# Patient Record
Sex: Male | Born: 1950 | Race: White | Hispanic: No | Smoking: Never smoker
Health system: Southern US, Community
[De-identification: ages and names within clinical notes are randomized; demographics above are authoritative.]

## PROBLEM LIST (undated history)

## (undated) DIAGNOSIS — I509 Heart failure, unspecified: Secondary | ICD-10-CM

## (undated) DIAGNOSIS — N62 Hypertrophy of breast: Secondary | ICD-10-CM

## (undated) DIAGNOSIS — R945 Abnormal results of liver function studies: Secondary | ICD-10-CM

## (undated) DIAGNOSIS — M7580 Other shoulder lesions, unspecified shoulder: Secondary | ICD-10-CM

## (undated) DIAGNOSIS — H269 Unspecified cataract: Secondary | ICD-10-CM

## (undated) DIAGNOSIS — M1710 Unilateral primary osteoarthritis, unspecified knee: Secondary | ICD-10-CM

## (undated) DIAGNOSIS — T7840XA Allergy, unspecified, initial encounter: Secondary | ICD-10-CM

## (undated) DIAGNOSIS — K219 Gastro-esophageal reflux disease without esophagitis: Secondary | ICD-10-CM

## (undated) DIAGNOSIS — R7989 Other specified abnormal findings of blood chemistry: Secondary | ICD-10-CM

## (undated) DIAGNOSIS — E781 Pure hyperglyceridemia: Secondary | ICD-10-CM

## (undated) DIAGNOSIS — F419 Anxiety disorder, unspecified: Secondary | ICD-10-CM

## (undated) DIAGNOSIS — Z9889 Other specified postprocedural states: Secondary | ICD-10-CM

## (undated) DIAGNOSIS — J45909 Unspecified asthma, uncomplicated: Secondary | ICD-10-CM

## (undated) DIAGNOSIS — M179 Osteoarthritis of knee, unspecified: Secondary | ICD-10-CM

## (undated) DIAGNOSIS — G4730 Sleep apnea, unspecified: Secondary | ICD-10-CM

## (undated) DIAGNOSIS — I428 Other cardiomyopathies: Secondary | ICD-10-CM

## (undated) DIAGNOSIS — I10 Essential (primary) hypertension: Secondary | ICD-10-CM

## (undated) DIAGNOSIS — N189 Chronic kidney disease, unspecified: Secondary | ICD-10-CM

## (undated) DIAGNOSIS — K5790 Diverticulosis of intestine, part unspecified, without perforation or abscess without bleeding: Secondary | ICD-10-CM

## (undated) HISTORY — DX: Essential (primary) hypertension: I10

## (undated) HISTORY — DX: Unspecified cataract: H26.9

## (undated) HISTORY — DX: Unspecified asthma, uncomplicated: J45.909

## (undated) HISTORY — DX: Allergy, unspecified, initial encounter: T78.40XA

## (undated) HISTORY — DX: Pure hyperglyceridemia: E78.1

## (undated) HISTORY — DX: Abnormal results of liver function studies: R94.5

## (undated) HISTORY — PX: PTCA: SHX146

## (undated) HISTORY — DX: Other specified postprocedural states: Z98.890

## (undated) HISTORY — PX: KNEE ARTHROSCOPY: SUR90

## (undated) HISTORY — PX: MEDIAL PARTIAL KNEE REPLACEMENT: SHX5965

## (undated) HISTORY — DX: Other shoulder lesions, unspecified shoulder: M75.80

## (undated) HISTORY — DX: Gastro-esophageal reflux disease without esophagitis: K21.9

## (undated) HISTORY — DX: Unilateral primary osteoarthritis, unspecified knee: M17.10

## (undated) HISTORY — DX: Chronic kidney disease, unspecified: N18.9

## (undated) HISTORY — PX: ROTATOR CUFF REPAIR: SHX139

## (undated) HISTORY — DX: Hypertrophy of breast: N62

## (undated) HISTORY — DX: Other specified abnormal findings of blood chemistry: R79.89

## (undated) HISTORY — DX: Diverticulosis of intestine, part unspecified, without perforation or abscess without bleeding: K57.90

## (undated) HISTORY — DX: Other cardiomyopathies: I42.8

## (undated) HISTORY — DX: Osteoarthritis of knee, unspecified: M17.9

---

## 1999-03-19 ENCOUNTER — Encounter: Payer: Self-pay | Admitting: Family Medicine

## 1999-03-19 ENCOUNTER — Emergency Department (HOSPITAL_COMMUNITY): Admission: EM | Admit: 1999-03-19 | Discharge: 1999-03-19 | Payer: Self-pay | Admitting: Family Medicine

## 2002-02-13 ENCOUNTER — Emergency Department (HOSPITAL_COMMUNITY): Admission: EM | Admit: 2002-02-13 | Discharge: 2002-02-14 | Payer: Self-pay | Admitting: Emergency Medicine

## 2002-02-14 ENCOUNTER — Encounter: Payer: Self-pay | Admitting: Emergency Medicine

## 2002-06-21 ENCOUNTER — Inpatient Hospital Stay (HOSPITAL_COMMUNITY): Admission: EM | Admit: 2002-06-21 | Discharge: 2002-06-24 | Payer: Self-pay | Admitting: Emergency Medicine

## 2002-06-21 ENCOUNTER — Encounter: Payer: Self-pay | Admitting: Cardiology

## 2003-05-21 ENCOUNTER — Ambulatory Visit (HOSPITAL_BASED_OUTPATIENT_CLINIC_OR_DEPARTMENT_OTHER): Admission: RE | Admit: 2003-05-21 | Discharge: 2003-05-21 | Payer: Self-pay | Admitting: Orthopedic Surgery

## 2003-07-04 ENCOUNTER — Emergency Department (HOSPITAL_COMMUNITY): Admission: EM | Admit: 2003-07-04 | Discharge: 2003-07-04 | Payer: Self-pay | Admitting: Emergency Medicine

## 2004-02-10 ENCOUNTER — Ambulatory Visit: Payer: Self-pay | Admitting: Endocrinology

## 2004-06-23 ENCOUNTER — Ambulatory Visit: Payer: Self-pay | Admitting: Endocrinology

## 2004-08-18 ENCOUNTER — Ambulatory Visit: Payer: Self-pay | Admitting: Internal Medicine

## 2004-09-16 ENCOUNTER — Ambulatory Visit: Payer: Self-pay | Admitting: Cardiology

## 2004-09-21 ENCOUNTER — Ambulatory Visit: Payer: Self-pay | Admitting: Internal Medicine

## 2004-10-03 ENCOUNTER — Ambulatory Visit: Payer: Self-pay

## 2005-08-30 ENCOUNTER — Ambulatory Visit: Payer: Self-pay | Admitting: Cardiology

## 2005-09-13 ENCOUNTER — Ambulatory Visit (HOSPITAL_COMMUNITY): Admission: RE | Admit: 2005-09-13 | Discharge: 2005-09-14 | Payer: Self-pay | Admitting: Orthopedic Surgery

## 2005-12-05 ENCOUNTER — Ambulatory Visit: Payer: Self-pay | Admitting: Cardiology

## 2005-12-15 ENCOUNTER — Ambulatory Visit: Payer: Self-pay

## 2005-12-15 ENCOUNTER — Encounter: Payer: Self-pay | Admitting: Cardiology

## 2005-12-15 HISTORY — PX: OTHER SURGICAL HISTORY: SHX169

## 2006-01-02 ENCOUNTER — Ambulatory Visit: Payer: Self-pay | Admitting: Cardiology

## 2006-06-12 ENCOUNTER — Ambulatory Visit: Payer: Self-pay | Admitting: Endocrinology

## 2006-06-21 ENCOUNTER — Ambulatory Visit: Payer: Self-pay | Admitting: Cardiology

## 2006-07-13 ENCOUNTER — Ambulatory Visit: Payer: Self-pay

## 2006-07-13 ENCOUNTER — Encounter: Payer: Self-pay | Admitting: Cardiology

## 2006-07-13 HISTORY — PX: TRANSTHORACIC ECHOCARDIOGRAM: SHX275

## 2006-07-30 ENCOUNTER — Ambulatory Visit: Payer: Self-pay | Admitting: Cardiology

## 2006-08-08 ENCOUNTER — Ambulatory Visit: Payer: Self-pay | Admitting: Internal Medicine

## 2006-08-09 ENCOUNTER — Encounter: Payer: Self-pay | Admitting: Internal Medicine

## 2006-09-20 ENCOUNTER — Ambulatory Visit: Payer: Self-pay | Admitting: Cardiology

## 2006-09-20 HISTORY — PX: ELECTROCARDIOGRAM: SHX264

## 2006-10-17 ENCOUNTER — Ambulatory Visit: Payer: Self-pay | Admitting: Cardiology

## 2006-12-25 ENCOUNTER — Encounter: Payer: Self-pay | Admitting: Endocrinology

## 2006-12-25 DIAGNOSIS — R7309 Other abnormal glucose: Secondary | ICD-10-CM

## 2006-12-25 DIAGNOSIS — E785 Hyperlipidemia, unspecified: Secondary | ICD-10-CM

## 2006-12-25 DIAGNOSIS — Z87442 Personal history of urinary calculi: Secondary | ICD-10-CM

## 2006-12-25 DIAGNOSIS — J309 Allergic rhinitis, unspecified: Secondary | ICD-10-CM | POA: Insufficient documentation

## 2006-12-25 DIAGNOSIS — F1020 Alcohol dependence, uncomplicated: Secondary | ICD-10-CM

## 2007-02-20 ENCOUNTER — Ambulatory Visit: Payer: Self-pay | Admitting: Cardiology

## 2007-02-20 LAB — CONVERTED CEMR LAB
Chloride: 108 meq/L (ref 96–112)
Creatinine, Ser: 1.3 mg/dL (ref 0.4–1.5)
Glucose, Bld: 100 mg/dL — ABNORMAL HIGH (ref 70–99)
Sodium: 141 meq/L (ref 135–145)

## 2007-02-27 ENCOUNTER — Ambulatory Visit: Payer: Self-pay

## 2007-03-13 ENCOUNTER — Ambulatory Visit: Payer: Self-pay | Admitting: Cardiology

## 2007-09-01 ENCOUNTER — Emergency Department (HOSPITAL_COMMUNITY): Admission: EM | Admit: 2007-09-01 | Discharge: 2007-09-01 | Payer: Self-pay | Admitting: Emergency Medicine

## 2007-09-12 ENCOUNTER — Encounter: Admission: RE | Admit: 2007-09-12 | Discharge: 2007-09-12 | Payer: Self-pay | Admitting: Sports Medicine

## 2007-11-19 ENCOUNTER — Ambulatory Visit: Payer: Self-pay | Admitting: Cardiology

## 2008-03-03 ENCOUNTER — Ambulatory Visit: Payer: Self-pay | Admitting: Cardiology

## 2008-03-11 DIAGNOSIS — I428 Other cardiomyopathies: Secondary | ICD-10-CM

## 2008-03-11 DIAGNOSIS — I10 Essential (primary) hypertension: Secondary | ICD-10-CM | POA: Insufficient documentation

## 2008-03-17 ENCOUNTER — Ambulatory Visit: Payer: Self-pay | Admitting: Cardiology

## 2008-03-17 LAB — CONVERTED CEMR LAB
AST: 18 units/L (ref 0–37)
Alkaline Phosphatase: 53 units/L (ref 39–117)
Chloride: 106 meq/L (ref 96–112)
Direct LDL: 143.4 mg/dL
Ferritin: 137.5 ng/mL (ref 22.0–322.0)
GFR calc non Af Amer: 82 mL/min
Potassium: 4.2 meq/L (ref 3.5–5.1)
Total Bilirubin: 0.9 mg/dL (ref 0.3–1.2)
Total CHOL/HDL Ratio: 6.3
VLDL: 37 mg/dL (ref 0–40)

## 2008-12-15 ENCOUNTER — Encounter: Payer: Self-pay | Admitting: Cardiology

## 2008-12-29 ENCOUNTER — Ambulatory Visit: Payer: Self-pay | Admitting: Cardiology

## 2008-12-29 DIAGNOSIS — R079 Chest pain, unspecified: Secondary | ICD-10-CM

## 2008-12-29 DIAGNOSIS — E781 Pure hyperglyceridemia: Secondary | ICD-10-CM

## 2009-01-01 LAB — CONVERTED CEMR LAB
Albumin: 3.9 g/dL (ref 3.5–5.2)
Alkaline Phosphatase: 69 units/L (ref 39–117)
BUN: 13 mg/dL (ref 6–23)
Cholesterol: 228 mg/dL — ABNORMAL HIGH (ref 0–200)
GFR calc non Af Amer: 106.2 mL/min (ref >60)
Potassium: 4 meq/L (ref 3.5–5.1)
Sodium: 136 meq/L (ref 135–145)
Total Protein: 7.1 g/dL (ref 6.0–8.3)
VLDL: 294 mg/dL — ABNORMAL HIGH (ref 0.0–40.0)

## 2009-01-06 ENCOUNTER — Ambulatory Visit: Payer: Self-pay

## 2009-01-06 ENCOUNTER — Encounter: Payer: Self-pay | Admitting: Cardiology

## 2009-01-11 ENCOUNTER — Telehealth (INDEPENDENT_AMBULATORY_CARE_PROVIDER_SITE_OTHER): Payer: Self-pay | Admitting: *Deleted

## 2009-01-12 ENCOUNTER — Ambulatory Visit: Payer: Self-pay | Admitting: Cardiology

## 2009-01-12 LAB — CONVERTED CEMR LAB
BUN: 12 mg/dL (ref 6–23)
Calcium: 8.9 mg/dL (ref 8.4–10.5)
Creatinine, Ser: 1.3 mg/dL (ref 0.4–1.5)
GFR calc non Af Amer: 60.64 mL/min (ref >60)
Potassium: 4.3 meq/L (ref 3.5–5.1)

## 2009-01-14 ENCOUNTER — Encounter: Payer: Self-pay | Admitting: Cardiology

## 2009-02-10 ENCOUNTER — Telehealth: Payer: Self-pay | Admitting: Cardiology

## 2009-03-08 ENCOUNTER — Encounter: Payer: Self-pay | Admitting: Cardiology

## 2009-05-11 ENCOUNTER — Ambulatory Visit: Payer: Self-pay | Admitting: Cardiology

## 2009-05-11 DIAGNOSIS — R05 Cough: Secondary | ICD-10-CM

## 2009-05-11 DIAGNOSIS — R059 Cough, unspecified: Secondary | ICD-10-CM | POA: Insufficient documentation

## 2009-05-12 ENCOUNTER — Telehealth: Payer: Self-pay | Admitting: Cardiology

## 2009-05-12 LAB — CONVERTED CEMR LAB
Basophils Absolute: 0 10*3/uL (ref 0.0–0.1)
Hemoglobin: 14 g/dL (ref 13.0–17.0)
Lymphocytes Relative: 40.1 % (ref 12.0–46.0)
Monocytes Relative: 10.2 % (ref 3.0–12.0)
Neutrophils Relative %: 45.1 % (ref 43.0–77.0)
Platelets: 121 10*3/uL — ABNORMAL LOW (ref 150.0–400.0)
Pro B Natriuretic peptide (BNP): 27 pg/mL (ref 0.0–100.0)
RDW: 13.1 % (ref 11.5–14.6)

## 2009-08-09 ENCOUNTER — Ambulatory Visit: Payer: Self-pay | Admitting: Cardiology

## 2009-08-12 LAB — CONVERTED CEMR LAB
Albumin: 3.9 g/dL (ref 3.5–5.2)
CO2: 29 meq/L (ref 19–32)
Chloride: 106 meq/L (ref 96–112)
Cholesterol: 192 mg/dL (ref 0–200)
Direct LDL: 108.3 mg/dL
HDL: 34.1 mg/dL — ABNORMAL LOW (ref >39.00)
Potassium: 4.6 meq/L (ref 3.5–5.1)
Sodium: 139 meq/L (ref 135–145)
Total Bilirubin: 0.9 mg/dL (ref 0.3–1.2)
Total CHOL/HDL Ratio: 6
Triglycerides: 238 mg/dL — ABNORMAL HIGH (ref 0.0–149.0)

## 2009-08-17 ENCOUNTER — Ambulatory Visit: Payer: Self-pay | Admitting: Cardiology

## 2009-08-17 ENCOUNTER — Ambulatory Visit: Payer: Self-pay

## 2009-08-17 ENCOUNTER — Encounter (HOSPITAL_COMMUNITY): Admission: RE | Admit: 2009-08-17 | Discharge: 2009-10-08 | Payer: Self-pay | Admitting: Cardiology

## 2009-08-20 ENCOUNTER — Telehealth: Payer: Self-pay | Admitting: Cardiology

## 2009-09-17 ENCOUNTER — Ambulatory Visit: Payer: Self-pay | Admitting: Internal Medicine

## 2009-11-04 ENCOUNTER — Telehealth: Payer: Self-pay | Admitting: Cardiology

## 2009-11-24 ENCOUNTER — Encounter: Payer: Self-pay | Admitting: Gastroenterology

## 2009-11-24 ENCOUNTER — Ambulatory Visit: Payer: Self-pay | Admitting: Cardiology

## 2009-11-24 DIAGNOSIS — K625 Hemorrhage of anus and rectum: Secondary | ICD-10-CM

## 2009-11-24 DIAGNOSIS — N63 Unspecified lump in unspecified breast: Secondary | ICD-10-CM

## 2009-11-29 LAB — CONVERTED CEMR LAB
CO2: 27 meq/L (ref 19–32)
Calcium: 9.5 mg/dL (ref 8.4–10.5)
Chloride: 104 meq/L (ref 96–112)
Pro B Natriuretic peptide (BNP): 12.6 pg/mL (ref 0.0–100.0)
Sodium: 139 meq/L (ref 135–145)

## 2009-12-02 ENCOUNTER — Encounter: Payer: Self-pay | Admitting: Cardiology

## 2009-12-07 ENCOUNTER — Ambulatory Visit: Payer: Self-pay | Admitting: Cardiology

## 2009-12-09 ENCOUNTER — Telehealth: Payer: Self-pay | Admitting: Cardiology

## 2009-12-13 LAB — CONVERTED CEMR LAB
CO2: 25 meq/L (ref 19–32)
Calcium: 9.1 mg/dL (ref 8.4–10.5)
GFR calc non Af Amer: 62.09 mL/min (ref >60)
Glucose, Bld: 86 mg/dL (ref 70–99)
Potassium: 4.7 meq/L (ref 3.5–5.1)
Sodium: 137 meq/L (ref 135–145)

## 2010-01-10 ENCOUNTER — Ambulatory Visit: Payer: Self-pay | Admitting: Cardiology

## 2010-01-11 ENCOUNTER — Encounter: Payer: Self-pay | Admitting: Cardiology

## 2010-01-11 ENCOUNTER — Telehealth: Payer: Self-pay | Admitting: Cardiology

## 2010-01-14 ENCOUNTER — Ambulatory Visit: Payer: Self-pay | Admitting: Gastroenterology

## 2010-01-14 ENCOUNTER — Encounter (INDEPENDENT_AMBULATORY_CARE_PROVIDER_SITE_OTHER): Payer: Self-pay | Admitting: *Deleted

## 2010-01-14 DIAGNOSIS — R12 Heartburn: Secondary | ICD-10-CM

## 2010-01-14 DIAGNOSIS — K620 Anal polyp: Secondary | ICD-10-CM | POA: Insufficient documentation

## 2010-01-14 DIAGNOSIS — K621 Rectal polyp: Secondary | ICD-10-CM

## 2010-01-17 ENCOUNTER — Telehealth: Payer: Self-pay | Admitting: Gastroenterology

## 2010-01-17 ENCOUNTER — Encounter (INDEPENDENT_AMBULATORY_CARE_PROVIDER_SITE_OTHER): Payer: Self-pay | Admitting: *Deleted

## 2010-01-17 LAB — CONVERTED CEMR LAB
Basophils Absolute: 0 10*3/uL (ref 0.0–0.1)
Basophils Relative: 0.7 % (ref 0.0–3.0)
Eosinophils Relative: 2.7 % (ref 0.0–5.0)
HCT: 38 % — ABNORMAL LOW (ref 39.0–52.0)
Hemoglobin: 13.3 g/dL (ref 13.0–17.0)
Lymphocytes Relative: 35.4 % (ref 12.0–46.0)
Lymphs Abs: 2.1 10*3/uL (ref 0.7–4.0)
Monocytes Relative: 10.9 % (ref 3.0–12.0)
Neutro Abs: 2.9 10*3/uL (ref 1.4–7.7)
RBC: 3.89 M/uL — ABNORMAL LOW (ref 4.22–5.81)
RDW: 13.3 % (ref 11.5–14.6)

## 2010-01-24 ENCOUNTER — Ambulatory Visit: Payer: Self-pay | Admitting: Cardiology

## 2010-02-02 ENCOUNTER — Telehealth: Payer: Self-pay | Admitting: Cardiology

## 2010-02-09 ENCOUNTER — Encounter: Payer: Self-pay | Admitting: Cardiology

## 2010-02-22 ENCOUNTER — Telehealth: Payer: Self-pay | Admitting: Cardiology

## 2010-02-28 ENCOUNTER — Telehealth: Payer: Self-pay | Admitting: Cardiology

## 2010-03-25 ENCOUNTER — Ambulatory Visit: Payer: Self-pay | Admitting: Cardiology

## 2010-04-22 ENCOUNTER — Telehealth: Payer: Self-pay | Admitting: Gastroenterology

## 2010-04-25 ENCOUNTER — Encounter: Payer: Self-pay | Admitting: Gastroenterology

## 2010-04-25 ENCOUNTER — Telehealth: Payer: Self-pay | Admitting: Cardiology

## 2010-04-25 ENCOUNTER — Other Ambulatory Visit: Payer: Self-pay | Admitting: Gastroenterology

## 2010-04-25 ENCOUNTER — Ambulatory Visit
Admission: RE | Admit: 2010-04-25 | Discharge: 2010-04-25 | Payer: Self-pay | Source: Home / Self Care | Attending: Gastroenterology | Admitting: Gastroenterology

## 2010-04-25 DIAGNOSIS — K2970 Gastritis, unspecified, without bleeding: Secondary | ICD-10-CM

## 2010-04-25 DIAGNOSIS — K2990 Gastroduodenitis, unspecified, without bleeding: Secondary | ICD-10-CM

## 2010-04-26 ENCOUNTER — Telehealth: Payer: Self-pay | Admitting: Gastroenterology

## 2010-05-01 ENCOUNTER — Encounter: Payer: Self-pay | Admitting: Internal Medicine

## 2010-05-03 ENCOUNTER — Telehealth: Payer: Self-pay | Admitting: Gastroenterology

## 2010-05-04 ENCOUNTER — Telehealth: Payer: Self-pay | Admitting: Gastroenterology

## 2010-05-05 ENCOUNTER — Telehealth: Payer: Self-pay | Admitting: Gastroenterology

## 2010-05-05 ENCOUNTER — Telehealth: Payer: Self-pay | Admitting: Cardiology

## 2010-05-10 NOTE — Assessment & Plan Note (Signed)
Summary: rov  Medications Added LOVAZA 1 GM CAPS (OMEGA-3-ACID ETHYL ESTERS) two capsules  twice daily AMBIEN 5 MG TABS (ZOLPIDEM TARTRATE) one hs as needed for sleep EPLERENONE 25 MG TABS (EPLERENONE) one daily      Allergies Added: NKDA  Primary Provider:  None  CC:  Patient still having pain around left nipple once in awhile.  History of Present Illness: 60 yo with history of nonischemic cardiomyopathy presents for followup.  His left breast pain resolved off spironolactone for about a month (gynecomastia).  He continues to have occasional atypical chest pain (like "pin prick", only lasts a few seconds). Most days he is doing well with no shortness of breath.  Some days he will get short of breath walking up a flight of steps but usually has no problem.  He is avoiding sodium.   Labs (12/09): SPEP negative, HIV negative, transferrin saturation 29%, BNP 24 Labs (9/10): direct LDL 54, HDL 13.8, triglycerides 1470, creatinine 0.8 Labs (05/11/09): BNP 27, WBCs 5.6 Labs (5/11): K 4.6, creatinine 1.2, TGs 238, LDL 108, HDL 34 Labs (8/11): K 4.3, creatinine 1.4, BNP 12.6  Current Medications (verified): 1)  Aspirin Ec 325 Mg Tbec (Aspirin) .... Take One Tablet By Mouth Daily 2)  Lisinopril 20 Mg Tabs (Lisinopril) .... One Twice A Day 3)  Carvedilol 12.5 Mg Tabs (Carvedilol) .... Take One Tablet By Mouth Twice A Day 4)  Lovaza 1 Gm Caps (Omega-3-Acid Ethyl Esters) .... Two Capsules  Twice Daily 5)  Multivitamins   Tabs (Multiple Vitamin) .Marland Kitchen.. 1 Tab Once Daily  Allergies (verified): No Known Drug Allergies  Past History:  Past Medical History: 1. Nonischemic cardiomyopathy.  The patient had a left heart catheterization done in March 2004 with normal coronaries and then in November 2008, he had a Myoview done that showed an EF of 39% with no ischemia.  Echo in April 2008 showed an EF of 35-40%, moderate diffuse hypokinesis, mild left atrial enlargement, normal RV size and function and  essentially normal valves.  The cause of his cardiomyopathy has not been discovered. He has never been a heavy drinker.  He has never used cocaine and amphetamines.  HIV, SPEP, and ferritin/Fe studies were all negative in 12/09.  Echo (9/10): EF 40%, mild to moderate global HK, mild LAE.  Myoview (5/11): EF 42% with apical thinning but no evidence for ischemia or infarction.  2. Hypertriglyceridemia 3. Rotator cuff tendonitis with a history of bilateral shoulder surgery. 4. Hypertension. 5. History of a back surgery. 6. Knee osteoarthritis, s/p arthroscopy 2010 7. Gastroesophageal reflux disease.  8. Allergic rhinitis 9. Gynecomastia with spironolactone  Family History: Reviewed history from 11/24/2009 and no changes required. No premature CAD.  Father with prostate cancer Mother with CVA Brother with nonischemic cardiomyopathy, has ICD Grandmother with cancer (unsure what)  Social History: Reviewed history from 05/11/2009 and no changes required. The patient denies smoking.  He does not use any drugs. He has never used any illicit drugs other than marijuana, which he smoked occasionally in the distant past.  He rarely drinks alcohol and was never heavy drinker. He is on disability. He is divorced with one son.   Review of Systems       All systems reviewed and negative except as per HPI.   Vital Signs:  Patient profile:   60 year old male Height:      67.5 inches Weight:      189.75 pounds BMI:     29.39 Pulse rate:  55 / minute Pulse rhythm:   regular Resp:     18 per minute BP sitting:   110 / 78  (left arm) Cuff size:   large  Vitals Entered By: Vikki Ports (January 10, 2010 2:50 PM)  Physical Exam  General:  Well developed, well nourished, in no acute distress. Neck:  Neck supple, no JVD. No masses, thyromegaly or abnormal cervical nodes. Lungs:  Clear bilaterally to auscultation and percussion. Heart:  Non-displaced PMI, chest non-tender; regular rate and rhythm,  S1, S2 without murmurs, rubs or gallops. Carotid upstroke normal, no bruit.  Pedals normal pulses. 1+ ankle edema.  Abdomen:  Bowel sounds positive; abdomen soft and non-tender without masses, organomegaly, or hernias noted. No hepatosplenomegaly. Extremities:  No clubbing or cyanosis. Neurologic:  Alert and oriented x 3. Psych:  Normal affect.   Impression & Recommendations:  Problem # 1:  CARDIOMYOPATHY (ICD-425.4) Nonischemic cardiomyopathy, EF 40%.  Cause is not certain.  No heavy substance abuse, HIV negative, SPEP negative, no evidence for hemochromatosis.  This may be familial as his brother apparently also has a nonischemic cardiomyopathy and recently got an ICD.  He appears euvolemic on exam.  He seems to be NYHA class II symptomatically for the most part, though he occasionally gets short of breath with climbing steps.  He will continue Coreg and lisinopril at current doses.  He had gynecomastia with spironolactone that resolved after stopping the medication.  I will try him on eplerenone 25 mg daily (less risk of gynecomastia than spironolactone) with BMET in 2 wks.  I will repeat echo in 3/12.   I did give the patient a prescription for Ambien 5 mg daily for aid with insomnia.   Other Orders: Echocardiogram (Echo)  Patient Instructions: 1)  Your physician has recommended you make the following change in your medication:  2)  Start Eplerenone 25mg  daily. 3)  Use Ambien 5mg   as needed for sleep--you should not use more than 14 per month. 4)  Lab 2 weeks after starting Eplerenone--BMP 428.22 5)  Your physician wants you to follow-up in: 4 months with Dr Shirlee Latch.   You will receive a reminder letter in the mail two months in advance. If you don't receive a letter, please call our office to schedule the follow-up appointment. 6)  Your physician has requested that you have an echocardiogram.  Echocardiography is a painless test that uses sound waves to create images of your heart. It  provides your doctor with information about the size and shape of your heart and how well your heart's chambers and valves are working.  This procedure takes approximately one hour. There are no restrictions for this procedure. MAY 2012 Prescriptions: EPLERENONE 25 MG TABS (EPLERENONE) one daily  #90 x 3   Entered by:   Katina Dung, RN, BSN   Authorized by:   Marca Ancona, MD   Signed by:   Katina Dung, RN, BSN on 01/10/2010   Method used:   Print then Give to Patient   RxID:   [id number] CARVEDILOL 12.5 MG TABS (CARVEDILOL) Take one tablet by mouth twice a day  #180 x 3   Entered by:   Katina Dung, RN, BSN   Authorized by:   Marca Ancona, MD   Signed by:   Katina Dung, RN, BSN on 01/10/2010   Method used:   Print then Give to Patient   RxID:   [id number] LOVAZA 1 GM CAPS (OMEGA-3-ACID ETHYL ESTERS) two capsules  twice daily  #  360 x 3   Entered by:   Katina Dung, RN, BSN   Authorized by:   Marca Ancona, MD   Signed by:   Katina Dung, RN, BSN on 01/10/2010   Method used:   Print then Give to Patient   RxID:   [id number] EPLERENONE 25 MG TABS (EPLERENONE) one daily  #90 x 3   Entered by:   Katina Dung, RN, BSN   Authorized by:   Marca Ancona, MD   Signed by:   Katina Dung, RN, BSN on 01/10/2010   Method used:   Electronically to        [facility] (retail)       [address]       [place], [place]  [address]       Ph: [phone]       Fax: [phone]   RxID:   [id number] EPLERENONE 25 MG TABS (EPLERENONE) one daily  #30 x 11   Entered by:   Katina Dung, RN, BSN   Authorized by:   Marca Ancona, MD   Signed by:   Katina Dung, RN, BSN on 01/10/2010   Method used:   Electronically to        [facility] (retail)       [address]       [place], [place]  [address]       Ph: [phone]       Fax: [phone]   RxID:   [id number] EPLERENONE 25 MG  TABS (EPLERENONE) one daily  #30 x 11   Entered by:   Katina Dung, RN, BSN   Authorized by:   Marca Ancona, MD   Signed by:   Katina Dung, RN, BSN on 01/10/2010   Method used:   Print then Give to Patient   RxID:   [id number] AMBIEN 5 MG TABS (ZOLPIDEM TARTRATE) one hs as needed for sleep  #15 x 3   Entered by:   Katina Dung, RN, BSN   Authorized by:   Marca Ancona, MD   Signed by:   Katina Dung, RN, BSN on 01/10/2010   Method used:   Print then Give to Patient   RxID:   [id number]

## 2010-05-10 NOTE — Letter (Signed)
Summary: [facility] ([place]) Instructions  [facility]  [address] [place], [place] [address]   Phone: [phone]  Fax: [phone]       [name] [name]    20-Jul-1950    MRN: [id number]       Procedure Day /Date:02/07/10 MON     Arrival Time:2 pm     Procedure Time:3 pm    Location of Procedure:                    X  [facility] (4th Floor)   PREPARATION FOR COLONOSCOPY WITH MIRALAX  Starting 5 days prior to your procedure 02/02/10 do not eat nuts, seeds, popcorn, corn, beans, peas,  salads, or any raw vegetables.  Do not take any fiber supplements (e.g. Metamucil, Citrucel, and Benefiber). ____________________________________________________________________________________________________   THE DAY BEFORE YOUR PROCEDURE         DATE: 02/06/10  DAY: SUN  1   Drink clear liquids the entire day-NO SOLID FOOD  2   Do not drink anything colored red or purple.  Avoid juices with pulp.  No orange juice.  3   Drink at least 64 oz. (8 glasses) of fluid/clear liquids during the day to prevent dehydration and help the prep work efficiently.  CLEAR LIQUIDS INCLUDE: Water Jello Ice Popsicles Tea (sugar ok, no milk/cream) Powdered fruit flavored drinks Coffee (sugar ok, no milk/cream) Gatorade Juice: apple, white grape, white cranberry  Lemonade Clear bullion, consomm, broth Carbonated beverages (any kind) Strained chicken noodle soup Hard Candy  4   Mix the entire bottle of Miralax with 64 oz. of Gatorade/Powerade in the morning and put in the refrigerator to chill.  5   At 3:00 pm take 2 Dulcolax/Bisacodyl tablets.  6   At 4:30 pm take one Reglan/Metoclopramide tablet.  7  Starting at 5:00 pm drink one 8 oz glass of the Miralax mixture every 15-20 minutes until you have finished drinking the entire 64 oz.  You should finish drinking prep around 7:30 or 8:00 pm.  8   If you are nauseated, you may take the 2nd Reglan/Metoclopramide tablet at 6:30 pm.       9    At 8:00 pm take 2 more DULCOLAX/Bisacodyl tablets.     THE DAY OF YOUR PROCEDURE      DATE:  02/07/10 DAY: MON  You may drink clear liquids until 1 pm  (2 HOURS BEFORE PROCEDURE).   MEDICATION INSTRUCTIONS  Unless otherwise instructed, you should take regular prescription medications with a small sip of water as early as possible the morning of your procedure.           OTHER INSTRUCTIONS  You will need a responsible adult at least 58 years of age to accompany you and drive you home.   This person must remain in the waiting room during your procedure.  Wear loose fitting clothing that is easily removed.  Leave jewelry and other valuables at home.  However, you may wish to bring a book to read or an iPod/MP3 player to listen to music as you wait for your procedure to start.  Remove all body piercing jewelry and leave at home.  Total time from sign-in until discharge is approximately 2-3 hours.  You should go home directly after your procedure and rest.  You can resume normal activities the day after your procedure.  The day of your procedure you should not:   Drive   Make legal decisions   Operate  machinery   Drink alcohol   Return to work  You will receive specific instructions about eating, activities and medications before you leave.   The above instructions have been reviewed and explained to me by   _______________________    I fully understand and can verbalize these instructions _____________________________ Date _______

## 2010-05-10 NOTE — Progress Notes (Signed)
Summary: Medication  Phone Note Call from Patient Call back at Home Phone [phone]   Caller: Patient Call For: Dr. Christella Hartigan Reason for Call: Talk to Nurse Summary of Call: pt. cannot afford the Moviprep...needs an alternative or a sample Initial call taken by: Karna Christmas,  January 17, 2010 11:19 AM  Follow-up for Phone Call        left message on machine to call back Chales Abrahams CMA Duncan Dull)  January 17, 2010 11:29 AM   pt returned call he will be given Miralax prep, he will come in on Wed for new instructions  Follow-up by: Chales Abrahams CMA Duncan Dull),  January 17, 2010 4:39 PM     Appended Document: Medication prep changed to miralax due to cost   Clinical Lists Changes  Medications: Removed medication of MOVIPREP 100 GM  SOLR (PEG-KCL-NACL-NASULF-NA ASC-C) As per prep instructions. Added new medication of MIRALAX   POWD (POLYETHYLENE GLYCOL 3350) As per prep  instructions. - Signed Added new medication of REGLAN 10 MG  TABS (METOCLOPRAMIDE HCL) As per prep instructions. - Signed Added new medication of DULCOLAX 5 MG  TBEC (BISACODYL) Day before procedure take 2 at 3pm and 2 at 8pm. - Signed Rx of MIRALAX   POWD (POLYETHYLENE GLYCOL 3350) As per prep  instructions.;  #255gm x 0;  Signed;  Entered by: Chales Abrahams CMA (AAMA);  Authorized by: Rachael Fee MD;  Method used: Electronically to [facility] [address], [place], [place], [place]  [address], Ph: [phone], Fax: [phone] Rx of REGLAN 10 MG  TABS (METOCLOPRAMIDE HCL) As per prep instructions.;  #2 x 0;  Signed;  Entered by: Chales Abrahams CMA (AAMA);  Authorized by: Rachael Fee MD;  Method used: Electronically to [facility] [address] [place], [place], [place]  [address], Ph: [phone], Fax: [phone] Rx of DULCOLAX 5 MG  TBEC (BISACODYL) Day before procedure take 2 at 3pm and 2 at 8pm.;  #4 x 0;  Signed;  Entered by: Chales Abrahams CMA (AAMA);  Authorized  by: Rachael Fee MD;  Method used: Electronically to [facility] [address], [place], [place], [place]  [address], Ph: [phone], Fax: [phone]    Prescriptions: DULCOLAX 5 MG  TBEC (BISACODYL) Day before procedure take 2 at 3pm and 2 at 8pm.  #4 x 0   Entered by:   Chales Abrahams CMA (AAMA)   Authorized by:   Rachael Fee MD   Signed by:   Chales Abrahams CMA (AAMA) on 01/17/2010   Method used:   Electronically to        [facility] (retail)       [address]       [place], [place]  [address]       Ph: [phone]       Fax: [phone]   RxID:   [id number] REGLAN 10 MG  TABS (METOCLOPRAMIDE HCL) As per prep instructions.  #2 x 0   Entered by:   Chales Abrahams CMA (AAMA)   Authorized by:   Rachael Fee MD   Signed by:   Chales Abrahams CMA (AAMA) on 01/17/2010   Method used:   Electronically to        [facility] (retail)       [address]       [place]  [place], [place]  [address]       Ph: [phone]       Fax: [phone]   RxID:   [id number] MIRALAX   POWD (POLYETHYLENE GLYCOL 3350) As per prep  instructions.  #255gm x 0   Entered by:   Chales Abrahams CMA (AAMA)   Authorized by:   Rachael Fee MD   Signed by:   Chales Abrahams CMA (AAMA) on 01/17/2010   Method used:   Electronically to        [facility] (retail)       [address]       [place], [place]  [address]       Ph: [phone]       Fax: [phone]   RxID:   [id number]    Appended Document: Medication pt is having car trouble and is in [place] and request the new instructions be mailed to him, they will be mailed and were instructed over the phone he will call with any questions after recieving

## 2010-05-10 NOTE — Progress Notes (Signed)
        Additional Follow-up for Phone Call Additional follow up Details #2::    Gynecomastia on mammogram, probably caused by spironolactone.  Stop spironolactone.  Followup in 10/11.  If resolved, will try him on eplerenone.    Appended Document: mammogram results discussed with pt by telephone --appt made with Dr Shirlee Latch for 01/10/10   Clinical Lists Changes  Medications: Removed medication of SPIRONOLACTONE 25 MG TABS (SPIRONOLACTONE) one  daily

## 2010-05-10 NOTE — Assessment & Plan Note (Signed)
Summary: pt still having [pains around his nipple area/hands falling a...  Medications Added LISINOPRIL 20 MG TABS (LISINOPRIL) one twice a day MULTIVITAMINS   TABS (MULTIPLE VITAMIN) 1 tab once daily      Allergies Added: NKDA  Visit Type:  rov Primary Provider:  None  CC:  pt states he has had pain around his left nipple x 8 wks...also states he has had bilateral arm numbness when he sleeps also states he had a pain in his left forearm from his posterior side of wrist all the way up to his brachial area this happened this past Sunday while he was @ church...sob....denies any edema.  History of Present Illness: 60 yo with history of nonischemic cardiomyopathy presents for followup.  At last visit, patient was having atypical chest pain.  Myoview showed no evidence for ischemia and stable EF 42%.  Main complaint today is 6-8 weeks of pain in his left breast.  It is tender around the nipple and he thinks he can feel a lump.  There is no drainage from the nipple and no skin problems around the nipple.  He continues to get occasional fleeting chest pain (sharp, lasts for 1-2 seconds).  Sometimes he will wake up in the morning with tingling in his hands.  He has occasional dypsnea when walking fast up a flight of steps.  However, he walks 3-4 times a week for about 2 miles without problems, and he sometimes goes dancing on the weekends with no problems.  He also has noted some bright red blood per rectum.  He often notes this around his bowel movement.  He had his girlfriend check and no external hemorrhoids were seen.  He has never had colon cancer screening.    ECG: NSR, normal  Labs (12/09): SPEP negative, HIV negative, transferrin saturation 29%, BNP 24 Labs (9/10): direct LDL 54, HDL 13.8, triglycerides 1470, creatinine 0.8 Labs (05/11/09): BNP 27, WBCs 5.6 Labs (5/11): K 4.6, creatinine 1.2, TGs 238, LDL 108, HDL 34  Current Medications (verified): 1)  Aspirin Ec 325 Mg Tbec (Aspirin) ....  Take One Tablet By Mouth Daily 2)  Lisinopril 10 Mg Tabs (Lisinopril) .Marland Kitchen.. 1 1/2  Twice A Day 3)  Carvedilol 12.5 Mg Tabs (Carvedilol) .... Take One Tablet By Mouth Twice A Day 4)  Lovaza 1 Gm Caps (Omega-3-Acid Ethyl Esters) .... Two Capsules  Twice Daily 5)  Spironolactone 25 Mg Tabs (Spironolactone) .... One  Daily 6)  Multivitamins   Tabs (Multiple Vitamin) .Marland Kitchen.. 1 Tab Once Daily  Allergies (verified): No Known Drug Allergies  Past History:  Past Medical History: 1. Nonischemic cardiomyopathy.  The patient had a left heart catheterization done in March 2004 with normal coronaries and then in November 2008, he had a Myoview done that showed an EF of 39% with no ischemia.  Echo in April 2008 showed an EF of 35-40%, moderate diffuse hypokinesis, mild left atrial enlargement, normal RV size and function and essentially normal valves.  The cause of his cardiomyopathy has not been discovered. He has never been a heavy drinker.  He has never used cocaine and amphetamines.  HIV, SPEP, and ferritin/Fe studies were all negative in 12/09.  Echo (9/10): EF 40%, mild to moderate global HK, mild LAE.  Myoview (5/11): EF 42% with apical thinning but no evidence for ischemia or infarction.  2. Hypertriglyceridemia 3. Rotator cuff tendonitis with a history of bilateral shoulder surgery. 4. Hypertension. 5. History of a back surgery. 6. Knee osteoarthritis, s/p arthroscopy  2010 7. Gastroesophageal reflux disease.  8. Allergic rhinitis  Family History: No premature CAD.  Father with prostate cancer Mother with CVA Brother with nonischemic cardiomyopathy, has ICD Grandmother with cancer (unsure what)  Social History: Reviewed history from 05/11/2009 and no changes required. The patient denies smoking.  He does not use any drugs. He has never used any illicit drugs other than marijuana, which he smoked occasionally in the distant past.  He rarely drinks alcohol and was never heavy drinker. He is on  disability. He is divorced with one son.   Review of Systems       All systems reviewed and negative except as per HPI.   Vital Signs:  Patient profile:   60 year old male Height:      67.5 inches Weight:      185 pounds BMI:     28.65 Pulse rate:   66 / minute Pulse rhythm:   regular BP sitting:   108 / 72  (left arm) Cuff size:   regular  Vitals Entered By: Danielle Rankin, CMA (November 24, 2009 9:34 AM)  Physical Exam  General:  Well developed, well nourished, in no acute distress. Neck:  Neck supple, no JVD. No masses, thyromegaly or abnormal cervical nodes. Breasts:  Left nipple with no drainage, no skin lesions over breast, tenderness to palpation of tissue around nipple, I am unable to feel a mass.  Lungs:  Clear bilaterally to auscultation and percussion. Heart:  Non-displaced PMI, chest non-tender; regular rate and rhythm, S1, S2 without murmurs, rubs or gallops. Carotid upstroke normal, no bruit. Pedals normal pulses. No edema.  Abdomen:  Bowel sounds positive; abdomen soft and non-tender without masses, organomegaly, or hernias noted. No hepatosplenomegaly. Extremities:  No clubbing or cyanosis. Neurologic:  Alert and oriented x 3. Psych:  Normal affect.   Impression & Recommendations:  Problem # 1:  CARDIOMYOPATHY (ICD-425.4) Nonischemic cardiomyopathy, EF 40%.  Cause is not certain.  No heavy substance abuse, HIV negative, SPEP negative, no evidence for hemochromatosis.  This may be familial as his brother apparently also has a nonischemic cardiomyopathy and just got an ICD.  He appears euvolemic on exam.  He seems to be NYHA class II symptomatically for the most part, though he occasionally gets short of breath with climbing steps.  He will continue Coreg and spironolactone.  I will increase his lisinopril to 20 mg two times a day today.  I will get BMET and BNP today and repeat BMET in 2 wks with increase in lisinopril.   Problem # 2:  CHEST PAIN-UNSPECIFIED  (ICD-786.50) Patient has occasional very atypical chest pain.  Recent myoview showed no evidence for ischemia.  OK to take ASA 81 mg daily rather than 325 mg daily.   Problem # 3:  BREAST MASS, LEFT (ICD-611.72) Patient is tender around the left nipple.  He can feel a mass but I cannot.  No known history of breast cancer in his family.  He had a negative CXR today.  I will have him get a mammogram to make sure there is no left breast pathology.   Problem # 4:  RECTAL BLEEDING (ICD-569.3) Patient describes bright red blood per rectum that sounds most consistent with hemorrhoids.  His girlfriend did not see any external hemorrhoids, but could be internal.  He has not had colon cancer screening.  I will refer to GI for colonoscopy.   Other Orders: TLB-BMP (Basic Metabolic Panel-BMET) (80048-METABOL) TLB-BNP (B-Natriuretic Peptide) (83880-BNPR) Gastroenterology Referral (GI) Misc.  Referral (Misc. Ref) T-2 View CXR (71020TC)  Patient Instructions: 1)  Your physician has recommended you make the following change in your medication:  2)  Increase Lisinopril to 20mg  two times a day--you can take two 10mg  Lisinopril twice a day. 3)  Your physician recommends that you have lab today--BMP/BNP 428.22 786.50 4)  Your physician recommends that you return for lab work in: 2 weeks --BMP 428.22 786.50 5)  A chest x-ray takes a picture of the organs and structures inside the chest, including the heart, lungs, and blood vessels. This test can show several things, including, whether the heart is enlarged; whether fluid is building up in the lungs; and whether pacemaker / defibrillator leads are still in place. TODAY 6)  Your physician recommends that you schedule a follow-up appointment in: 4 months with Dr Shirlee Latch. 7)  You have been referred to Dr. Christella Hartigan in GI 8)  We will schedule a mammogram. Prescriptions: LISINOPRIL 20 MG TABS (LISINOPRIL) one twice a day  #60 x 11   Entered by:   Katina Dung, RN, BSN    Authorized by:   Marca Ancona, MD   Signed by:   Katina Dung, RN, BSN on 11/24/2009   Method used:   Electronically to        [facility] (retail)       [address]       [place], [place]  [address]       Ph: [phone]       Fax: [phone]   RxID:   [id number]

## 2010-05-10 NOTE — Assessment & Plan Note (Signed)
Summary: Cardiology Nuclear Study  Nuclear Med Background Indications for Stress Test: Evaluation for Ischemia   History: Asthma, COPD, Echo, Heart Catheterization, Myocardial Perfusion Study  History Comments: '04 Cath:normal coronaries, EF=35-40% (NICM); '08 VPX:TGGYIRS fixed apical defect, ? NICM, no ischemia, EF=39%; 9/10 Echo:EF=40%  Symptoms: Chest Pressure, Dizziness, DOE, Light-Headedness  Symptoms Comments: CP>back. Last episode of WN:IOEVOJJKK   Nuclear Pre-Procedure Cardiac Risk Factors: Hypertension, Lipids Caffeine/Decaff Intake: none NPO After: 10:30 PM Lungs: Clear IV 0.9% NS with Angio Cath: 18g     IV Site: (R) AC IV Started by: Stanton Kidney EMT-P Chest Size (in) 42     Height (in): 67.5 Weight (lb): 177 BMI: 27.41 Tech Comments: Coreg held x 12 hours.  Nuclear Med Study 1 or 2 day study:  1 day     Stress Test Type:  Stress Reading MD:  Olga Millers, MD     Referring MD:  Marca Ancona, MD Resting Radionuclide:  Technetium 43m Tetrofosmin     Resting Radionuclide Dose:  11 mCi  Stress Radionuclide:  Technetium 75m Tetrofosmin     Stress Radionuclide Dose:  33 mCi   Stress Protocol Exercise Time (min):  10:30 min     Max HR:  136 bpm     Predicted Max HR:  164 bpm  Max Systolic BP: 145 mm Hg     Percent Max HR:  82.93 %     METS: 12.5 Rate Pressure Product:  93818    Stress Test Technologist:  Rea College CMA-N     Nuclear Technologist:  Domenic Polite CNMT  Rest Procedure  Myocardial perfusion imaging was performed at rest 45 minutes following the intravenous administration of Myoview Technetium 23m Tetrofosmin.  Stress Procedure  The patient exercised for 10:30.  The patient stopped due to fatigue and denied any chest pain.  There were no significant ST-T wave changes, only occasional PVC's.  Myoview was injected at peak exercise and myocardial perfusion imaging was performed after a brief delay.  QPS Raw Data Images:  There is no interference  from other nuclear activity. Stress Images:  There is decreased uptake in the apex. Rest Images:  There is decreased uptake in the apex. Subtraction (SDS):  No evidence of ischemia. Transient Ischemic Dilatation:  1.08  (Normal <1.22)  Lung/Heart Ratio:  .39  (Normal <0.45)  Quantitative Gated Spect Images QGS EDV:  128 ml QGS ESV:  74 ml QGS EF:  42 % QGS cine images:  Global hypokinesis; evidence of LVE.   Overall Impression  Exercise Capacity: Good exercise capacity. BP Response: Normal blood pressure response. Clinical Symptoms: No chest pain ECG Impression: No significant ST segment change suggestive of ischemia. Overall Impression: Abnormal stress nuclear study with apical thinning but no ischemia; LV function moderately reduced.  Appended Document: Cardiology Nuclear Study no ischemia  Appended Document: Cardiology Nuclear Study LMTCB   Appended Document: Cardiology Nuclear Study LMTCB   Appended Document: Cardiology Nuclear Study discussed results with pt by telephone

## 2010-05-10 NOTE — Progress Notes (Signed)
Summary: speak to nurse   Phone Note Call from Patient Call back at Home Phone [phone]   Reason for Call: Talk to Nurse Summary of Call: request to speak to nurse Initial call taken by: Migdalia Dk,  Aug 20, 2009 4:16 PM  Follow-up for Phone Call        [facility] Katina Dung, RN, BSN  Aug 20, 2009 5:57 PM  [facility] Katina Dung, RN, BSN  Aug 20, 2009 7:26 PM  [facility] Katina Dung, RN, BSN  Aug 24, 2009 12:09 PM  discussed recent Celine Ahr results with pt by telephone

## 2010-05-10 NOTE — Assessment & Plan Note (Signed)
Summary: 4 MONTH/DMP  Medications Added ASPIRIN EC 325 MG TBEC (ASPIRIN) Take one tablet by mouth daily LOVAZA 1 GM CAPS (OMEGA-3-ACID ETHYL ESTERS) two capsules  twice daily SPIRONOLACTONE 25 MG TABS (SPIRONOLACTONE) one-half tablet daily ZITHROMAX 250 MG TABS (AZITHROMYCIN) 500mg  the first day then 250mg  daily for the next four days        Primary Provider:  None  CC:  dry cough for 2 weeks.  History of Present Illness: 60 yo with history of nonischemic cardiomyopathy presents for followup.  Since last appointment, he had a left knee arthroscopy complicated by hematoma in the lower leg.  His echo done on 9/10 showed EF 40% with mild global hypokinesis.  He had been doing well with minimal symptoms until about a week ago when he developed a severe cough that has been present since that time.  He coughs up some yellowish sputum.  No fever.  He has been short of breath walking up a flight of steps for the last week (new).  Usually he has no dyspnea with exertion.    ECG: NSR, normal  CXR: Some peribronhiolar thickening, no edema or PNA.   Labs (12/09): SPEP negative, HIV negative, transferrin saturation 29%, BNP 24 Labs (9/10): direct LDL 54, HDL 13.8, triglycerides 1470, creatinine 0.8 Labs (05/11/09): BNP 27, WBCs 5.6  Current Medications (verified): 1)  Aspirin Ec 325 Mg Tbec (Aspirin) .... Take One Tablet By Mouth Daily 2)  Lisinopril 10 Mg Tabs (Lisinopril) .Marland Kitchen.. 1 1/2  Twice A Day 3)  Carvedilol 12.5 Mg Tabs (Carvedilol) .... Take One Tablet By Mouth Twice A Day 4)  Vicodin 5-500 Mg Tabs (Hydrocodone-Acetaminophen) .... Up To 4 Tabs A Day As Needed For Pain 5)  Lovaza 1 Gm Caps (Omega-3-Acid Ethyl Esters) .... Two Capsules  Twice Daily 6)  Spironolactone 25 Mg Tabs (Spironolactone) .... One-Half Tablet Daily  Allergies: No Known Drug Allergies  Past History:  Past Medical History: 1. Nonischemic cardiomyopathy.  The patient had a left heart catheterization done in March 2004  with normal coronaries and then in November 2008, he had a Myoview done that showed an EF of 39% with no ischemia.  Echo in April 2008 showed an EF of 35-40%, moderate diffuse hypokinesis, mild left atrial enlargement, normal RV size and function and essentially normal valves.  The cause of his cardiomyopathy has not been discovered. He has never been a heavy drinker.  He has never used cocaine and amphetamines.  HIV, SPEP, and ferritin/Fe studies were all negative in 12/09.  Echo (9/10): EF 40%, mild to moderate global HK, mild LAE.  2. Hypertriglyceridemia 3. Rotator cuff tendonitis with a history of bilateral shoulder surgery. 4. Hypertension. 5. History of a back surgery. 6. Knee osteoarthritis, s/p arthroscopy 2010 7. Gastroesophageal reflux disease.  8. Allergic rhinitis  Family History: Reviewed history from 04/23/2009 and no changes required. No premature CAD.   Social History: The patient denies smoking.  He does not use any drugs. He has never used any illicit drugs other than marijuana, which he smoked occasionally in the distant past.  He rarely drinks alcohol and was never heavy drinker. He is on disability. He is divorced with one son.   Review of Systems       All systems reviewed and negative except as per HPI.   Vital Signs:  Patient profile:   60 year old male Height:      67 inches Weight:      174.25 pounds Pulse rate:  58 / minute BP sitting:   108 / 78  Vitals Entered By: Katina Dung, RN, BSN (May 11, 2009 2:42 PM)  Physical Exam  General:  Well developed, well nourished, in no acute distress. Coughing.  Neck:  Neck supple, no JVD. No masses, thyromegaly or abnormal cervical nodes. Lungs:  Rhonchi and coarse breath sounds bilaterally.  Heart:  Non-displaced PMI, chest non-tender; regular rate and rhythm, S1, S2 without murmurs, rubs or gallops. Carotid upstroke normal, no bruit. Pedals normal pulses. Trace ankle edema. Abdomen:  Bowel sounds positive;  abdomen soft and non-tender without masses, organomegaly, or hernias noted. No hepatosplenomegaly. Extremities:  No clubbing or cyanosis. Neurologic:  Alert and oriented x 3. Psych:  Normal affect.   Impression & Recommendations:  Problem # 1:  COUGH (ICD-786.2) I think that the patient has acute bronchitis.  He is not volume overloaded on exam and JVP is not elevated.  He is coughing a lot with coarse breath sounds and rhonchi.  He has no PNA on chest X-ray.  I will have him take Robitussin-DM and also a course of azithromycin as the symptoms have been present for at least a week.   Problem # 2:  HYPERTRIGLYCERIDEMIA (ICD-272.1) Patient needs to return for lipids/LFTs (fasting).  I will increase Lovaza to 4 grams daily today.   Problem # 3:  CARDIOMYOPATHY (ICD-425.4) Nonischemic cardiomyopathy, EF 40%.  Cause is not certain.  No heavy substance abuse, HIV negative, SPEP negative, no evidence for hemochromatosis.  He appears euvolemic on exam with normal BNP.  I will have him continue Coreg 12.5 two times a day and lisinopril 15 mg two times a day.  He will start spironolactone 12.5 mg daily today.  BMET in 2 wks.  He needs to cut back on sodium intake.   Other Orders: EKG w/ Interpretation (93000) TLB-BNP (B-Natriuretic Peptide) (83880-BNPR) TLB-CBC Platelet - w/Differential (85025-CBCD) T-2 View CXR (71020TC)  Patient Instructions: 1)  A chest x-ray takes a picture of the organs and structures inside the chest, including the heart, lungs, and blood vessels. This test can show several things, including, whether the heart is enlarged; whether fluid is building up in the lungs; and whether pacemaker / defibrillator leads are still in place. 2)  Your physician recommends that you have lab today---BNP/CBC 786.2 425.4 3)  Your physician has recommended you make the following change in your medication:  4)  Increase Lovaza to 2 Grams twice a day 5)  Start Spironolactone 12.5mg  daily--this  will be one-half of a 25 mg tablet 6)  Your physician recommends that you return for a FASTING lipid profile/liver profile/BMP in 10 days  786.2 425.4 7)  Use Robitussin DM as directed for your cough  8)  Your physician recommends that you schedule a follow-up appointment in: 3 months with Dr. Marca Ancona  Prescriptions: ZITHROMAX 250 MG TABS (AZITHROMYCIN) 500mg  the first day then 250mg  daily for the next four days  #6 x 0   Entered by:   Katina Dung, RN, BSN   Authorized by:   Marca Ancona, MD   Signed by:   Katina Dung, RN, BSN on 05/11/2009   Method used:   Electronically to        [facility] (retail)       [address]       [place], [place]  [address]       Ph: [phone]  Fax: [phone]   RxID:   [id number] SPIRONOLACTONE 25 MG TABS (SPIRONOLACTONE) one-half tablet daily  #30 x 3   Entered by:   Katina Dung, RN, BSN   Authorized by:   Marca Ancona, MD   Signed by:   Katina Dung, RN, BSN on 05/11/2009   Method used:   Electronically to        [facility] (retail)       [address]       [place], [place]  [address]       Ph: [phone]       Fax: [phone]   RxID:   [id number] LOVAZA 1 GM CAPS (OMEGA-3-ACID ETHYL ESTERS) two capsules  twice daily  #120 x 6   Entered by:   Katina Dung, RN, BSN   Authorized by:   Marca Ancona, MD   Signed by:   Katina Dung, RN, BSN on 05/11/2009   Method used:   Electronically to        [facility] (retail)       [address]       [place], [place]  [address]       Ph: [phone]       Fax: [phone]   RxID:   [id number]    Vital Signs:  Patient profile:   60 year old male Height:      67 inches Weight:      174.25 pounds Pulse rate:   58 / minute BP sitting:   108 / 78  Vitals Entered By: Katina Dung, RN, BSN (May 11, 2009 2:42 PM)

## 2010-05-10 NOTE — Cardiovascular Report (Signed)
Summary: Outpatient Coinsurance Notice   Outpatient Coinsurance Notice   Imported By: Roderic Ovens 08/23/2009 12:27:48  _____________________________________________________________________  External Attachment:    Type:   Image     Comment:   External Document

## 2010-05-10 NOTE — Progress Notes (Signed)
Summary: pt has questions re med  Phone Note Call from Patient   Caller: Patient [phone] Reason for Call: Talk to Nurse Summary of Call: pt calling re med he is taking Initial call taken by: Glynda Jaeger,  February 22, 2010 2:10 PM  Follow-up for Phone Call        [facility] Katina Dung, RN, BSN  February 22, 2010 2:26 PM --pt requesting a prescription for Cialis or Viagra--I will review with Dr Shirlee Latch     Appended Document: pt has questions re med He does not take NTG.  Should be ok for Viagra 25-50 mg prn  Appended Document: pt has questions re med LMTCB  Appended Document: pt has questions re med [facility]

## 2010-05-10 NOTE — Assessment & Plan Note (Signed)
Summary: 3 [email or web address]  Medications Added CARVEDILOL 12.5 MG TABS (CARVEDILOL) Take one tablet by mouth twice a day SPIRONOLACTONE 25 MG TABS (SPIRONOLACTONE) one  daily      Allergies Added: NKDA  Primary Provider:  None  CC:  3 month follow up.  pt states he has had chest pains in his chest on the left side radiating into his back..  History of Present Illness: 60 yo with history of nonischemic cardiomyopathy presents for followup.  His main complaint this visit is atypical chest pain.  He had a severe episode last week that spread across his chest and radiated to the back.  This lasted for 10 minutes.  He also has been having severe, sharp pains that seem momentary and occur 2-3 times a week.  All of these pains are nonexertional.  He is very worried that he could have a heart attack.  Patient also occasionally has lightheadedness with standing but this is rare.  Sometimes he is short of breath with steps but usually not.    ECG: NSR, normal  Labs (12/09): SPEP negative, HIV negative, transferrin saturation 29%, BNP 24 Labs (9/10): direct LDL 54, HDL 13.8, triglycerides 1470, creatinine 0.8 Labs (05/11/09): BNP 27, WBCs 5.6  Current Medications (verified): 1)  Aspirin Ec 325 Mg Tbec (Aspirin) .... Take One Tablet By Mouth Daily 2)  Lisinopril 10 Mg Tabs (Lisinopril) .Marland Kitchen.. 1 1/2  Twice A Day 3)  Carvedilol 12.5 Mg Tabs (Carvedilol) .... Take One Tablet By Mouth Twice A Day 4)  Lovaza 1 Gm Caps (Omega-3-Acid Ethyl Esters) .... Two Capsules  Twice Daily 5)  Spironolactone 25 Mg Tabs (Spironolactone) .... One-Half Tablet Daily  Allergies (verified): No Known Drug Allergies  Past History:  Past Medical History: 1. Nonischemic cardiomyopathy.  The patient had a left heart catheterization done in March 2004 with normal coronaries and then in November 2008, he had a Myoview done that showed an EF of 39% with no ischemia.  Echo in April 2008 showed an EF of 35-40%, moderate diffuse  hypokinesis, mild left atrial enlargement, normal RV size and function and essentially normal valves.  The cause of his cardiomyopathy has not been discovered. He has never been a heavy drinker.  He has never used cocaine and amphetamines.  HIV, SPEP, and ferritin/Fe studies were all negative in 12/09.  Echo (9/10): EF 40%, mild to moderate global HK, mild LAE.  2. Hypertriglyceridemia 3. Rotator cuff tendonitis with a history of bilateral shoulder surgery. 4. Hypertension. 5. History of a back surgery. 6. Knee osteoarthritis, s/p arthroscopy 2010 7. Gastroesophageal reflux disease.  8. Allergic rhinitis  Family History: Reviewed history from 04/23/2009 and no changes required. No premature CAD.   Social History: Reviewed history from 05/11/2009 and no changes required. The patient denies smoking.  He does not use any drugs. He has never used any illicit drugs other than marijuana, which he smoked occasionally in the distant past.  He rarely drinks alcohol and was never heavy drinker. He is on disability. He is divorced with one son.   Review of Systems       All systems reviewed and negative except as per HPI.   Vital Signs:  Patient profile:   60 year old male Height:      67 inches Weight:      180 pounds BMI:     28.29 Pulse rate:   58 / minute Pulse rhythm:   regular BP sitting:   108 / 72  (left arm)  Cuff size:   regular  Vitals Entered By: Judithe Modest CMA (Aug 09, 2009 11:17 AM)  Physical Exam  General:  Well developed, well nourished, in no acute distress. Neck:  Neck supple, no JVD. No masses, thyromegaly or abnormal cervical nodes. Lungs:  Clear bilaterally to auscultation and percussion. Heart:  Non-displaced PMI, chest non-tender; regular rate and rhythm, S1, S2 without murmurs, rubs or gallops. Carotid upstroke normal, no bruit. Pedals normal pulses. No edema.  Abdomen:  Bowel sounds positive; abdomen soft and non-tender without masses, organomegaly, or hernias  noted. No hepatosplenomegaly. Extremities:  No clubbing or cyanosis. Neurologic:  Alert and oriented x 3. Psych:  Normal affect.   Impression & Recommendations:  Problem # 1:  CHEST PAIN-UNSPECIFIED (ICD-786.50) Atypical chest pain.  This pain is severe and substernal but not exertional.  He had a cath in 2004 with no significant disease.  Given his severe symptoms, will have him do an ETT-myoview for risk stratification.   Problem # 2:  CARDIOMYOPATHY (ICD-425.4) Nonischemic cardiomyopathy, EF 40%.  Cause is not certain.  No heavy substance abuse, HIV negative, SPEP negative, no evidence for hemochromatosis.  He appears euvolemic on exam.  I will have him continue Coreg 12.5 two times a day and lisinopril 15 mg two times a day, especially given occasional orthostatic symptoms.  He will increase spironolactone to 25 mg daily.  BMET in 2 wks.   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    I will set the patient up with a PCP.   Other Orders: EKG w/ Interpretation (93000) Primary Care Referral (Primary) TLB-Hepatic/Liver Function Pnl (80076-HEPATIC) Nuclear Stress Test (Nuc Stress Test) TLB-BMP (Basic Metabolic Panel-BMET) (80048-METABOL) TLB-Lipid Panel (80061-LIPID)  Patient Instructions: 1)  Your physician has recommended you make the following change in your medication:  2)  Increase Spironolactone to 25mg  daily 3)  Your physician recommends that you have  lab work today---L/L/BMP 786.50 428.22 4)  You have been referred to a primary care doctor at [facility] 5)  Your physician has requested that you have an exercise stress myoview.  For further information please visit [email or web address]  Please follow instruction sheet, as given. 6)  Your physician recommends that you schedule a follow-up appointment in: 4 months with Dr Shirlee Latch. Prescriptions: SPIRONOLACTONE 25 MG TABS (SPIRONOLACTONE) one  daily  #30 x 6    Entered by:   Katina Dung, RN, BSN   Authorized by:   Marca Ancona, MD   Signed by:   Katina Dung, RN, BSN on 08/09/2009   Method used:   Electronically to        [facility] (retail)       [address]       [place], [place]  [address]       Ph: [phone]       Fax: [phone]   RxID:   [id number]

## 2010-05-10 NOTE — Assessment & Plan Note (Signed)
History of Present Illness Visit Type: consult  Primary GI MD: Rob Bunting MD Primary Provider: na Requesting Provider: Laurey Morale, MD Chief Complaint: rectal bleeding  History of Present Illness:     very pleasant 60 year old man who has had indigestion, worst at night. Burning discomfort.  Also has intermttent minor rectal bleeding, can last for a couple days. No anal dicomfort.  No diarrhea or constipation.    The GERD like symptoms have been going on for years.  He took what sounds like nexium, this helped. Zantac OTC also helps.  NO dysphagia.  He has gained weight since off work.  has non-ischemic CM (most recent EF about 40%.           Current Medications (verified): 1)  Aspirin Ec 325 Mg Tbec (Aspirin) .... Take One Tablet By Mouth Daily 2)  Lisinopril 20 Mg Tabs (Lisinopril) .... One Twice A Day 3)  Carvedilol 12.5 Mg Tabs (Carvedilol) .... Take One Tablet By Mouth Twice A Day 4)  Lovaza 1 Gm Caps (Omega-3-Acid Ethyl Esters) .... Two Capsules  Twice Daily 5)  Multivitamins   Tabs (Multiple Vitamin) .Marland Kitchen.. 1 Tab Once Daily 6)  Ambien 5 Mg Tabs (Zolpidem Tartrate) .... One Hs As Needed For Sleep 7)  Eplerenone 25 Mg Tabs (Eplerenone) .... One Daily 8)  Acid Reducer 75 Mg Tabs (Ranitidine Hcl) .... One Tablet By Mouth Once Daily Will Take Up To Two Daily As Needed 9)  Multivitamins   Tabs (Multiple Vitamin) .... One Tablet By Mouth Once Daily  Allergies (verified): No Known Drug Allergies  Past History:  Past Medical History: 1. Nonischemic cardiomyopathy.  The patient had a left heart catheterization done in March 2004 with normal coronaries and then in November 2008, he had a Myoview done that showed an EF of 39% with no ischemia.  Echo in April 2008 showed an EF of 35-40%, moderate diffuse hypokinesis, mild left atrial enlargement, normal RV size and function and essentially normal valves.  The cause of his cardiomyopathy has not been discovered. He has never  been a heavy drinker.  He has never used cocaine and amphetamines.  HIV, SPEP, and ferritin/Fe studies were all negative in 12/09.  Echo (9/10): EF 40%, mild to moderate global HK, mild LAE.  Myoview (5/11): EF 42% with apical thinning but no evidence for ischemia or infarction.  2. Hypertriglyceridemia 3. Rotator cuff tendonitis with a history of bilateral shoulder surgery. 4. Hypertension. 5. History of a back surgery. 6. Knee osteoarthritis, s/p arthroscopy 2010 7. Gastroesophageal reflux disease.   8. Allergic rhinitis 9. Gynecomastia with spironolactone  Past Surgical History: Percutaneous transluminal coronary angioplasty Stress Cardiolite (12/15/2005) Transthoracic Echocardiogram ((07/13/2006) EKG (09/20/2006)     Family History: No premature CAD.  Father with prostate cancer Mother with CVA Brother with nonischemic cardiomyopathy, has ICD Grandmother with cancer (unsure what)    Social History: The patient denies smoking.  He does not use any drugs. He has never used any illicit drugs other than marijuana, which he smoked occasionally in the distant past.  He rarely drinks alcohol and was never heavy drinker. He is on disability. He is divorced with one son.     Review of Systems       Pertinent positive and negative review of systems were noted in the above HPI and GI specific review of systems.  All other review of systems was otherwise negative.   Vital Signs:  Patient profile:   60 year old male Height:  67.5 inches Weight:      186 pounds BMI:     28.81 BSA:     1.97 Pulse rate:   58 / minute Pulse rhythm:   regular BP sitting:   120 / 80  (left arm) Cuff size:   regular  Vitals Entered By: Ok Anis CMA (January 14, 2010 3:30 PM)  Physical Exam  Additional Exam:  Constitutional: generally well appearing Psychiatric: alert and oriented times 3 Eyes: extraocular movements intact Mouth: oropharynx moist, no lesions Neck: supple, no  lymphadenopathy Cardiovascular: heart regular rate and rythm Lungs: CTA bilaterally Abdomen: soft, non-tender, non-distended, no obvious ascites, no peritoneal signs, normal bowel sounds Extremities: no lower extremity edema bilaterally Skin: no lesions on visible extremities    Impression & Recommendations:  Problem # 1:  chronic GERD H2 blockers significantly helped with symptoms. He has had GERD-like symptoms for many years and we should proceed with EGD to screen for Barrett's, GERD complications.    Problem # 2:  intermittent rectal bleeding rectal exam was deferred for upcoming colonoscopy. I suspect he has some minor anal rectal disease such as internal hemorrhoids. He has never had a colonoscopy and so he needs colonoscopy for colon cancer screening as well as to evaluate his recent intermittent minor rectal bleeding. He will get a CBC to check to see if he is anemic however he does not appear to be so clinically.  Patient Instructions: 1)  You will be scheduled to have a colonoscopy. 2)  You will be scheduled to have an upper endoscopy. 3)  You will get lab test(s) done today (cbc). 4)  The medication list was reviewed and reconciled.  All changed / newly prescribed medications were explained.  A complete medication list was provided to the patient / caregiver.  Appended Document: Orders Update/Movi    Clinical Lists Changes  Problems: Added new problem of ANAL AND RECTAL POLYP (ICD-569.0) Added new problem of HEARTBURN (ICD-787.1) Medications: Added new medication of MOVIPREP 100 GM  SOLR (PEG-KCL-NACL-NASULF-NA ASC-C) As per prep instructions. - Signed Rx of MOVIPREP 100 GM  SOLR (PEG-KCL-NACL-NASULF-NA ASC-C) As per prep instructions.;  #1 x 0;  Signed;  Entered by: Chales Abrahams CMA (AAMA);  Authorized by: Rachael Fee MD;  Method used: Electronically to [facility] [address] [place], [place], [place]  [address], Ph: [phone], Fax:  [phone] Orders: Added new Test order of Colon/Endo (Colon/Endo) - Signed    Prescriptions: MOVIPREP 100 GM  SOLR (PEG-KCL-NACL-NASULF-NA ASC-C) As per prep instructions.  #1 x 0   Entered by:   Chales Abrahams CMA (AAMA)   Authorized by:   Rachael Fee MD   Signed by:   Chales Abrahams CMA (AAMA) on 01/14/2010   Method used:   Electronically to        [facility] (retail)       [address]       [place], [place]  [address]       Ph: [phone]       Fax: [phone]   RxID:   [id number]    Appended Document: Orders Update/Labs    Clinical Lists Changes  Orders: Added new Test order of TLB-CBC Platelet - w/Differential (85025-[id number]) - Signed

## 2010-05-10 NOTE — Progress Notes (Signed)
Summary: pt assistance form for Inspra   Phone Note Call from Patient   Caller: Patient Call For: nurse Summary of Call: unable to get eplerenone  Follow-up for Phone Call        pt states he is unable to get eplerenone(Inspra) from pharmacy--there is no generic and it will cost $90--I have completed physician section of  pt assistance form for the medication Inspra 25mg  daily -I have mailed the completed physician  section of the form to the patient--he will complete the patient section then mail the entire completed form  into [employer] [facility] [phone]

## 2010-05-10 NOTE — Letter (Signed)
Summary: New Patient letter  [facility]  [address] [place], [place] [address]   Phone: [phone]  Fax: [phone]       11/24/2009 MRN: [id number]  [name] [name] [address] [place], [place]  [address]  Dear Mr. Mcgaughy,  Welcome to the Gastroenterology Division at [facility].    You are scheduled to see Dr.  Christella Hartigan on 01-14-10 at 3pm on the 3rd floor at [facility], [address]  We ask that you try to arrive at our office 15 minutes prior to your appointment time to allow for check-in.  We would like you to complete the enclosed self-administered evaluation form prior to your visit and bring it with you on the day of your appointment.  We will review it with you.  Also, please bring a complete list of all your medications or, if you prefer, bring the medication bottles and we will list them.  Please bring your insurance card so that we may make a copy of it.  If your insurance requires a referral to see a specialist, please bring your referral form from your primary care physician.  Co-payments are due at the time of your visit and may be paid by cash, check or credit card.     Your office visit will consist of a consult with your physician (includes a physical exam), any laboratory testing he/she may order, scheduling of any necessary diagnostic testing (e.g. x-ray, ultrasound, CT-scan), and scheduling of a procedure (e.g. Endoscopy, Colonoscopy) if required.  Please allow enough time on your schedule to allow for any/all of these possibilities.    If you cannot keep your appointment, please call [phone] to cancel or reschedule prior to your appointment date.  This allows [place] the opportunity to schedule an appointment for another patient in need of care.  If you do not cancel or reschedule by 5 p.m. the business day prior to your appointment date, you will be charged a $50.00 late cancellation/no-show fee.    Thank you for  choosing [facility] for your medical needs.  We appreciate the opportunity to care for you.  Please visit [place] at our website  to learn more about our practice.                     Sincerely,                                                             The Gastroenterology Division

## 2010-05-10 NOTE — Letter (Signed)
Summary: [facility] - Mammography  [facility] - Mammography   Imported By: Marylou Mccoy 12/22/2009 16:15:36  _____________________________________________________________________  External Attachment:    Type:   Image     Comment:   External Document

## 2010-05-10 NOTE — Progress Notes (Signed)
Summary: [name] [name]   Phone Note Call from Patient Call back at Home Phone [phone]   Caller: Patient Reason for Call: Talk to Nurse Summary of Call: REQ CALL BACK, NEEDS NEW PRESCRIPTION Initial call taken by: Migdalia Dk,  May 12, 2009 1:49 PM  Follow-up for Phone Call        pt requesting prescription for Lovaza for 90 days-- I will mail RX to [place]  to [employer] for patient assistance program at pt's request

## 2010-05-10 NOTE — Letter (Signed)
Summary: [facility] Instructions  [facility]  [address] [place], [place] [address]   Phone: [phone]  Fax: [phone]       [name] [name]    [birth year]    MRN: [id number]        Procedure Day /Date:02/07/10 MON     Arrival Time:2 pm     Procedure Time:3 pm     Location of Procedure:                    X  [facility] (4th Floor)   PREPARATION FOR COLONOSCOPY WITH MOVIPREP   Starting 5 days prior to your procedure 02/02/10 do not eat nuts, seeds, popcorn, corn, beans, peas,  salads, or any raw vegetables.  Do not take any fiber supplements (e.g. Metamucil, Citrucel, and Benefiber).  THE DAY BEFORE YOUR PROCEDURE         DATE: 02/06/10  DAY: SUN  1.  Drink clear liquids the entire day-NO SOLID FOOD  2.  Do not drink anything colored red or purple.  Avoid juices with pulp.  No orange juice.  3.  Drink at least 64 oz. (8 glasses) of fluid/clear liquids during the day to prevent dehydration and help the prep work efficiently.  CLEAR LIQUIDS INCLUDE: Water Jello Ice Popsicles Tea (sugar ok, no milk/cream) Powdered fruit flavored drinks Coffee (sugar ok, no milk/cream) Gatorade Juice: apple, white grape, white cranberry  Lemonade Clear bullion, consomm, broth Carbonated beverages (any kind) Strained chicken noodle soup Hard Candy                             4.  In the morning, mix first dose of MoviPrep solution:    Empty 1 Pouch A and 1 Pouch B into the disposable container    Add lukewarm drinking water to the top line of the container. Mix to dissolve    Refrigerate (mixed solution should be used within 24 hrs)  5.  Begin drinking the prep at 5:00 p.m. The MoviPrep container is divided by 4 marks.   Every 15 minutes drink the solution down to the next mark (approximately 8 oz) until the full liter is complete.   6.  Follow completed prep with 16 oz of clear liquid of your choice (Nothing red or purple).  Continue to drink  clear liquids until bedtime.  7.  Before going to bed, mix second dose of MoviPrep solution:    Empty 1 Pouch A and 1 Pouch B into the disposable container    Add lukewarm drinking water to the top line of the container. Mix to dissolve    Refrigerate  THE DAY OF YOUR PROCEDURE      DATE: 02/07/10  DAY: MON  Beginning at 10 a.m. (5 hours before procedure):         1. Every 15 minutes, drink the solution down to the next mark (approx 8 oz) until the full liter is complete.  2. Follow completed prep with 16 oz. of clear liquid of your choice.    3. You may drink clear liquids until 1 pm (2 HOURS BEFORE PROCEDURE).   MEDICATION INSTRUCTIONS  Unless otherwise instructed, you should take regular prescription medications with a small sip of water   as early as possible the morning of your procedure.           OTHER INSTRUCTIONS  You will need a responsible adult at least 58  years of age to accompany you and drive you home.   This person must remain in the waiting room during your procedure.  Wear loose fitting clothing that is easily removed.  Leave jewelry and other valuables at home.  However, you may wish to bring a book to read or  an iPod/MP3 player to listen to music as you wait for your procedure to start.  Remove all body piercing jewelry and leave at home.  Total time from sign-in until discharge is approximately 2-3 hours.  You should go home directly after your procedure and rest.  You can resume normal activities the  day after your procedure.  The day of your procedure you should not:   Drive   Make legal decisions   Operate machinery   Drink alcohol   Return to work  You will receive specific instructions about eating, activities and medications before you leave.    The above instructions have been reviewed and explained to me by   _______________________    I fully understand and can verbalize these instructions  _____________________________ Date _________

## 2010-05-10 NOTE — Progress Notes (Signed)
Summary: returned call**rtn your call 2nd time**  Phone Note Call from Patient   Caller: Patient [phone] Reason for Call: Talk to Nurse Summary of Call: returned anne's call Initial call taken by: Glynda Jaeger,  February 28, 2010 12:10 PM  Follow-up for Phone Call        [facility] Katina Dung, RN, BSN  February 28, 2010 3:09 PM   Additional Follow-up for Phone Call Additional follow up Details #1::        PT RTN YOUR CALL Omer Jack  February 28, 2010 4:54 PM  LMTCB--nt 03/04/10--1500p--spoke to pzier connection to care which fills this pt's meds for free--pzier stated that the RX for viagra has beern ordered by dr Everardo All and refilled recently--he will not receive anymore untill january "12, and that will need to be called in by dr Gareth Eagle on identified voice mail as no answer at home number Additional Follow-up by: Ledon Snare, RN,  March 04, 2010 2:57 PM

## 2010-05-10 NOTE — Progress Notes (Signed)
Summary: Inspra samples  Phone Note Outgoing Call   Call placed by: Katina Dung, RN, BSN,  February 02, 2010 5:11 PM Call placed to: Patient Summary of Call: samples of Inspra  Follow-up for Phone Call        I left pt a message I have received Inspra 25mg  #30 x3 from [facility] left a desk for pt to pickup--pt needs a BMP 2 weeks after starting Inspra 25mg  daily--I left pt a message to call me so I can schedule BMP   Katina Dung, RN, BSN  February 02, 2010 5:16 PM

## 2010-05-10 NOTE — Progress Notes (Signed)
Summary: 11/05/09--left second message to call back--nt   Phone Note Call from Patient Call back at Home Phone [phone]   Caller: Patient Summary of Call: Chest pains Initial call taken by: Judie Grieve,  November 04, 2009 11:28 AM  Follow-up for Phone Call        Attempted to call patient on cell phone with no answer and no mailbox set up. Tried work phone with no success ( I was informed that he does not work there.)  Follow-up by: J REISS RN     Appended Document: chest pains/ NA can't leave a message/JR He had a recent negative myoview.  If he is continuing to have chest pain, needs to come to office and get CXR and evaluation, cath may be next step.  If pain is severe, should go to ER.   Appended Document: chest pains/ NA can't leave a message/JR LMOM with above information.

## 2010-05-10 NOTE — Letter (Signed)
Summary: [facility]   Imported By: Marylou Mccoy 02/01/2010 14:58:36  _____________________________________________________________________  External Attachment:    Type:   Image     Comment:   External Document

## 2010-05-12 ENCOUNTER — Ambulatory Visit (INDEPENDENT_AMBULATORY_CARE_PROVIDER_SITE_OTHER): Payer: Medicare Other | Admitting: Cardiology

## 2010-05-12 ENCOUNTER — Other Ambulatory Visit: Payer: Self-pay | Admitting: Cardiology

## 2010-05-12 ENCOUNTER — Encounter: Payer: Self-pay | Admitting: Cardiology

## 2010-05-12 DIAGNOSIS — E785 Hyperlipidemia, unspecified: Secondary | ICD-10-CM

## 2010-05-12 DIAGNOSIS — I5023 Acute on chronic systolic (congestive) heart failure: Secondary | ICD-10-CM

## 2010-05-12 DIAGNOSIS — I428 Other cardiomyopathies: Secondary | ICD-10-CM

## 2010-05-12 DIAGNOSIS — Z125 Encounter for screening for malignant neoplasm of prostate: Secondary | ICD-10-CM

## 2010-05-12 LAB — LIPID PANEL
Total CHOL/HDL Ratio: 6
Triglycerides: 206 mg/dL — ABNORMAL HIGH (ref 0.0–149.0)

## 2010-05-12 LAB — BASIC METABOLIC PANEL
BUN: 24 mg/dL — ABNORMAL HIGH (ref 6–23)
CO2: 25 mEq/L (ref 19–32)
Calcium: 8.9 mg/dL (ref 8.4–10.5)
Chloride: 101 mEq/L (ref 96–112)
Creatinine, Ser: 1.7 mg/dL — ABNORMAL HIGH (ref 0.4–1.5)
Glucose, Bld: 84 mg/dL (ref 70–99)

## 2010-05-12 LAB — PSA: PSA: 0.45 ng/mL (ref 0.10–4.00)

## 2010-05-12 LAB — LDL CHOLESTEROL, DIRECT: Direct LDL: 105.2 mg/dL

## 2010-05-12 NOTE — Progress Notes (Signed)
Summary: More samples of Nexium  Phone Note Call from Patient Call back at Home Phone [phone]   Call For: Dr Christella Hartigan Summary of Call: Wonders if he can have more samples of Nexium Initial call taken by: Leanor Kail [facility],  May 05, 2010 1:32 PM  Follow-up for Phone Call        samples of nexium left at front desk. pt is aware Follow-up by: Chales Abrahams CMA Duncan Dull),  May 05, 2010 1:55 PM

## 2010-05-12 NOTE — Progress Notes (Signed)
Summary: Inspra 25mg    Phone Note Outgoing Call   Call placed by: Katina Dung, RN, BSN,  May 05, 2010 11:17 AM Call placed to: Patient Summary of Call: Inspra  Follow-up for Phone Call        notified pt that Inspra 25mg  #90 was a front desk for him to pickup

## 2010-05-12 NOTE — Progress Notes (Signed)
Summary: Prep ?S  Phone Note Call from Patient Call back at [facility] Phone [phone]   Caller: Patient Call For: Dr. Christella Hartigan Reason for Call: Talk to Nurse Summary of Call: Now patient needs to know the directions for his prep... he just picked it up at pharmacy Initial call taken by: [place] Johnson,  April 22, 2010 11:25 AM  Follow-up for Phone Call        step by step instructions given for Miralax prep.  Pt unable to find Dulcolax or generic for/ tabs in prep.  plans to call pharmacy. Follow-up by: Oda Cogan RN,  April 22, 2010 11:38 AM

## 2010-05-12 NOTE — Progress Notes (Signed)
Summary: Procedure ?S  Phone Note Call from Patient Call back at Home Phone [phone]   Caller: Patient Call For: Dr. Christella Hartigan Reason for Call: Talk to Nurse Summary of Call: Pt is calling because he has an appt on monday and he says "his stomach has been tore up for the past day" and last night he was running a fever wants to know if he should keep his procedure for monday or reschedule his appt Initial call taken by: [place] Johnson,  April 22, 2010 9:11 AM  Follow-up for Phone Call        no more fever, stated, "that's stopped".  Rn encouraged pt to report any new or worsening symptoms but at this pt plan to be here Monday for procedure.  If primary MD consult is needed for meds or antibiotic related to diarrhea please seek appropriate counsel today or this weekend. Follow-up by: Doristine Church RN II,  April 22, 2010 9:18 AM

## 2010-05-12 NOTE — Progress Notes (Signed)
Summary: Questions about meds  Phone Note Call from Patient Call back at Home Phone [phone]   Caller: Patient Call For: Dr. Christella Hartigan Reason for Call: Talk to Nurse Summary of Call: Has some questions about his medication Initial call taken by: Karna Christmas,  May 04, 2010 3:46 PM  Follow-up for Phone Call        pt wanted to be sure he had all the rx he was sent in, we went over the meds he needs to be taking and that he should avoid ALL alcohol while taking the metronidazole.  He verbalized understanding and will call with any future problems Follow-up by: Chales Abrahams CMA Duncan Dull),  May 04, 2010 3:57 PM

## 2010-05-12 NOTE — Procedures (Addendum)
Summary: Upper Endoscopy  Patient: [name] Note: All result statuses are Final unless otherwise noted.  Tests: (1) Upper Endoscopy (EGD)   EGD Upper Endoscopy       DONE     [facility]     [address]     [place], [place]  [address]           ENDOSCOPY PROCEDURE REPORT           PATIENT:  [name] [name], [name] [name]  MR#:  [id number]     BIRTHDATE:  [birth year], 59 yrs. old  GENDER:  male     ENDOSCOPIST:  Rachael Fee, MD     PROCEDURE DATE:  04/25/2010     PROCEDURE:  EGD with biopsy, [address]     ASA CLASS:  Class II     INDICATIONS:  dyspepsia, GERD-like     MEDICATIONS:  There was residual sedation effect present from     prior procedure., Versed 6 mg IV     TOPICAL ANESTHETIC:  Exactacain Spray           DESCRIPTION OF PROCEDURE:   After the risks benefits and     alternatives of the procedure were thoroughly explained, informed     consent was obtained.  The LB [id number]-[id number] endoscope was     introduced through the mouth and advanced to the second portion of     the duodenum, without limitations.  The instrument was slowly     withdrawn as the mucosa was fully examined.     <<PROCEDUREIMAGES>>     There were four "spokes" of gastric erosions in the antrum.     Biopsies taken and sent to pathology (see image3).  Esophagitis     was found. There were three 1cm long, linear erosions at GE     junction consistent with erosive esophagitis (see image2 and     image9).  A hiatal hernia was found. This was 2cm (see image7).     Otherwise the examination was normal (see image4 and image5).     Retroflexed views revealed no abnormalities.    The scope was then     withdrawn from the patient and the procedure completed.     COMPLICATIONS:  None           ENDOSCOPIC IMPRESSION:     1) Mild gastritis, biopsied to check for H. pylori     2) Erosive reflux esophagitis     3) Hiatal hernia, small to medium sized     4) Otherwise normal examination        RECOMMENDATIONS:     H2 blocker (zantac, pepcid) is not effective enough.  Will call     in prescription of PPI (nexium) that he will start to take daily,     indefinitely.  If biopsies show H. pylori, he will be put on     appropriate antibiotics.           ______________________________     Rachael Fee, MD           cc: Marca Ancona, MD           n.     eSIGNED:   Rachael Fee at 04/25/2010 03:20 PM           [place] Dense, [id number]  Note: An exclamation mark (!) indicates a result that was not dispersed into the flowsheet. Document Creation Date: 04/25/2010 3:21 PM _______________________________________________________________________  (1) Order result status:  Final Collection or observation date-time: 04/25/2010 15:13 Requested date-time:  Receipt date-time:  Reported date-time:  Referring Physician:   Ordering Physician: Rob Bunting [phone]) Specimen Source:  Source: Launa Grill Order Number: [id number] Lab site:

## 2010-05-12 NOTE — Procedures (Signed)
Summary: Colonoscopy  Patient: [name] [name] Note: All result statuses are Final unless otherwise noted.  Tests: (1) Colonoscopy (COL)   COL Colonoscopy           DONE     [facility]     [address]     [place], [place]  [address]           COLONOSCOPY PROCEDURE REPORT           PATIENT:  [name] [name], [name] [name]  MR#:  [id number]     BIRTHDATE:  [birth year], 59 yrs. old  GENDER:  male     ENDOSCOPIST:  Rachael Fee, MD     REF. BY:  Laurey Morale, M.D.     PROCEDURE DATE:  04/25/2010     PROCEDURE:  Diagnostic Colonoscopy     ASA CLASS:  Class II     INDICATIONS:  mild intermittent rectal bleeding     MEDICATIONS:   Fentanyl 100 mcg IV, Versed 10 mg IV           DESCRIPTION OF PROCEDURE:   After the risks benefits and     alternatives of the procedure were thoroughly explained, informed     consent was obtained.  Digital rectal exam was performed and     revealed no abnormalities.   The LB [id number] endoscope     was introduced through the anus and advanced to the cecum, which     was identified by both the appendix and ileocecal valve, without     limitations.  The quality of the prep was adequate, using MiraLax.     The instrument was then slowly withdrawn as the colon was fully     examined.     <<PROCEDUREIMAGES>>     FINDINGS:  Mild diverticulosis was found in the sigmoid to     descending colon segments (see image1).  External hemorrhoids were     found. These were small and not thrombosed.  This was otherwise a     normal examination of the colon (see image2, image3, and image4).     Retroflexed views in the rectum revealed no abnormalities.    The     scope was then withdrawn from the patient and the procedure     completed.     COMPLICATIONS:  None           ENDOSCOPIC IMPRESSION:     1) Mild diverticulosis in the sigmoid to descending colon     segments     2) External hemorrhoids     3) Otherwise normal examination        RECOMMENDATIONS:     1) You should continue to follow colorectal cancer screening     guidelines for "routine risk" patients with a repeat colonoscopy     in 10 years. There is no need for FOBT (stool) testing for at     least 5 years.           REPEAT EXAM:  10 years           _____________________________     Rachael Fee, MD           n.     eSIGNED:   Rachael Fee at 04/25/2010 03:09 PM           [place]wood Dense, [id number]  Note: An exclamation mark (!) indicates a result that was not dispersed into the flowsheet. Document Creation Date: 04/25/2010 3:10 PM _______________________________________________________________________  (  1) Order result status: Final Collection or observation date-time: 04/25/2010 15:05 Requested date-time:  Receipt date-time:  Reported date-time:  Referring Physician:   Ordering Physician: Rob Bunting [phone]) Specimen Source:  Source: Launa Grill Order Number: [id number] Lab site:   Appended Document: Colonoscopy    Clinical Lists Changes  Observations: Added new observation of COLONNXTDUE: 04/2020 (04/25/2010 15:28)

## 2010-05-12 NOTE — Progress Notes (Signed)
Summary: Questions  Phone Note Call from Patient Call back at Home Phone [phone]   Caller: Patient Call For: Dr. Christella Hartigan Reason for Call: Talk to Nurse Summary of Call: Has questions about the procedures that he had yesterday Initial call taken by: [place] Johnson,  April 26, 2010 12:55 PM  Follow-up for Phone Call        attempted to contact pt. went straight to voicemail message left. Follow-up by: Greer Ee RN,  April 26, 2010 1:26 PM    Additional Follow-up for Phone Call Additional follow up Details #2::    recieved call from pt. pt. stated that he does not have insurance and is unable to afford the nexium rx. called down stairs and spoke with pam and asked if pt. can pick up samples. samples will be provided for pt. and pt. instructed to go to third floor to pick them up, he stated o.k.  DR. Christella Hartigan will be made aware of pt. problem so we can be advised. Follow-up by: Greer Ee RN,  April 26, 2010 2:10 PM   Appended Document: Questions please call him.  OTC prilosec ought to work fine.  he should start one pill twice daily for 2 months, then back down to one pill once daily  Appended Document: Questions called pt. and informed pt. that DR. Christella Hartigan instructed him to take otc prilosec two times a day fo 2months then once daily and that should be fine. Instructed the pt. to still come and get nexium from 3 rd floor stated that he was on his way to get it. pt. stated he wanted to see if he could get medication free because of limited income informed pt. that did inform DR.of lack of insurance.

## 2010-05-12 NOTE — Miscellaneous (Signed)
Summary: rx  Clinical Lists Changes  Medications: Removed medication of ACID REDUCER 75 MG TABS (RANITIDINE HCL) one tablet by mouth once daily will take up to two daily as needed Added new medication of NEXIUM 40 MG  CPDR (ESOMEPRAZOLE MAGNESIUM) 1 capsule each day 20- 30 minutes before breakfast meal - Signed Rx of NEXIUM 40 MG  CPDR (ESOMEPRAZOLE MAGNESIUM) 1 capsule each day 20- 30 minutes before breakfast meal;  #30 x 11;  Signed;  Entered by: Rachael Fee MD;  Authorized by: Rachael Fee MD;  Method used: Print then Give to Patient    Prescriptions: NEXIUM 40 MG  CPDR (ESOMEPRAZOLE MAGNESIUM) 1 capsule each day 20- 30 minutes before breakfast meal  #30 x 11   Entered and Authorized by:   Rachael Fee MD   Signed by:   Rachael Fee MD on 04/25/2010   Method used:   Print then Give to Patient   RxID:   [id number]

## 2010-05-12 NOTE — Miscellaneous (Signed)
Summary: [facility]   Imported By: Roderic Ovens 03/21/2010 15:12:48  _____________________________________________________________________  External Attachment:    Type:   Image     Comment:   External Document

## 2010-05-12 NOTE — Progress Notes (Signed)
Summary: reorder Inspra  Phone Note Outgoing Call   Call placed by: Katina Dung, RN, BSN,  April 25, 2010 4:56 PM Call placed to: Specialist Summary of Call: Inspra  Follow-up for Phone Call        I reordered Inspra 25mg  daily from [facility] [phone] --I talked with Claris Gower at [facility] should be here in 7-10 business days-I left message for pt on answering maching that I had reordered medication

## 2010-05-12 NOTE — Progress Notes (Signed)
Summary: medication  Phone Note Call from [name] [name] Call back at Home Phone [phone]   Caller: [name] [name] Call For: Dr. Christella Hartigan Reason for Call: Talk to Nurse Summary of Call: [name] [name] still can not afford medication... Initial call taken by: [place] Johnson,  May 03, 2010 3:54 PM  Follow-up for Phone Call        Clarithromycin was $160 and cant afford.  The Amox is $8 dollars.  What else can he replace the Clarithromycin with? Chales Abrahams CMA Duncan Dull)  May 03, 2010 4:02 PM   Additional Follow-up for Phone Call Additional follow up Details #1::        scrap the amox, clarithroymicin and instead:  Bismuth subsalicylate 525 mg four times daily, metronidazole 250 mg four times daily, tetracycline 500 mg four times daily, PPI twice daily for 14 days.     Additional Follow-up by: Rachael Fee MD,  May 04, 2010 9:55 AM    Additional Follow-up for Phone Call Additional follow up Details #2::    pt aware of meds sent and to take his nexium two times a day x 14 days then once daily Follow-up by: Chales Abrahams CMA (AAMA),  May 04, 2010 10:08 AM  New/Updated Medications: STOMACH RELIEF PLUS 525 MG/15ML SUSP (BISMUTH SUBSALICYLATE) 525 mg four times a day x 14 days METRONIDAZOLE 250 MG TABS (METRONIDAZOLE) 1 by mouth four times a day  x 14 days TETRACYCLINE HCL 500 MG CAPS (TETRACYCLINE HCL) 1 by mouth four times a day Prescriptions: TETRACYCLINE HCL 500 MG CAPS (TETRACYCLINE HCL) 1 by mouth four times a day  #56 x 0   Entered by:   Chales Abrahams CMA (AAMA)   Authorized by:   Rachael Fee MD   Signed by:   Chales Abrahams CMA (AAMA) on 05/04/2010   Method used:   Electronically to        [facility] (retail)       [address]       [place], [place]  [address]       Ph: [phone]       Fax: [phone]   RxID:   [id number] METRONIDAZOLE 250 MG TABS (METRONIDAZOLE) 1 by mouth four times a day  x 14 days  #56 x 0   Entered  by:   Chales Abrahams CMA (AAMA)   Authorized by:   Rachael Fee MD   Signed by:   Chales Abrahams CMA (AAMA) on 05/04/2010   Method used:   Electronically to        [facility].* (retail)       [address]       [place], [place]  [address]       Ph: [phone]       Fax: [phone]   RxID:   [id number] STOMACH RELIEF PLUS 525 MG/15ML SUSP (BISMUTH SUBSALICYLATE) 525 mg four times a day x 14 days  #14 days x 0   Entered by:   Chales Abrahams CMA (AAMA)   Authorized by:   Rachael Fee MD   Signed by:   Chales Abrahams CMA (AAMA) on 05/04/2010   Method used:   Electronically to        [facility] (retail)       [address]       [place]  [place], [place]  [address]       Ph: [phone]       Fax: [phone]   RxID:   [id number]

## 2010-05-12 NOTE — Progress Notes (Signed)
Summary: Medication  Phone Note Call from Patient Call back at Home Phone [phone]   Caller: Patient Call For: Dr. Christella Hartigan Reason for Call: Talk to Nurse Summary of Call: Pt went to get medication and it 490 dollars and he can not afford it , wants to know if there is another option Initial call taken by: [place] Johnson,  May 03, 2010 2:34 PM  Follow-up for Phone Call        Dr Christella Hartigan can we break the prev pac down and send it? Follow-up by: Chales Abrahams CMA Duncan Dull),  May 03, 2010 2:38 PM  Additional Follow-up for Phone Call Additional follow up Details #1::        yes,  1gram of amox, twice daily 500mg  clarithromycin, twice daily PPI once daily  all for 2 weeks, no refills Additional Follow-up by: Rachael Fee MD,  May 03, 2010 2:54 PM    Additional Follow-up for Phone Call Additional follow up Details #2::    pt notified of the change in rx and advised to take his nexium everyday along with the new meds. Follow-up by: Chales Abrahams CMA (AAMA),  May 03, 2010 3:00 PM  New/Updated Medications: AMOXICILLIN 500 MG CAPS (AMOXICILLIN) 2 by mouth two times a day CLARITHROMYCIN 500 MG TABS (CLARITHROMYCIN) 1 by mouth two times a day Prescriptions: CLARITHROMYCIN 500 MG TABS (CLARITHROMYCIN) 1 by mouth two times a day  #28 x 0   Entered by:   Chales Abrahams CMA (AAMA)   Authorized by:   Rachael Fee MD   Signed by:   Chales Abrahams CMA (AAMA) on 05/03/2010   Method used:   Electronically to        [facility] (retail)       [address]       [place], [place]  [address]       Ph: [phone]       Fax: [phone]   RxID:   [id number] AMOXICILLIN 500 MG CAPS (AMOXICILLIN) 2 by mouth two times a day  #56 x 0   Entered by:   Chales Abrahams CMA (AAMA)   Authorized by:   Rachael Fee MD   Signed by:   Chales Abrahams CMA (AAMA) on 05/03/2010   Method used:   Electronically to        [facility].* (retail)      [address]       [place], [place]  [address]       Ph: [phone]       Fax: [phone]   RxID:   [id number]

## 2010-05-18 NOTE — Assessment & Plan Note (Signed)
Summary: 4 MONTH/D.MILLER/RS PER PT-MJ UNABLE TO CONFIRM APPT LMOM-MJ/...   Vital Signs:  Patient profile:   60 year old male Height:      67.5 inches Weight:      185 pounds BMI:     28.65 Pulse rate:   68 / minute Pulse rhythm:   regular BP sitting:   102 / 68  (left arm) Cuff size:   regular  Vitals Entered By: Judithe Modest CMA (May 12, 2010 9:00 AM)  Referring Provider:  Laurey Morale, MD Primary Provider:  na  CC:   .  History of Present Illness: 60 yo with history of nonischemic cardiomyopathy presents for followup.  He is doing well.  Gynecomastia resolved after stopping spironolactone.  He has been going to the gym and walks for 1-2 miles most days on the treadmill and does free weights with no exertional dyspnea.  No orthopnea.  He had an EGD recently showing erosive esophagitis and H. pylori-related gastritis.  He started Nexium as well as antibiotics and has had no further chest pain.  His weight has been going down.   Labs (12/09): SPEP negative, HIV negative, transferrin saturation 29%, BNP 24 Labs (9/10): direct LDL 54, HDL 13.8, triglycerides 1470, creatinine 0.8 Labs (05/11/09): BNP 27, WBCs 5.6 Labs (5/11): K 4.6, creatinine 1.2, TGs 238, LDL 108, HDL 34 Labs (8/11): K 4.3, creatinine 1.4, BNP 12.6  Current Medications (verified): 1)  Aspirin Ec 325 Mg Tbec (Aspirin) .... Take One Tablet By Mouth Daily 2)  Lisinopril 20 Mg Tabs (Lisinopril) .... One Twice A Day 3)  Carvedilol 12.5 Mg Tabs (Carvedilol) .... Take One Tablet By Mouth Twice A Day 4)  Lovaza 1 Gm Caps (Omega-3-Acid Ethyl Esters) .... Two Capsules  Twice Daily 5)  Multivitamins   Tabs (Multiple Vitamin) .Marland Kitchen.. 1 Tab Once Daily 6)  Eplerenone 25 Mg Tabs (Eplerenone) .... One Daily 7)  Nexium 40 Mg  Cpdr (Esomeprazole Magnesium) .Marland Kitchen.. 1 Capsule Each Day 20- 30 Minutes Before Breakfast Meal 8)  Stomach Relief Plus 525 Mg/43ml Susp (Bismuth Subsalicylate) .... 525 Mg Four Times A Day X 14 Days 9)   Metronidazole 250 Mg Tabs (Metronidazole) .Marland Kitchen.. 1 By Mouth Four Times A Day  X 14 Days 10)  Tetracycline Hcl 500 Mg Caps (Tetracycline Hcl) .Marland Kitchen.. 1 By Mouth Four Times A Day  Allergies (verified): No Known Drug Allergies  Past History:  Past Medical History: 1. Nonischemic cardiomyopathy.  The patient had a left heart catheterization done in March 2004 with normal coronaries and then in November 2008, he had a Myoview done that showed an EF of 39% with no ischemia.  Echo in April 2008 showed an EF of 35-40%, moderate diffuse hypokinesis, mild left atrial enlargement, normal RV size and function and essentially normal valves.  The cause of his cardiomyopathy has not been discovered. He has never been a heavy drinker.  He has never used cocaine and amphetamines.  HIV, SPEP, and ferritin/Fe studies were all negative in 12/09.  Echo (9/10): EF 40%, mild to moderate global HK, mild LAE.  Myoview (5/11): EF 42% with apical thinning but no evidence for ischemia or infarction.  2. Hypertriglyceridemia 3. Rotator cuff tendonitis with a history of bilateral shoulder surgery. 4. Hypertension. 5. History of a back surgery. 6. Knee osteoarthritis, s/p arthroscopy 2010 7. Gastroesophageal reflux disease.  EGD (1/12) with erosive esophagitis and gastritis, biopsy positive for H pylori (being treated). 8. Colonoscopy (1/12) with mild diverticulosis and hemorrhoids.  9. Allergic rhinitis 10. Gynecomastia with spironolactone (ok on eplerenone).   Family History: Reviewed history from 01/14/2010 and no changes required. No premature CAD.  Father with prostate cancer Mother with CVA Brother with nonischemic cardiomyopathy, has ICD Grandmother with cancer (unsure what)    Social History: Reviewed history from 03/24/2010 and no changes required. The patient denies smoking.  He does not use any drugs. He has never used any illicit drugs other than marijuana, which he smoked occasionally in the distant past.  He  rarely drinks alcohol and was never heavy drinker. He is on disability. He is divorced with one son.     Review of Systems       All systems reviewed and negative except as per HPI.   Physical Exam  General:  Well developed, well nourished, in no acute distress. Neck:  Neck supple, no JVD. No masses, thyromegaly or abnormal cervical nodes. Lungs:  Clear bilaterally to auscultation and percussion. Heart:  Non-displaced PMI, chest non-tender; regular rate and rhythm, S1, S2 without murmurs, rubs or gallops. Carotid upstroke normal, no bruit.  Pedals normal pulses. No edema.  Abdomen:  Bowel sounds positive; abdomen soft and non-tender without masses, organomegaly, or hernias noted. No hepatosplenomegaly. Extremities:  No clubbing or cyanosis. Neurologic:  Alert and oriented x 3. Psych:  Normal affect.   Impression & Recommendations:  Problem # 1:  CHEST PAIN-UNSPECIFIED (ICD-786.50) I suspect that this was related to severe GERD.  He is pain-free now on Nexium.  Recent myoview with no evidence for ischemia.  He can decrease his aspirin dose to 81 mg daily.   Problem # 2:  CARDIOMYOPATHY (ICD-425.4) Nonischemic cardiomyopathy, EF 40%.  Cause is not certain.  No heavy substance abuse, HIV negative, SPEP negative, no evidence for hemochromatosis.  This may be familial as his brother apparently also has a nonischemic cardiomyopathy and recently got an ICD.  He appears euvolemic on exam.  He seems to be NYHA class II symptomatically for the most part, though he occasionally gets short of breath with climbing steps.  He will continue Coreg, eplerenone, and lisinopril at current doses.  He will get a repeat echo in 3/12 (If EF < 35%, will need ICD).  BMET today as well to follow K and creatinine.   Problem # 3:  HYPERTRIGLYCERIDEMIA (ICD-272.1) Will check lipids today.   Other Orders: Echocardiogram (Echo) TLB-BMP (Basic Metabolic Panel-BMET) (80048-METABOL) TLB-Lipid Panel  (80061-LIPID)  Patient Instructions: 1)  Your physician has recommended you make the following change in your medication:  2)  Decrease Aspirin to 81mg  daily--this should be coated. 3)  Your physician recommends that you return for a FASTING lipid profile/BMP ZOXWR604.54 4)    5)  Your physician has requested that you have an echocardiogram.  Echocardiography is a painless test that uses sound waves to create images of your heart. It provides your doctor with information about the size and shape of your heart and how well your heart's chambers and valves are working.  This procedure takes approximately one hour. There are no restrictions for this procedure. MARCH 2012 6)  Your physician wants you to follow-up in: 6 months with Dr Shirlee Latch.((AUGUST 2012)  You will receive a reminder letter in the mail two months in advance. If you don't receive a letter, please call our office to schedule the follow-up appointment.    Orders Added: 1)  Echocardiogram [Echo] 2)  TLB-BMP (Basic Metabolic Panel-BMET) [80048-METABOL] 3)  TLB-Lipid Panel [80061-LIPID]  Appended Document: 4 MONTH/D.MILLER/RS PER PT-MJ UNABLE TO CONFIRM APPT LMOM-MJ/...    Clinical Lists Changes  Orders: Added new Test order of TLB-PSA (Prostate Specific Antigen) (84153-PSA) - Signed

## 2010-06-09 ENCOUNTER — Telehealth: Payer: Self-pay | Admitting: Gastroenterology

## 2010-06-16 NOTE — Progress Notes (Signed)
Summary: Samples  Phone Note Call from Patient Call back at Home Phone [phone]   Caller: Patient Call For: Dr. Christella Hartigan Reason for Call: Talk to Nurse Summary of Call: Asking for samples of Nexium  Initial call taken by: Karna Christmas,  June 09, 2010 3:14 PM  Follow-up for Phone Call        samples left at the front desk , pt did inform me that he had an application sent to a med assist program to get his nexium he is just waiting on approval Follow-up by: Chales Abrahams CMA Duncan Dull),  June 09, 2010 4:02 PM

## 2010-06-22 ENCOUNTER — Ambulatory Visit (HOSPITAL_COMMUNITY): Payer: Medicare Other | Attending: Cardiology

## 2010-06-22 DIAGNOSIS — R0609 Other forms of dyspnea: Secondary | ICD-10-CM | POA: Insufficient documentation

## 2010-06-22 DIAGNOSIS — I519 Heart disease, unspecified: Secondary | ICD-10-CM | POA: Insufficient documentation

## 2010-06-22 DIAGNOSIS — I428 Other cardiomyopathies: Secondary | ICD-10-CM | POA: Insufficient documentation

## 2010-06-22 DIAGNOSIS — I10 Essential (primary) hypertension: Secondary | ICD-10-CM | POA: Insufficient documentation

## 2010-06-22 DIAGNOSIS — R0989 Other specified symptoms and signs involving the circulatory and respiratory systems: Secondary | ICD-10-CM | POA: Insufficient documentation

## 2010-06-22 DIAGNOSIS — R079 Chest pain, unspecified: Secondary | ICD-10-CM | POA: Insufficient documentation

## 2010-07-11 ENCOUNTER — Telehealth: Payer: Self-pay | Admitting: Cardiology

## 2010-07-11 NOTE — Telephone Encounter (Signed)
Pt needs inspera 5mg . Pt states you call it in to a program for him and he picks them up in the office.

## 2010-07-18 ENCOUNTER — Telehealth: Payer: Self-pay | Admitting: Cardiology

## 2010-07-18 NOTE — Telephone Encounter (Signed)
I talked with pt--pt requesting refill of Inspra 25mg  daily. I reordered through [employer] [phone] [address]. Order # [id number]. Per [employer] it should be here in 7-10 business days. Pt is aware.

## 2010-07-27 ENCOUNTER — Telehealth: Payer: Self-pay | Admitting: *Deleted

## 2010-07-27 NOTE — Telephone Encounter (Signed)
Pt notified Inspra 25mg  #90 received from [employer] pt assistance program. Medication left a front desk for pt to pick-up.

## 2010-08-03 ENCOUNTER — Telehealth: Payer: Self-pay | Admitting: Gastroenterology

## 2010-08-04 NOTE — Telephone Encounter (Signed)
nexium samples given, pt to pick up samples at front desk

## 2010-08-23 NOTE — Assessment & Plan Note (Signed)
[facility]                            CARDIOLOGY OFFICE NOTE   NAME:[name], [name] CORDOBA                     MRN:          [id number]  DATE:11/19/2007                            DOB:          [birth year]    The patient receives primary care at the [facility] in [place].   HISTORY OF PRESENT ILLNESS:  This is a 60 year old with a history of  nonischemic cardiomyopathy who was previously followed by Dr. Nona Dell here at the [facility]  His care has been taken over by  myself.  He returns for a regular scheduled clinic visit.  The patient  states that over the last 6 months or so he feels that he has been  feeling more tired.  He says that if he walks up a flight of steps too  fast he get short of breath and also if he walks up the hill.  He does  not get short of breath walking on flat ground or walking around in his  house.  He continues to go to the gym to use free weights.  He does not  do aerobic activities in the gym because of his osteoarthritis in his  knee.  The patient continues to get sharp pains in his left chest, they  come and go, they are dagger like and last for seconds, quite atypical,  not related to exertion.  Of note, he did have a Myoview done in  November 2008 that showed no ischemia.  The patient denies orthopnea or  PND.   PAST MEDICAL HISTORY:  1. Nonischemic cardiomyopathy.  The patient had a left heart      catheterization done in March 2004 with normal coronaries and then      in November 2008 he had a Myoview done that showed an EF of 39%      with no ischemia.  Echo in April 2008 showed EF of 35-40%, moderate      diffuse hypokinesis, mild left atrial enlargement, normal RV size      and function, and essentially normal valves.  2. Hypercholesterolemia.  The patient has been on Lipitor in the past      but could not tolerate and has stopped, did not have recent lipids      available for him.  3. Rotator cuff  tendinitis.  4. Hypertension.  5. History of back surgery.  6. Knee osteoarthritis.  7. Gastroesophageal reflux disease.   MEDICATIONS:  1. Bisoprolol and hydrochlorothiazide 2.5/6.25 daily.  2. Aspirin 325 mg daily.  3. Lisinopril 5 mg daily.  4. Omeprazole 20 mg b.i.d.   SOCIAL HISTORY:  The patient denies smoking.  He does not use any drugs  and has never really used any illicit drugs.  He drinks alcohol rarely  and was never a heavy drinker.   EKG:  EKG today is normal sinus rhythm with a borderline first-degree AV  block.  His PR is about 190.   PHYSICAL EXAMINATION:  VITAL SIGNS:  His weight is 169, heart rate 62  and regular.  Blood pressure 132/86.  GENERAL:  No apparent distress.  He is a well-developed male.  NECK:  No JVD.  There is no carotid bruit.  LUNGS:  Clear to auscultation bilaterally.  HEART:  Regular S1 and S2.  No S3.  No S4.  No murmur.  ABDOMEN:  Soft and nontender.  No hepatosplenomegaly.  EXTREMITIES:  There is no pedal edema.  There is no clubbing or  cyanosis.  NEURO:  Alert and oriented.  Normal affect.  SKIN:  There are multiple tattoos all across his body.  MUSCULOSKELETAL:  There is mild chest wall tenderness to palpation on  the right.   ASSESSMENT AND PLAN:  This is a 60 year old with a history of  nonischemic cardiomyopathy and atypical chest pain who presents to  Cardiology Clinic for re-evaluation.  1. Congestive heart failure.  The patient's most recent ejection      fraction was 39% on the Myoview from November 2008.  There has been      no evidence of coronary artery disease on catheterization or      Myoview.  Etiology of his cardiomyopathy is unknown.  There is no      history of significant alcohol abuse or drug use.  Blood pressure      appears to be under control.  We will check an SPEP and a UPEP to      rule out any evidence of amyloid.  The patient is euvolemic on exam      today.  We will plan on increasing his lisinopril to  10 mg daily      and changing him from bisoprolol and hydrochlorothiazide over to      carvedilol 6.25 mg b.i.d.  He will follow up in 2 weeks for a BNP,      a complete metabolic panel, CBC, TSH, and blood pressure check.  2. Hypertension.  The patient's blood pressure is under reasonable      control today at 132/86.  We are increasing his lisinopril and      changing him from bisoprolol over to carvedilol.  3. The patient was told he could decrease his aspirin from 325 mg      daily to 81 mg daily.  4. Hyperlipidemia.  The patient has a history of hyperlipidemia, is on      no statin right now.  We will go ahead and check his lipids when he      returns in 2 weeks.  5. We will see the patient back in clinic in 3 months' time.     Marca Ancona, MD  Electronically Signed    DM/MedQ  DD: 11/19/2007  DT: 11/20/2007  Job #: [id number]   cc:   Noralyn Pick. Eden Emms, MD, [facility]

## 2010-08-23 NOTE — Assessment & Plan Note (Signed)
[facility]                            CARDIOLOGY OFFICE NOTE   NAME:Rubano, PAULA ZIETZ                     MRN:          [id number]  DATE:10/17/2006                            DOB:          [birth year]    REFERRING PHYSICIAN:  Sean A. Everardo All, MD   REASON FOR VISIT:  Cardiac followup.   HISTORY OF PRESENT ILLNESS:  I saw Mr. Kidd recently in June.  His  history is detailed in my previous note.  I referred him at that time  for a CT scan of the chest, which showed no evidence of pulmonary  embolus, aortic aneurysm, or aortic dissection.  No pericardial effusion  was noted either.  No obvious masses.  Today, I reassured him about his  followup cardiac and pulmonary testing, which he has had over the last  several months.  He tells me that he saw his orthopedic physician, and  was given treatment with steroids and antiinflammatory medications with  some improvement in his chest/thoracic discomfort.  I suspect this is  musculoskeletal in etiology based on description.  He reports,  otherwise, tolerating his cardiac medications.  He does have a mild  cough, which may due to the lisinopril, but does not want to try an  angiotensin neuroreceptor blocker (not available in generic).  He is  otherwise comfortable with his progress.   ALLERGIES:  NO KNOWN DRUG ALLERGIES.   PRESENT MEDICATIONS:  1. Bisoprolol/hydrochlorothiazide 2.5/6.25 mg p.o. daily.  2. Aspirin 325 mg daily.  3. Lisinopril 5 mg p.o. daily.  4. He uses Robaxin, tramadol, and Restoril p.r.n.   REVIEW OF SYSTEMS:  As described in history of present illness.   EXAMINATION:  Blood pressure is 124/78.  Heart rate is 70.  Weight is  164 pounds.  Patient is comfortable, and in no acute distress.  Examination of the neck reveals no elevated jugular venous pressure  without bruits.  No thyromegaly is noted.  LUNGS:  Clear without labored breathing at rest.  CARDIAC:  Exam reveals a regular  rate and rhythm without murmur, rub, or  gallop.  EXTREMITIES:  Exhibit no significant pitting edema.   IMPRESSION AND RECOMMENDATIONS:  1. History of nonischemic cardiomyopathy as detailed previously with      an ejection fraction of approximately 40%.  My plan is to continue      medical therapy and observation at this point.  I      will plan to see him back over the next 6 months.  2. Further plans to follow.     Jonelle Sidle, MD  Electronically Signed    SGM/MedQ  DD: 10/17/2006  DT: 10/18/2006  Job #: [id number]   cc:   Gregary Signs A. Everardo All, MD

## 2010-08-23 NOTE — Assessment & Plan Note (Signed)
[facility]                            CARDIOLOGY OFFICE NOTE   NAME:[name] [name], [name] [name]                     MRN:          [id number]  DATE:09/20/2006                            DOB:          1950/06/25    PRIMARY CARE PHYSICIAN:  Dr. Gregary Signs A. Everardo All, MD   REASON FOR VISIT:  Chest pain.   HISTORY OF PRESENT ILLNESS:  [name] [name] was seen in the office back in  April.  He has a nonischemic cardiomyopathy with an ejection fraction in  the 35-40% range and has been managed medically.  He called the office  this past Friday and was referred to the hospital, given episodes of  left-sided chest/breast pain. He decided not to go to the hospital and  rather, scheduled a visit to see me today in the office.  He states that  he began experiencing a left-sided chest pain, pressure like, and also  like a dull discomfort that began suddenly on Wednesday when he was  standing still, speaking with someone.  He has had this intermittently  and almost constantly at times although it does not seem to be worsened  by exertion.  He had some Oxycodone at home and took this with some  relief.  He has otherwise been compliant with his cardiac medications.  His electrocardiogram today is normal, showing sinus rhythm at 61 beats  per minute and shows no marked changes compared to his previous tracing.  He had an adenosine Myoview in September of 2007 which showed an  ejection fraction of 40% with abnormal changes at the apex inferior wall  that were similar to prior studies, in fact improved in the inferior  distribution.  His cardiac catheterization in 2004 demonstrated no  significant coronary artery disease.   I discussed the situation with him today.  He has had a mild cough and  also describes a worsening in some of his chest pain symptoms when he  twists in certain positions.  It sounds musculoskeletal and not frankly  cardiac in nature.  I did not think that a  followup stress-test was  clearly necessary at this point and we discussed doing a CT scan of his  chest to make sure there was no other underlying pulmonary etiology or  obvious problem within the chest wall soft tissues.  He had no focal  tenderness to palpation.  Of note, he did have left shoulder surgery and  some of this could be neuropathic pain as well.   ALLERGIES:  No known drug allergies.   PRESENT MEDICATIONS:  Bisoprolol & hydrochlorothiazide 25/6.25 mg p.o.  daily, aspirin 325 mg p.o. daily, lisinopril 5 mg p.o. daily.   REVIEW OF SYSTEMS:  As described in the history of present illness.   EXAMINATION:  VITAL SIGNS:  Blood pressure is 122/88, heart rate is 66,  weight is 163 pounds.  GENERAL:  The patient is comfortable, well nourished and in no acute  distress without active chest pain.  NECK:  Examination reveals no elevated jugular venous pressure, no  bruits, no thyromegaly was noted.  LUNGS:  Clear at  rest.  CARDIAC EXAM:  Reveals a regular rate and rhythm without a murmur, S3  gallop, there is no pericardial rub.  ABDOMEN:  Soft, nontender, normal active bowel sounds.  EXTREMITIES:  Show no significant pitting edema. Distal pulses are 2+.  SKIN:  Tan, warm and dry.  MUSCULOSKELETAL:  No kyphosis noted.  NEURO/PSYCHIATRIC:  Patient is alert and oriented x3.   IMPRESSION/RECOMMENDATIONS:  1. Chest pain, atypical in description. His electrocardiogram is      normal.  He has a history of a nonischemic cardiomyopathy as      outlined above. I plan a CT scan of the chest to exclude other      potential pulmonary or intrathoracic etiologies of his chest pain.      I will have him follow-up with me to discuss these results and we      can determine if any additional studies are needed.  2. Further plans to follow.     Jonelle Sidle, MD  Electronically Signed    SGM/MedQ  DD: 09/20/2006  DT: 09/20/2006  Job #: [id number]   cc:   Gregary Signs A. Everardo All, MD

## 2010-08-23 NOTE — Assessment & Plan Note (Signed)
[facility]                            CARDIOLOGY OFFICE NOTE   NAME:[name] [name], [name] [name]                     MRN:          [id number]  DATE:03/13/2007                            DOB:          [birth year]    REASON FOR VISIT:  Followup cardiac testing.   HISTORY OF PRESENT ILLNESS:  I saw Mr. Milstein back in November.  His  history is detailed in my previous note.  I referred him for followup  ischemic testing, and this demonstrated a stable ejection fraction  graded at 39% with moderate global hypokinesis, no diagnostic ST segment  changes to suggest ischemia, and no frank perfusion evidence of  ischemia.  This is consistent with his previously documented nonischemic  cardiomyopathy and argues against any major progression in  atherosclerosis.  Some of his chest symptoms may well be related to  reflux, and I see that he is now on a proton pump inhibitor.  He has not  been using this regularly.  I reviewed his cardiac medications, which  are outlined below.  He states that his previous leg cramps have  improved.  We did check a BMET after his last visit, showing a normal  potassium of 4.5 and normal BUN and creatinine of 9 and 1.3  respectively.  I did not place him on any potassium supplements at that  time.  He states that he has had some trouble with left knee pain and  also voices limitations due to his left shoulder.  He states he has been  out of work since last April.  He is apparently undergoing assessment  for disability.   ALLERGIES:  No known drug allergies.   PRESENT MEDICATIONS:  1. Bisoprolol/hydrochlorothiazide 2.5/6.25 mg p.o. daily.  2. Aspirin 325 mg p.o. daily.  3. Lisinopril 5 mg p.o. daily.  4. Trazodone 50 mg p.o. nightly.  5. Omeprazole 20 mg p.o. daily to b.i.d. (Has been using p.r.n.)  6. Temazepam 30 mg p.o. nightly.   REVIEW OF SYSTEMS:  As described in the history of present illness.   EXAMINATION:  Blood pressure  141/83, heart rate is 67, weight 166  pounds, which is down 2 pounds from his last visit.  The patient is in no acute distress without active chest pain.  Examination of the neck reveals no elevated jugular venous pressure.  LUNGS:  Clear and without labored breathing.  CARDIAC:  Reveals a regular rate and rhythm.  No S3 gallop.  No loud  murmur.  EXTREMITIES:  Exhibit no pitting edema today.   IMPRESSION/RECOMMENDATIONS:  1. Nonischemic cardiomyopathy with stable ejection fraction of 39% and      no active evidence of ischemia by repeat Myoview recently.  I      suggest continued medical therapy, which now includes aspirin,      bisoprolol hydrochlorothiazide, and lisinopril.  Mr. Leeman is now      following up in the [facility] in [place].  I have asked him to      continue to have his blood pressure assessed there and, if he      remains  hypertensive, he may need further adjustments in his      medical regimen.  Otherwise, I do not anticipate any additional      ischemic testing at this point, and will plan to see him back in      the next 6 months.  2. Further plan is to follow.     Jonelle Sidle, MD  Electronically Signed    SGM/MedQ  DD: 03/13/2007  DT: 03/13/2007  Job #: [id number]   cc:   [facility] - [address]

## 2010-08-23 NOTE — Assessment & Plan Note (Signed)
[facility]                            CARDIOLOGY OFFICE NOTE   NAME:Mccay, WILMER SANTILLO                     MRN:          [id number]  DATE:02/20/2007                            DOB:          07/06/1950    PRIMARY CARE PHYSICIAN:  Sean A. Everardo All, M.D.   REASON FOR VISIT:  Patient phone call with multiple complaints.   HISTORY OF PRESENT ILLNESS:  Mr. Corsino was last seen in the office  back in July.  He has a history of a nonischemic cardiomyopathy with an  ejection fraction of approximately 40%.  We have been managing him  medically.  He also has intermittent musculoskeletal/neuropathic  discomfort effecting his arms and chest at times.  I have referred him  for previous assessments, including a CT scan of his chest which showed  no evidence of pulmonary embolus, aortic aneurysm, or aortic dissection,  and he had an ischemic evaluation a little over one year ago in  September, 2007 that was abnormal, although not particularly changed  from previous studies.  He called to the office reporting a number of  issues, including leg cramps which have been particularly bothersome at  nighttime.  Otherwise, he is not having any claudication or pain when he  exercises at the gym with his legs.  He has also noted some episodes of  chest pressure, one of which occurred in the morning when he woke up and  lasted over half a day and another which occurred a few hours after  eating breakfast.  He states that he has been somewhat more short of  breath.  He reports compliance with his medications.  His weight has  steadily been increasing over the last few months.  He is not having any  orthopnea or PND.  He has had no palpitations or syncope.   ALLERGIES:  No known drug allergies.   PRESENT MEDICATIONS:  1. Bisoprolol/hydrochlorothiazide 26/6.25 mg p.o. daily.  2. Aspirin 325 mg p.o. daily.  3. Lisinopril 5 mg p.o. daily.  4. Septra 800 mg p.o. b.i.d.  5.  Doxycycline 150 mg p.o. daily.  6. Trazodone 50 mg p.o. nightly.  7. Creatine drink supplement.   REVIEW OF SYSTEMS:  As described in the history of present illness.  He  had a skin cancer taken off of his back and is on antibiotics recently.   PHYSICAL EXAMINATION:  Blood pressure is 136/82, heart rate 65, weight  168 pounds.  Patient is comfortable in no acute distress.  Without active chest pain.  HEENT:  Conjunctivae is normal.  Oropharynx is clear.  NECK:  Supple without elevated jugular venous distention or loud bruits.  No thyromegaly.  LUNGS:  Clear without labored breathing.  CARDIAC:  Regular rate and rhythm.  No S3 gallop, no pericardial rub.  ABDOMEN:  Soft, no bruits.  Bowel sounds present.  EXTREMITIES:  No significant pitting edema.  Skin is warm and dry.  MUSCULOSKELETAL:  No kyphosis is noted.  NEUROPSYCHIATRIC:  Patient is alert and oriented x3.  Affect is normal.   Electrocardiogram shows sinus rhythm, decreased R wave progression,  although no frank Q waves anteriorly, otherwise no significant changes.   IMPRESSION/RECOMMENDATIONS:  1. Recent episodes of chest discomfort, possibly gastrointestinal,      based on description, although he has not had any follow-up      ischemic testing over the last year.  He has decreased R wave      progression anteriorly, although without frank Q waves.  I had      recommended an Adenosine Myoview for followup to exclude any      significant evidence of ischemia.  In the past, he had no      obstructive coronary artery disease noted at catheterization,      although it has been at least four years ago.  Some of his symptoms      of chest pain may be related to reflux.  I have therefore      recommended that he take a proton pump inhibitor to see if this      makes any improvement.  He will obtain one over the counter to      start, and if this is effective, he may need a prescription.  2. Regarding the leg cramping, he is  only      bisoprolol/hydrochlorothiazide without potassium supplement.  I      would like to make sure that he is not hypokalemic.  If so, he will      clearly need a potassium supplement, and this may actually help      with some of his symptoms.  3. I would like to have him come back to see me over the next few      weeks, and we can piece all of this together.     Jonelle Sidle, MD  Electronically Signed    SGM/MedQ  DD: 02/20/2007  DT: 02/21/2007  Job #: [id number]   cc:   Gregary Signs A. Everardo All, MD

## 2010-08-23 NOTE — Assessment & Plan Note (Signed)
[name]                            CARDIOLOGY OFFICE NOTE   NAME:[name] [name], [name] [name]                     MRN:          [id number]  DATE:03/03/2008                            DOB:          01/04/1951    PRIMARY CARE PHYSICIAN:  None.   HISTORY OF PRESENT ILLNESS:  This is a 60 year old with a history of  nonischemic cardiomyopathy who returns to our Cardiology Clinic for  evaluation.  The patient, in general, has been doing well.  However, he  says he sometimes has episodes of shortness of breath that tend to come  on with exertion.  He states that sometimes when he carries groceries up  the steps to his apartment, he will become short of breath, sometimes  just walking 30 feet, he can become short of breath.  However, most of  the time, he is able to walk on flat ground without any trouble  whatsoever.  He is able to climb a flight of steps to his apartment  without difficulty and he occasionally goes to dancing and is able to  dance without any significant shortness of breath.  He does not have  orthopnea.  He does not have PND.  Aerobic activity is limited because  of the osteoarthritis of his knee.  He does continue to get sharp pains  in his left chest that last only seconds at a time.  They are daggerlike  and quite atypical.  They are not related to exertion.  Of note, he did  have a Myoview done in November 2008, that showed no ischemia.   PAST MEDICAL HISTORY:  1. Nonischemic cardiomyopathy.  The patient had a left heart      catheterization done in March 2004 with normal coronaries and then      in November 2008, he had a Myoview done that showed an EF of 39%      with no ischemia.  Echo in April 2008 showed an EF of 35-40%,      moderate diffuse hypokinesis, mild left atrial enlargement, normal      RV size and function and essentially normal valves.  The cause of      his cardiomyopathy has not been discovered.  He has never been a  heavy drinker.  He has never used cocaine and amphetamines.  He has      never had an HIV test done.  2. Hypercholesterolemia.  The patient has been on Lipitor in the past,      but could not tolerate and had to stop it.  He has not had recent      lipids done.  I tried to get him to come into the office few months      ago.  However, he missed his appointment for blood work.  3. Rotator cuff tendonitis with a history of bilateral shoulder      surgery.  4. Hypertension.  5. History of a back surgery.  6. Knee osteoarthritis.  7. Gastroesophageal reflux disease.   MEDICATIONS:  1. Lisinopril 10 mg daily.  2. Aspirin 81 mg daily.  3.  Coreg 6.25 mg b.i.d.   SOCIAL HISTORY:  The patient denies smoking.  He does not use any drugs.  He has never used any illicit drugs other than marijuana, where he  smoked occasionally in the distant past.  He rarely drinks alcohol and  was never heavy drinker.   EKG today normal sinus rhythm.  It is a normal EKG.   PHYSICAL EXAMINATION:  VITAL SIGNS:  Blood pressure 141/83, heart rate  63 and regular, weight is 178 pounds.  GENERAL:  This is a well-developed male in no apparent distress.  NECK:  No JVD.  There is no carotid bruit.  LUNGS:  Clear to auscultation bilaterally.  CARDIOVASCULAR:  Heart regular.  S1 and S2.  No S3.  No S4.  No murmur.  There is no carotid bruits.  There is no peripheral edema.  There are 2+  posterior tibial pulses bilaterally.  ABDOMEN:  Soft and tender.  No hepatosplenomegaly.  EXTREMITIES:  No clubbing or cyanosis.   ASSESSMENT AND PLAN:  This is a 60 year old with a history of  nonischemic cardiomyopathy and atypical chest pain, visits Cardiology  Clinic for reevaluation.  1. Congestive heart failure.  The patient's most recent ejection      fraction was 39% on a Myoview from November 2008.  There has been      no evidence of coronary artery disease on heart catheterization or      Myoview.  Etiology of his  cardiomyopathy is unknown.  There is no      history of significant alcohol or drug abuse.  His blood pressure      is under reasonable control.  We will have him come back to the      office for blood draw to check an serum protein electrophoresis and      a urine protein electrophoresis to rule out evidence of amyloid.      We will check iron studies to rule out hemochromatosis and we will      also get an human immunodeficiency virus test to rule out an human      immunodeficiency virus cardiomyopathy.  The patient is euvolemic on      exam today.  His symptoms are somewhat nebulous, but seem to be      probably New York Heart Association class II.  We will plan to      increase in his lisinopril to 10 mg twice a day from 10 mg daily      and continue his carvedilol 6.25 mg b.i.d.  We will have him follow      up in 2 weeks for a blood pressure check as well as a complete      metabolic panel.  If his blood pressure is stable, we will increase      his Coreg to 12.5 mg b.i.d. at that time.  2. The patient's blood pressure is a bit elevated today.  We are      planning on increasing his lisinopril to 10 mg b.i.d.  3. Hyperlipidemia.  We will check the patient's lipids when he comes      back in 2 weeks.  4. We will obtain the patient a primary care physician.     Marca Ancona, MD  Electronically Signed    DM/MedQ  DD: 03/03/2008  DT: 03/03/2008  Job #: [id number]

## 2010-08-26 NOTE — Assessment & Plan Note (Signed)
[facility]                            CARDIOLOGY OFFICE NOTE   NAME:[name] [name], [name] [name]                     MRN:          [id number]  DATE:06/21/2006                            DOB:          Sep 14, 1950    REASON FOR VISIT:  Follow up cardiomyopathy.   HISTORY OF PRESENT ILLNESS:  I last saw [name] [name] back in September.  He has a history of a non-ischemic cardiomyopathy with most recent  ejection fraction at approximately 50% based on testing from September  of last year. He states that he has been out of work since this past May  and has gained approximately 15 pounds. He has been less active. He has  noted dyspnea on exertion and has had some brief episodes of chest  discomfort. He has previously documented reassuring coronary anatomy at  catheterization in 2004, demonstrating no significant coronary artery  disease. He reports being compliant with his medications. He was,  apparently, supposed to have a physical with Dr. Everardo All earlier today,  but cancelled this visit. He states that he has also had a dry cough  that has been somewhat nagging.   ALLERGIES:  No known drug allergies.   CURRENT MEDICATIONS:  1. Avapro 75 mg daily.  2. Bisoprolol HCT 2.5/6.25 mg daily.  3. Aspirin 325 mg daily.   REVIEW OF SYSTEMS:  As described in the history of present illness.   PHYSICAL EXAMINATION:  VITAL SIGNS: Blood pressure 112/80, heart rate  73, weight 173 pounds up from 173 back in September.  GENERAL: The patient is comfortable and in no acute distress.  NECK: Reveals no marked elevated jugular venous pressure. No bruits  noted. No thyromegaly.  LUNGS: Generally clear, no wheezing, rales, or egophony.  CARDIAC: Regular rate and rhythm, no loud S3 gallop or pericardial rub.  ABDOMEN: Soft and nontender.  EXTREMITIES: No pitting edema.   12 lead electrocardiogram shows normal sinus rhythm at 69 beats-per-  minute.   IMPRESSION/RECOMMENDATIONS:  1. History of non-ischemic cardiomyopathy. He has had some progressive      symptomatology as discussed above and our plan will be a followup      echocardiogram to reassess left ventricular function, as well as a      BNP. He may need further medication adjustments, particularly if he      has had a decrease in his function, although some of this could be      deconditioning. He has not had a clear ischemic component to this      cardiomyopathy based      on previous assessment. I will have him follow up to review these      results.  2. Further plan is to follow up.     Jonelle Sidle, MD  Electronically Signed    SGM/MedQ  DD: 06/21/2006  DT: 06/23/2006  Job #: [id number]   cc:   Gregary Signs A. Everardo All, MD

## 2010-08-26 NOTE — Op Note (Signed)
[name], [name] [name]                          ACCOUNT NO.:  [id number]   MEDICAL RECORD NO.:  [id number]                   PATIENT TYPE:  AMB   LOCATION:  DSC                                  FACILITY:  [facility]   PHYSICIAN:  Nadara Mustard, M.D.                DATE OF BIRTH:  31-Jul-1950   DATE OF PROCEDURE:  05/21/2003  DATE OF DISCHARGE:                                 OPERATIVE REPORT   PREOPERATIVE DIAGNOSIS:  Medial meniscal tear, left knee.   POSTOPERATIVE DIAGNOSIS:  1. Large osteochondral defect medial femoral condyle, left knee.  2. Degenerative lateral meniscal tear, left knee.   PROCEDURE:  1. Debridement of osteochondral defect, left knee, medial femoral condyle.  2. Debridement lateral meniscal tear.   SURGEON:  Nadara Mustard, M.D.   ANESTHESIA:  Knee block.   ESTIMATED BLOOD LOSS:  Minimal.   ANTIBIOTICS:  None.   DRAINS:  None.   COMPLICATIONS:  None.   DISPOSITION:  To the PACU in stable condition.   INDICATIONS FOR PROCEDURE:  The patient is a 60 year old gentleman who  presented with mechanical symptoms of catching and locking with pain over  the medial joint line.  He states that the pain is now radiating from the  medial to the lateral joint line.  The patient has had an MRI scan which  showed a medial meniscal tear and the patient presents at this time for  arthroscopic intervention after failure of conservative tear.  The risks and  benefits were discussed including infection, neurovascular injury,  persistent pain, need for additional surgery.  The patient states he  understands and wishes to proceed at this time.   DESCRIPTION OF PROCEDURE:  The patient was brought to OR room 5 after  undergoing a knee block.  After an adequate level of anesthesia was  obtained, the patient's left lower extremity was prepped using DuraPrep and  draped in a sterile field.  The scope was inserted through the inferolateral  portal and a working portal was  established inferomedial.  Visualization of  the medial joint line showed a large delaminating osteochondral defect of  the cartilage of the medial femoral condyle.  Using the shaver and the ring  curet, this was debrided back to bleeding viable subchondral bone.  The  medial meniscus was shortened and attenuated but was intact and was probed  and this was stable, apparently, secondary to a previous medial meniscal  surgery.  The patient had a significant amount of synovitis and this was  debrided.  Examination of the notch showed an intact ACL.  Examination of  the lateral joint line in the figure-of-four position showed degenerative  tearing of the lateral meniscus and this was debrided with the shaver.  The  patellofemoral joint was then examined and the patient had no articular  cartilage defects of the patella or trochlea.  There was no defects of the  lateral femoral condyle or the lateral tibial plateau either.  Examination  of both gutters showed there to be no loose bodies.  The compartments were  then re-evaluated.  There were no loose bodies.  The instruments were  removed. The portals were closed using 4-0 nylon.  The wound was covered  with Adaptic, orthopedic sponges, ABD dressings, sterile Webril, and a Coban  dressing.  The patient was then taken to the PACU in stable condition with  plans for progressive ambulation, weight-bearing as tolerated, change the  dressing in three days.  Plan to follow up in two weeks.                                               Nadara Mustard, M.D.    MVD/MEDQ  D:  05/21/2003  T:  05/21/2003  Job:  [id number]

## 2010-08-26 NOTE — Op Note (Signed)
NAME:  [name] [name], [name] [name]              ACCOUNT NO.:  [id number]   MEDICAL RECORD NO.:  [id number]          PATIENT TYPE:  AMB   LOCATION:  DAY                          FACILITY:  [facility]   PHYSICIAN:  Georges Lynch. Gioffre, M.D.DATE OF BIRTH:  [birth year]   DATE OF PROCEDURE:  09/13/2005  DATE OF DISCHARGE:                                 OPERATIVE REPORT   SURGEON:  Georges Lynch. Darrelyn Hillock, M.D.   OPERATIONS:  Jamelle Rushing, P.A.-C.   PREOPERATIVE DIAGNOSIS:  Complete rotator cuff tendon tear with retraction  on the left.   POSTOPERATIVE DIAGNOSIS:  Complete rotator cuff tendon tear with retraction  on the left.   OPERATION:  1.  Partial acromionectomy acromioplasty left shoulder.  2.  Repair of a completely retracted and torn rotator cuff tendon left      shoulder utilizing a Restore double thickness graft with five Mitek      sutures.   PROCEDURE:  Under general anesthesia, routine orthopedic prep and draping of  the left upper extremity was carried out.  The patient had 1 gram of IV  Ancef preop.  An incision was made over the anterior aspect of the left  shoulder.  Bleeders were identified and cauterized.  I then stripped the  deltoid tendon from the acromion by sharp dissection.  I split the proximal  portion of the deltoid muscle.  Self-retaining retractors were inserted.  I  then went down and excised the subdeltoid bursa.  I did a partial  acromionectomy and acromioplasty utilizing the oscillating saw and the bur.  At this time I noted he had a large irregular degenerative traumatic type  tear of the rotator cuff that was retracted.  I utilized the bur to even out  the lateral articular surface of the humerus and obtained good bleeding  bone.  I then inserted three Mitek sutures, brought the graft forward, and  sutured it in place.  Following that, I then reinforced the graft by putting  two more Mitek sutures just distal to the proximal three that we had in  previously.  I then  sutured a double thickness Restore graft into the tendon  and anchored it to the bone with the Mitek sutures.  We had a nice solid  repair.  I thoroughly irrigated out the area.  I bone waxed the undersurface  of the acromion and I then inserted some thrombin soaked Gelfoam.  I then  reapproximated the deltoid tendon and muscle in the usual fashion.  The skin  was closed with metal staples.  Sterile Neosporin dressing was applied.  He  was then placed in a shoulder immobilizer.           ______________________________  Georges Lynch Darrelyn Hillock, M.D.     RAG/MEDQ  D:  09/13/2005  T:  09/13/2005  Job:  [id number]

## 2010-08-26 NOTE — Cardiovascular Report (Signed)
   NAMESANAV, REMER                          ACCOUNT NO.:  [id number]   MEDICAL RECORD NO.:  [id number]                   PATIENT TYPE:  INP   LOCATION:  3741                                 FACILITY:  [facility]   PHYSICIAN:  Charlton Haws, M.D. [facility]              DATE OF BIRTH:  October 08, 1950   DATE OF PROCEDURE:  06/23/2002  DATE OF DISCHARGE:                              CARDIAC CATHETERIZATION   PROCEDURE:  Coronary arteriography.   INDICATION:  Chest pain and abnormal Cardiolite study suggesting inferior  wall ischemia with EF of 36%.   DESCRIPTION OF PROCEDURE:  Catheterization was done from the right femoral  artery.   FINDINGS:  Left main coronary artery was normal.   Left anterior descending artery was normal.   Circumflex coronary artery was normal.   Right coronary artery was dominant and normal.   RIGHT ANTERIOR OBLIQUE VENTRICULOGRAPHY:  RAO ventriculography showed mild-  to-moderate LV cavity dilatation with global hypokinesis.  The ejection  fraction was in the 35-40% range.  There was no MR.  LV pressure was 120/8.  Aortic pressure was 122/70.   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  He will  be treated medically.  I suspect it would be reasonable to do a followup  MUGA scan or x-ray in six months.   He will be started on Altace 2.5 mg a day; this can be titrated according to  blood pressure.  I suspect he will be able to go home in the morning.                                               Charlton Haws, M.D. [facility]    PN/MEDQ  D:  06/23/2002  T:  06/23/2002  Job:  [id number]   cc:   Gregary Signs A. Everardo All, M.D. [facility]

## 2010-08-26 NOTE — Assessment & Plan Note (Signed)
[facility]                              CARDIOLOGY OFFICE NOTE   NAME:Nowling, JAREMY NOSAL                     MRN:          [id number]  DATE:01/02/2006                            DOB:          03/23/1951    PRIMARY CARE PHYSICIAN:  Sean A. Everardo All, M.D.   REASON FOR VISIT:  Follow-up cardiac testing and preoperative assessment.   HISTORY OF PRESENT ILLNESS:  I saw Mr. Hern back in August.  I referred  him at that time for a follow-up myocardial perfusion study as well as  echocardiogram.  He has a previously documented history of nonischemic  cardiomyopathy with prior ejection fraction in the 35-45% range and cardiac  catheterization in 2004 demonstrating no significant coronary artery  disease.  He had experienced some dyspnea on exertion as well as  intermittent chest pain in the meanwhile.  He underwent an Adenosine-Myoview  on the 7th of this month which revealed a partially reversible apical defect  as well as fixed basal inferolateral defect with borderline transient  ischemic dilatation and a left ventricular ejection fraction calculated at  40%.  Comparison with prior study revealed similar findings at the apex with  improved perfusion at this point in the inferior wall.  Echocardiography  also on the 7th of September revealed an ejection fraction of 50% with no  regional wall motion abnormalities and no major valvular abnormalities.  This improved when an ejection fraction was noted since prior study in 2006.  Symptomatically, Mr. Haskin still has some dyspnea on exertion, although  had NYHA class II severity and actually he states this has been less  noticeable.  He has not had any marked episodes of chest pain.  I reviewed  the results today and went over his medical regimen.  He seems to be  tolerating Avapro better than lisinopril with improvement in his cough.  He  has had no orthopnea, PND or lower extremity edema.  He is being  considered  for left shoulder surgery and at this point we discussed proceeding on with  plans for surgery at an acceptable cardiovascular risk.   ALLERGIES:  No known drug allergies.   PRESENT MEDICATIONS:  1. Bisoprolol/HCTZ 2.5/6.25 mg p.o. q.a.m.  2. Avapro 75 mg (1/2 tablet) p.o. q.p.m.   REVIEW OF SYSTEMS:  As described in history of present illness.   PHYSICAL EXAMINATION:  Blood pressure today is 114/77, heart rate is 65.  Weight is 171 pounds, which is up from 161 back in May of this year.  The  patient is comfortable and in no acute distress.  Examination of the neck  reveals no elevated jugular venous pressure.  Lungs are clear without  labored breathing or rales.  Cardiac exam reveals a regular rate and rhythm  without S3 gallop.  Extremities show no significant pitting edema.   IMPRESSION/RECOMMENDATIONS:  1. History of nonischemic cardiomyopathy, although with recent assessment      showing improvement in resting left ventricular ejection fraction to      50%.  Myocardial perfusion study was abnormal as described above,  however, did not demonstrate large areas of ischemia and actually      showed some improved perfusion compared to prior studies.  Cardiac      catheterization in 2004 did not demonstrate any significant coronary      atherosclerosis.  At this point, I have recommended that he continue      his present medical regimen.  He should be able to proceed with planned      left shoulder surgery at an acceptable cardiac risk.  Clearly if his      clinical situation changes, we can see him in the perioperative      setting.  Otherwise, I will plan to see him back approximately one      month after surgery.  2. Further plans to follow.                                Jonelle Sidle, MD    SGM/MedQ  DD:  01/02/2006  DT:  01/04/2006  Job #:  [id number]   cc:   Gregary Signs A. Everardo All, MD  [facility]el[facility]ow

## 2010-08-26 NOTE — H&P (Signed)
NAME:  [name] [name], PALMA NO.:  [id number]   MEDICAL RECORD NO.:  [id number]                   PATIENT TYPE:  EMS   LOCATION:  MAJO                                 FACILITY:  [facility]   PHYSICIAN:  Jonelle Sidle, M.D. [facility]        DATE OF BIRTH:  05/30/1950   DATE OF ADMISSION:  06/21/2002  DATE OF DISCHARGE:                                HISTORY & PHYSICAL   CHIEF COMPLAINT:  History of chest discomfort.   HISTORY OF PRESENT ILLNESS:  The patient is a pleasant 60 year old male with  a history of dyslipidemia, who has had a variety of types of chest  discomfort over the last several weeks. He describes a pin-like sensation at  times, but also a pressure in the left side of his chest that is not  specifically precipitated by exertion.  A little over a week ago, he did  have a more intense episode of chest discomfort at rest that ultimately  precipitated further evaluation by Gregary Signs A. Everardo All, M.D. [facility].  The patient  underwent an exercise Cardiolite on March 9, which showed no diagnostic  electrocardiographic changes at a maximum work load of 15.3 mets, however,  perfusion imaging suggested both mild apical and inferior defects consistent  with ischemia with a calculated ejection fraction of 36%.  Dr. Everardo All  contacted the patient regarding the results and suggested that he present to  the emergency department.  The patient today is denying any chest discomfort  and we were asked to evaluate him further in terms of additional cardiac  testing.  The patient denies any prior history of coronary artery disease or  myocardial infarction.  He tries to stay fairly active, working out at [employer] and also dancing regularly. He denies any specific exertional chest  discomfort, fatigue, or dyspnea. He does get some leg discomfort at times  without frank claudication symptoms.  He also denies palpitations and  syncope.   ALLERGIES:  No known drug allergies.   MEDICATIONS:  He as recently started on Mevacor.  He otherwise takes  Creatine, but no other prescription medications.   PAST MEDICAL HISTORY:  1. Dyslipidemia, unknown lipid status profile, recently started on Mevacor.  2. History of previous rotator cuff knee and back surgeries.  3. Prior history of nephrolithiasis.  4. Recently abnormal Cardiolite on March 9, as outlined above.   SOCIAL HISTORY:  The patient lives in [place] with his brother. He denies  tobacco and illicit drug use. He does drink on the weekends. He works as a  Production designer, theatre/television/film in [employer].   FAMILY HISTORY:  Significant for coronary artery disease.   REVIEW OF SYSTEMS:  As described in history of present illness.  He also  complains of some paresthesias in his arms when he wakes up at nighttime  potentially due to position changes.  He does deny specific arm discomfort  with exertion and claudication symptoms.  PHYSICAL EXAMINATION:  VITAL SIGNS: Temperature 97.6 degrees, heart rate 64  and regular, respirations 23, blood pressure 125/74, oxygen saturation 99%  on room air.  GENERAL:  This is a well-nourished, well-developed male in no acute  distress.  HEENT:  Conjunctivae and lids normal. Oropharynx clear.  NECK:  Supple without elevated jugular venous pressure or carotid bruits.  LUNGS:  Clear to auscultation bilaterally with unlabored breathing.  HEART: Regular rate and rhythm with nondisplaced PMI, no S3 gallop, and no  significant murmur. There is no pericardial rub noted.  ABDOMEN:  Soft with normal active bowel sounds and no obvious  hepatosplenomegaly.  EXTREMITIES:  No significant cyanosis, clubbing, or edema.  Peripheral  pulses are 2+.   12-lead electrocardiogram shows sinus rhythm at 71 beats per minute. There  is a rightward axis and the suggestion of potential lead reversal.  There  are nonspecific T wave changes, otherwise.   LABORATORY DATA:  Pending currently as is chest x-ray.    IMPRESSION:  1. Chest pain syndrome with a more intense episode at rest approximately one     week ago, in a 60 year old male with history of dyslipidemia and     potentially family history of coronary artery disease.  Recent outpatient     Cardiolite suggests both mild apical and inferior defects consistent with     ischemia and ejection fraction of 36%.  2. Reported history of dyslipidemia, recently started on Mevacor. Unknown     formal lipid profile at this time.   PLAN:  1. Will admit the patient and cycle cardiac markers.  Would begin aspirin at     this time and check a fasting lipid profile.  Will continue statin     therapy for the timebeing. Heparin to be added if abnormal enzymes or the     patient has any recurrent symptomatology. He has been pain-free for the     last several days.  2. Anticipate diagnostic coronary angiography on Monday to define the     coronary anatomy and assess for any potential revascularization options.     I could make further plans based on this.                                               Jonelle Sidle, M.D. [facility]    SGM/MEDQ  D:  06/21/2002  T:  06/21/2002  Job:  [id number]

## 2010-08-26 NOTE — Assessment & Plan Note (Signed)
[facility]                              CARDIOLOGY OFFICE NOTE   NAME:Render, DAMACIO WEISGERBER                     MRN:          [id number]  DATE:12/05/2005                            DOB:          [birth year]    PRIMARY CARE PHYSICIAN:  Sean A. Everardo All, M.D.   REASON FOR VISIT:  Recent chest pain and shortness of breath.   HISTORY OF PRESENT ILLNESS:  I saw Mr. Willcutt back in May.  His history is  outlined in my previous note.  He subsequently underwent left shoulder  surgery for rotator cuff repair and stated this went well.  He is, however,  still recuperating and has limited motion in his left shoulder.  He tells me  he is undergoing therapy at this time.  Over the last few weeks, he reports  problems with dyspnea on exertion out of proportion to activity, at least  NYHA Class II in description and also intermittent chest discomfort that  does not appear to be clearly exertional.  In fact, had a recent six-hour  episode of mild to moderate intensity.  He had a previously reassuring  cardiac catheterization in March 2004 demonstrating no significant coronary  artery disease.  He has not had followup ischemic testing since that time.  His electrocardiogram today shows normal sinus rhythm at 67 beats per  minute.  Today I reviewed his medications and he tells me he has had a dry  cough, raising the possibility of relation to lisinopril.  He thinks he may  have had the same problem on Mavik in the past in retrospect.  He otherwise  reports compliance to his medications.  He has had no productive cough or  hemoptysis.   ALLERGIES:  No known drug allergies.   PRESENT MEDICATIONS:  1. Lisinopril 2.5 mg p.o. daily.  2. Bisoprolol/HCT 2.5/6.25 mg p.o. daily.   REVIEW OF SYSTEMS:  As described in the history of present illness.  He has  no orthopnea, palpitations, or syncope.  He denies lower extremity edema.   PHYSICAL EXAMINATION:  VITAL SIGNS:  Blood  pressure 132/76, heart rate 67,  weight 169 pounds up from 161 on his last visit.  GENERAL:  The patient is comfortable at rest, denying active symptoms.  NECK:  No elevated jugular venous pressure or loud bruits.  LUNGS:  Clear without labored breathing.  CARDIAC:  Regular rate and rhythm without obvious S3 gallop, pericardial rub  or loud murmur.  EXTREMITIES:  No pitting edema.   IMPRESSION/RECOMMENDATIONS:  1. Previously documented history of nonischemic cardiomyopathy with an      ejection fraction of 35-45%.  He has had recent dyspnea on exertion as      well as intermittent chest pain.  The electrocardiogram is stable.  At      this point I plan to discontinue his lisinopril in favor of Avapro and      also follow up with a 2-D echocardiogram as well as an adenosine      Myoview to reassess cardiac function and exclude development of      ischemic heart  disease.  I will plan      to have him come back over the next few weeks to review these tests.  2. Further plans to follow.                                Jonelle Sidle, MD    SGM/MedQ  DD:  12/05/2005  DT:  12/06/2005  Job #:  [id number]   cc:   Gregary Signs A. Everardo All, MD

## 2010-08-26 NOTE — Discharge Summary (Signed)
[name] [name], KEENA                          ACCOUNT NO.:  [id number]   MEDICAL RECORD NO.:  [id number]                   PATIENT TYPE:  INP   LOCATION:  3741                                 FACILITY:  [facility]   PHYSICIAN:  Charlton Haws, M.D. [facility]              DATE OF BIRTH:  1950-06-20   DATE OF ADMISSION:  06/21/2002  DATE OF DISCHARGE:  06/24/2002                           DISCHARGE SUMMARY - REFERRING   DISCHARGE DIAGNOSES:  1. Chest pain, resolved.  2. Non-ischemic cardiomyopathy with an ejection fraction of 35%-40%.  3. Hyperlipidemia, on Mevacor.  4. History of nephrolithiasis.  5. No history of coronary artery disease.   HISTORY OF PRESENT ILLNESS:  The patient is a 60 year old male patient with  no known history of coronary artery disease, who was awakened on the day of  admission by substernal chest pain, associated with a flushing sensation,  which lasted for about 30 minutes.  He described the pain as crampy and  spasm-like.  He was admitted to [facility] for further  evaluation.   HOSPITAL COURSE:  His electrocardiogram on admission revealed a normal sinus  rhythm, rate of 68, with nonspecific anterolateral ST-T wave changes.  He  had actually undergone a previous rest stress Cardiolite on June 17, 2002,  in the office, which revealed apical and inferior ischemia with an ejection  fraction of 36%.  As a result of this and his symptoms, he was scheduled for  a cardiac catheterization.  The catheterization was performed on June 23, 2002.  He was found to have  normal coronaries.  His ejection fraction was calculated at 35%-40% with no  MR.  Global hypokinesis with moderate dilatation.  At this point he was felt  to have a diagnosis of non-ischemic cardiomyopathy, and was begun on an ACE  inhibitor.   LABORATORY DATA:  Lab studies during this hospital stay included a sodium of  ____, BUN 15, creatinine 1.2.  Hemoglobin 15.6, hematocrit 43.0,  platelets  161, white count 5.8.  The maximum CK was 269 with 2.5 MB fraction,  troponins negative.   DISPOSITION:  The patient is being discharged to home on June 24, 2002, in  stable condition.   DISCHARGE MEDICATIONS:  1. Baby aspirin one tab daily.  2. Altace 2.5 mg daily.  3. He is to continue his Mevacor which he is being followed by Dr. Gregary Signs A.     Everardo All.  4. He may utilize Tylenol one to two tab q.6h. p.r.n. pain.   DISCHARGE INSTRUCTIONS:  No strenuous activity for two days, and then  gradually increase activity.   DIET:  Remain on a low-fat diet.   WOUND CARE:  Clean the catheterization site with soap and water.  Call for  questions or concerns.    FOLLOW UP:  Dr. Vernie Shanks DeGent's office will call the patient with a follow-  up appointment in approximately three  months.      Guy Franco, P.A. [facility]                      Charlton Haws, M.D. [facility]    LB/MEDQ  D:  06/24/2002  T:  06/24/2002  Job:  [id number]   cc:   Gregary Signs A. Everardo All, M.D. [facility]   Lewayne Bunting, M.D.

## 2010-08-26 NOTE — Assessment & Plan Note (Signed)
[facility]                            CARDIOLOGY OFFICE NOTE   NAME:[name] [name], [name] [name]                     MRN:          [id number]  DATE:07/30/2006                            DOB:          [birth year]    PRIMARY CARE PHYSICIAN:  Dr. Romero Belling.   REASON FOR VISIT:  Followup cardiac testing.   HISTORY OF PRESENT ILLNESS:  I saw [name] [name] back in March.  He has a  history of a nonischemic cardiomyopathy.  I referred him for a followup  echocardiogram as well as baseline BNP level.  He had been experiencing  some fairly atypical chest pain and some dyspnea on exertion.  His BNP  level was completely normal at 9 and his followup echocardiogram  demonstrated a left ventricular ejection fraction of 35-40% with  moderate hypokinesis.  I reviewed these studies with him today.  Generally, despite this, he states that he has tried to remain fairly  active.  He works out at [employer] on a daily basis.  He has some  orthopedic difficulties with his left shoulder area which limits him but  he otherwise is able to do other exercises.  His description of chest  pain is very atypical with pin-like intermittent left-sided pain that  has not progressed.  Today, we talked about his testing and I  recommended some medication adjustments going forward.   ALLERGIES:  NO KNOWN DRUG ALLERGIES.   PRESENT MEDICATIONS:  1. Aspirin 325 mg p.o. daily.  2. Lisinopril 2.5 mg p.o. daily.  3. Bisoprolol HCT 2.5/6.25 mg p.o. daily.   REVIEW OF SYSTEMS:  As described in the history of present illness.   EXAMINATION:  VITAL SIGNS:  Blood pressure is 126/86, heart rate is 60,  weight is 165 pounds down from 173.  GENERAL:  The patient is comfortable and in no acute distress.  NECK:  Examination of the neck reveals no elevated jugular venous  pressure.  CHEST:  Lungs are clear without labored breathing.  Cardiac exam reveals  a regular rate and rhythm without murmur, rub, or  gallop.  EXTREMITIES:  Show no pitting edema.   IMPRESSION:  1. History of nonischemic cardiomyopathy with ejection fraction of      approximately 35-40%.  I will plan to increase his lisinopril to 5      mg daily and otherwise have him return for symptom      followup over the next 4 months.  2. Otherwise continue regular followup with Dr. Everardo [name].     [name] Sidle, MD  Electronically Signed    SGM/MedQ  DD: 07/30/2006  DT: 07/30/2006  Job #: [id number]   cc:   [name] Signs A. Everardo All, MD

## 2010-09-21 ENCOUNTER — Telehealth: Payer: Self-pay | Admitting: Gastroenterology

## 2010-09-21 MED ORDER — ESOMEPRAZOLE MAGNESIUM 40 MG PO CPDR: 40.0000 mg | DELAYED_RELEASE_CAPSULE | Freq: Every day | ORAL | Status: DC

## 2010-09-21 NOTE — Telephone Encounter (Signed)
Notified pt I left him Nexium samples at the front desk.

## 2010-10-31 ENCOUNTER — Telehealth: Payer: Self-pay | Admitting: Cardiology

## 2010-10-31 NOTE — Telephone Encounter (Signed)
Per pt call. Pt needs  inspra 25 mg RX called in. Pt said Ann orders RX for pt through company and pt comes in to pick it up.

## 2010-11-03 ENCOUNTER — Telehealth: Payer: Self-pay

## 2010-11-03 NOTE — Telephone Encounter (Signed)
I talked with pt. Pt is aware that I have reordered Inspra for him. I have mailed him a new application to complete for next reorder. Pt is aware he needs to provide financial information with new application.

## 2010-11-03 NOTE — Telephone Encounter (Signed)
5 boxes of samples left at the front desk for the pt he is aware

## 2010-11-03 NOTE — Telephone Encounter (Signed)
inspra 25 mg. Patient assistance. 7-8 pills left. Pt comes to office to pick up.

## 2010-11-03 NOTE — Telephone Encounter (Signed)
I reordered Inspra 25mg  daily #90 through [employer] [facility]. Order # [id number] pt ID # [id number]. Pt needs to complete a new application for next reorder.

## 2010-11-03 NOTE — Telephone Encounter (Signed)
Pt left voice mail asking for nexium samples

## 2010-11-04 ENCOUNTER — Encounter: Payer: Self-pay | Admitting: Cardiology

## 2010-11-09 ENCOUNTER — Ambulatory Visit (INDEPENDENT_AMBULATORY_CARE_PROVIDER_SITE_OTHER): Payer: PRIVATE HEALTH INSURANCE | Admitting: Cardiology

## 2010-11-09 ENCOUNTER — Encounter: Payer: Self-pay | Admitting: Cardiology

## 2010-11-09 VITALS — BP 108/77 | HR 70 | Ht 67.5 in | Wt 194.8 lb

## 2010-11-09 DIAGNOSIS — J45909 Unspecified asthma, uncomplicated: Secondary | ICD-10-CM

## 2010-11-09 DIAGNOSIS — I5022 Chronic systolic (congestive) heart failure: Secondary | ICD-10-CM

## 2010-11-09 MED ORDER — ALBUTEROL SULFATE HFA 108 (90 BASE) MCG/ACT IN AERS: 2.0000 | INHALATION_SPRAY | Freq: Four times a day (QID) | RESPIRATORY_TRACT | Status: DC | PRN

## 2010-11-09 NOTE — Telephone Encounter (Signed)
Per pt call, pt was prescribed an inhaler today and pt found out the cost was $45. Pt wants to know if MD can prescribe a inhaler that falls within the $4 medication list. Please return pt call to advise/discuss.

## 2010-11-09 NOTE — Patient Instructions (Signed)
Your physician wants you to follow-up in: 6 months with Dr Earlean Shawl will receive a reminder letter in the mail two months in advance. If you don't receive a letter, please call our office to schedule the follow-up appointment.  Your physician recommends that you return for lab work today

## 2010-11-09 NOTE — Telephone Encounter (Signed)
We will try proventil to see how much it is.

## 2010-11-10 LAB — BASIC METABOLIC PANEL
BUN: 21 mg/dL (ref 6–23)
Chloride: 103 mEq/L (ref 96–112)
GFR: 52.69 mL/min — ABNORMAL LOW (ref >60.00)
Potassium: 4.7 mEq/L (ref 3.5–5.1)
Sodium: 138 mEq/L (ref 135–145)

## 2010-11-10 LAB — BRAIN NATRIURETIC PEPTIDE: Pro B Natriuretic peptide (BNP): 7 pg/mL (ref 0.0–100.0)

## 2010-11-10 NOTE — Telephone Encounter (Signed)
Pt with [facility] to access and can get med for free, just need a new rx [place] [address] [id number] [place] [id number] also needs refill of lovaza and coreg, ID needed [id number] dob 01/30/1951

## 2010-11-10 NOTE — Telephone Encounter (Signed)
Error

## 2010-11-11 DIAGNOSIS — I5022 Chronic systolic (congestive) heart failure: Secondary | ICD-10-CM | POA: Insufficient documentation

## 2010-11-11 DIAGNOSIS — J45909 Unspecified asthma, uncomplicated: Secondary | ICD-10-CM | POA: Insufficient documentation

## 2010-11-11 NOTE — Telephone Encounter (Signed)
Pt stating he needs refills on Lovaza and Coreg through [place] [facility] [phone]. I talked with [place] at [facility] pt ID  [id number]. Pt was mailed 90 day prescriptions for both Lovaza and Coreg 11/10/10. Pt will receive a re-enrollment application in the mail 40 days before his current application expires--this will be the end of October. Pt is aware that he will receive the renewal application around first-mid September. He will need to complete with income information. Pt to notify Dr Shirlee Latch when he gets renewal application so Dr Shirlee Latch can provide new prescriptions for pt for Lovaza and Coreg. I will also mail pt a pt assistance application for Proventil ([employer] [phone]) with the physician information completed. Pt is aware.

## 2010-11-11 NOTE — Progress Notes (Signed)
60 yo with history of nonischemic cardiomyopathy presents for followup.  He is doing well.  Gynecomastia resolved after stopping spironolactone.  He has been going to the gym and walks for 1-2 miles most days on the treadmill and does free weights with no exertional dyspnea.  No orthopnea.  He had an EGD recently showing erosive esophagitis and H. pylori-related gastritis.  He started Nexium as well as antibiotics and has had no further chest pain.  Repeat echo in 3/12 showed EF improved at 45-50%.  He does report occasional wheezing and had a history of asthma more prominent as a child.   Labs (12/09): SPEP negative, HIV negative, transferrin saturation 29%, BNP 24 Labs (9/10): direct LDL 54, HDL 13.8, triglycerides 1470, creatinine 0.8 Labs (05/11/09): BNP 27, WBCs 5.6 Labs (5/11): K 4.6, creatinine 1.2, TGs 238, LDL 108, HDL 34 Labs (8/11): K 4.3, creatinine 1.4, BNP 12.6 Labs (2/12): K 4.1, creatinine 1.7, LDL 102  Allergies (verified):  No Known Drug Allergies  Past Medical History: 1. Nonischemic cardiomyopathy.  The patient had a left heart catheterization done in March 2004 with normal coronaries and then in November 2008, he had a Myoview done that showed an EF of 39% with no ischemia.  Echo in April 2008 showed an EF of 35-40%, moderate diffuse hypokinesis, mild left atrial enlargement, normal RV size and function and essentially normal valves.  The cause of his cardiomyopathy has not been discovered. He has never been a heavy drinker.  He has never used cocaine and amphetamines.  HIV, SPEP, and ferritin/Fe studies were all negative in 12/09.  Echo (9/10): EF 40%, mild to moderate global HK, mild LAE.  Myoview (5/11): EF 42% with apical thinning but no evidence for ischemia or infarction.  Echo (3/12): EF 45-50%, no significant valvular abnormalities.  2. Hypertriglyceridemia 3. Rotator cuff tendonitis with a history of bilateral shoulder surgery. 4. Hypertension. 5. History of a back  surgery. 6. Knee osteoarthritis, s/p arthroscopy 2010 7. Gastroesophageal reflux disease. EGD (1/12) with erosive esophagitis and gastritis, biopsy positive for H pylori (being treated). 8. Colonoscopy (1/12) with mild diverticulosis and hemorrhoids.  9. Allergic rhinitis 10. Gynecomastia with spironolactone (ok on eplerenone).  11. Probable mild intermittent asthma  Family History: No premature CAD.  Father with prostate cancer Mother with CVA Brother with nonischemic cardiomyopathy, has ICD Grandmother with cancer (unsure what)    Social History: The patient denies smoking.  He does not use any drugs. He has never used any illicit drugs other than marijuana, which he smoked occasionally in the distant past.  He rarely drinks alcohol and was never heavy drinker.He is on disability. He is divorced with one son.     Current Outpatient Prescriptions  Medication Sig Dispense Refill  . aspirin (ASPIR-81) 81 MG EC tablet Take 81 mg by mouth daily.        . Bismuth Subsalicylate (STOMACH RELIEF PLUS) 525 MG/15ML SUSP Take by mouth 4 (four) times daily. X 14 days       . carvedilol (COREG) 12.5 MG tablet Take 12.5 mg by mouth 2 (two) times daily.        Marland Kitchen esomeprazole (NEXIUM) 40 MG capsule Take 1 capsule (40 mg total) by mouth daily.  30 capsule  0  . lisinopril (PRINIVIL,ZESTRIL) 20 MG tablet Take 20 mg by mouth daily.        . Multiple Vitamins-Minerals (MULTIVITAL) tablet Take 1 tablet by mouth daily.        . Nutritional  Supplements (CREATINE) 750 MG CAPS Take 2 capsules by mouth daily.        Marland Kitchen omega-3 acid ethyl esters (LOVAZA) 1 G capsule Take 2 g by mouth 2 (two) times daily.        Marland Kitchen thiamine 250 MG tablet Take 250 mg by mouth daily.        Marland Kitchen albuterol (PROAIR HFA) 108 (90 BASE) MCG/ACT inhaler Inhale 2 puffs into the lungs every 6 (six) hours as needed for wheezing.  1 Inhaler  6  . eplerenone (INSPRA) 25 MG tablet Take 25 mg by mouth daily.          BP 108/77  Pulse 70  Ht  5' 7.5" (1.715 m)  Wt 194 lb 12.8 oz (88.361 kg)  BMI 30.06 kg/m2 General: NAD Neck: No JVD, no thyromegaly or thyroid nodule.  Lungs: Clear to auscultation bilaterally with normal respiratory effort. CV: Nondisplaced PMI.  Heart regular S1/S2, no S3/S4, no murmur.  No peripheral edema.  No carotid bruit.  Normal pedal pulses.  Abdomen: Soft, nontender, no hepatosplenomegaly, no distention.  Neurologic: Alert and oriented x 3.  Psych: Normal affect. Extremities: No clubbing or cyanosis.

## 2010-11-11 NOTE — Assessment & Plan Note (Signed)
Nonischemic cardiomyopathy, EF improved to 45-50%.  Cause is not certain.  No heavy substance abuse, HIV negative, SPEP negative, no evidence for hemochromatosis.  This may be familial as his brother apparently also has a nonischemic cardiomyopathy and recently got an ICD.  He appears euvolemic on exam.  He seems to be NYHA class II symptomatically.  He will continue Coreg, eplerenone, and lisinopril at current doses.  BMET/BNP today to follow creatinine and K.

## 2010-11-11 NOTE — Assessment & Plan Note (Signed)
Mild intermittent asthma.  Will give him an albuterol inhaler.

## 2010-11-25 ENCOUNTER — Other Ambulatory Visit: Payer: Self-pay | Admitting: Gastroenterology

## 2010-11-28 NOTE — Telephone Encounter (Signed)
nexium samples at front desk  Pt aware

## 2010-12-09 ENCOUNTER — Telehealth: Payer: Self-pay | Admitting: Cardiology

## 2010-12-09 NOTE — Telephone Encounter (Signed)
Per pt call he has been trying to get his inhaler but when he mailed it to company it was sent back stating no such address and he faxed it but he still has not heard anything and when he calls he always gets a busy signal so he was wondering what to do

## 2010-12-13 MED ORDER — IPRATROPIUM BROMIDE HFA 17 MCG/ACT IN AERS: 2.0000 | INHALATION_SPRAY | Freq: Four times a day (QID) | RESPIRATORY_TRACT | Status: DC

## 2010-12-13 NOTE — Telephone Encounter (Signed)
I have been unable to contact [employer] about Proventil pt assisance--the phone # [phone] continues to ring busy. I reviewed with Dr Shirlee Latch. He recommended pt try atrovent inhaler 1 puff qid. I discussed with pt.

## 2010-12-14 NOTE — Telephone Encounter (Signed)
Pt unable to afford atrovent inhaler. There is a patient assistance program for Spiriva. I will review with Dr Shirlee Latch and if Sherrye Payor is OK I will complete pt assistance application for Spiriva.

## 2010-12-14 NOTE — Telephone Encounter (Signed)
Patient calling back regarding cost of medication.

## 2010-12-15 ENCOUNTER — Other Ambulatory Visit: Payer: Self-pay | Admitting: Cardiology

## 2010-12-16 ENCOUNTER — Telehealth: Payer: Self-pay | Admitting: Cardiology

## 2010-12-16 DIAGNOSIS — I10 Essential (primary) hypertension: Secondary | ICD-10-CM

## 2010-12-16 MED ORDER — LISINOPRIL 20 MG PO TABS: 20.0000 mg | ORAL_TABLET | Freq: Every day | ORAL | Status: DC

## 2010-12-16 NOTE — Telephone Encounter (Signed)
[facility] refill called to [facility].  Pt wants to know if his mdi was ordered through the pt assistance program yet?

## 2010-12-16 NOTE — Telephone Encounter (Signed)
Please clarify direction/ dosage of medication - lisinopril .

## 2010-12-16 NOTE — Telephone Encounter (Signed)
Refill sent to [facility] in [place].  N/A at pt's number.

## 2010-12-16 NOTE — Telephone Encounter (Signed)
Called [facility] and clarified dose.

## 2010-12-16 NOTE — Telephone Encounter (Signed)
Patient checking of status of inhaler. Also refill on lisinopril 20 mg. [facility] in [place]

## 2010-12-19 NOTE — Telephone Encounter (Signed)
I have reviewed with Dr Shirlee Latch. Dr Shirlee Latch is still considering an alternative inhaler for pt. I left pt a message to let him know I am still working on this.

## 2010-12-21 NOTE — Telephone Encounter (Signed)
I have reviewed with Dr Shirlee Latch. Spiriva is OK. I have completed physician section of pt assistance application for Spiriva [employer] [place] [phone] ph [phone] fax). and mailed application to  pt to complete his portion of the application. I have discussed with pt.

## 2010-12-28 ENCOUNTER — Telehealth: Payer: Self-pay | Admitting: Cardiology

## 2010-12-28 MED ORDER — CARVEDILOL 12.5 MG PO TABS: 12.5000 mg | ORAL_TABLET | Freq: Two times a day (BID) | ORAL | Status: DC

## 2010-12-28 MED ORDER — OMEGA-3-ACID ETHYL ESTERS 1 G PO CAPS: 2.0000 g | ORAL_CAPSULE | Freq: Two times a day (BID) | ORAL | Status: DC

## 2010-12-28 NOTE — Telephone Encounter (Signed)
Pt calling regarding inhaler pt was recently prescribed. Pt also needs medication RX sent to pt home address. Please return pt call to discuss further.

## 2010-12-28 NOTE — Telephone Encounter (Signed)
Pt requesting prescriptions for lovaza and coreg for pt assistance program.

## 2011-01-02 ENCOUNTER — Telehealth: Payer: Self-pay | Admitting: Gastroenterology

## 2011-01-03 MED ORDER — OMEPRAZOLE 40 MG PO CPDR: 40.0000 mg | DELAYED_RELEASE_CAPSULE | Freq: Every day | ORAL | Status: DC

## 2011-01-03 NOTE — Telephone Encounter (Signed)
Pt wants to switch to omeprazole to see if it is cheaper than Nexium Rx sent

## 2011-01-03 NOTE — Telephone Encounter (Signed)
Left message on machine to call back  

## 2011-01-12 ENCOUNTER — Telehealth: Payer: Self-pay | Admitting: *Deleted

## 2011-01-12 NOTE — Telephone Encounter (Signed)
LM for pt that Inspra 25mg  #30 x 3 bottles has come for pt. I left the medication at the front desk for pt to pick-up.

## 2011-01-17 ENCOUNTER — Telehealth: Payer: Self-pay | Admitting: Cardiology

## 2011-01-17 NOTE — Telephone Encounter (Signed)
Pt calling re inhalers, pt has question, anne tried to get it through a company, pls call, knows it will be [place]

## 2011-01-19 NOTE — Telephone Encounter (Signed)
I talked with pt. Pt states he was turned down for Spiriva through [employer]. Pt states he was mailed  a form to complete from them to get it through another company but he hasn't heard from them yet.

## 2011-02-01 ENCOUNTER — Telehealth: Payer: Self-pay | Admitting: Cardiology

## 2011-02-01 NOTE — Telephone Encounter (Signed)
Pt had a form faxed last week regarding medication changes and he has not heard anything so he was following up the form is from [employer]

## 2011-02-01 NOTE — Telephone Encounter (Signed)
I talked with pt. I have not received a fax from [employer]. Pt will ask [employer] to re-send the fax.

## 2011-02-03 ENCOUNTER — Telehealth: Payer: Self-pay | Admitting: Cardiology

## 2011-02-03 NOTE — Telephone Encounter (Signed)
Pt returning call to [facility] regarding pt paperwork being faxed over. Please return pt call to discuss further.

## 2011-02-03 NOTE — Telephone Encounter (Signed)
N/A.  LMTC. 

## 2011-02-06 NOTE — Telephone Encounter (Signed)
I reviewed with Dr Shirlee Latch. Pt should get viagra from his PCP or other healthcare provider. Pt is aware of this.

## 2011-02-06 NOTE — Telephone Encounter (Signed)
Pt calling wanting to speak with Thurston Hole regarding Dr. Shirlee Latch prescribing viagra for pt. Please return pt call to discuss further.

## 2011-04-25 ENCOUNTER — Telehealth: Payer: Self-pay | Admitting: Cardiology

## 2011-04-25 NOTE — Telephone Encounter (Signed)
Pt calling re Inspra needs to reorder, pls call when ready

## 2011-04-25 NOTE — Telephone Encounter (Signed)
Order placed for Inspra 25mg --order number [id number] to receive. Pt aware I have ordered.

## 2011-05-08 ENCOUNTER — Telehealth: Payer: Self-pay | Admitting: *Deleted

## 2011-05-08 NOTE — Telephone Encounter (Signed)
Inspra 25mg  #90 received from [employer] pt assistance. Pt aware I have left it at the desk for him to pick up.

## 2011-05-17 ENCOUNTER — Ambulatory Visit: Payer: PRIVATE HEALTH INSURANCE | Admitting: Cardiology

## 2011-06-21 ENCOUNTER — Ambulatory Visit: Payer: PRIVATE HEALTH INSURANCE | Admitting: Cardiology

## 2011-07-16 ENCOUNTER — Other Ambulatory Visit: Payer: Self-pay | Admitting: Cardiology

## 2011-07-18 ENCOUNTER — Other Ambulatory Visit: Payer: Self-pay | Admitting: *Deleted

## 2011-07-18 DIAGNOSIS — I10 Essential (primary) hypertension: Secondary | ICD-10-CM

## 2011-07-18 MED ORDER — LISINOPRIL 20 MG PO TABS: 20.0000 mg | ORAL_TABLET | Freq: Every day | ORAL | Status: DC

## 2011-07-19 ENCOUNTER — Ambulatory Visit (INDEPENDENT_AMBULATORY_CARE_PROVIDER_SITE_OTHER): Payer: Medicare Other | Admitting: Cardiology

## 2011-07-19 ENCOUNTER — Other Ambulatory Visit: Payer: Self-pay | Admitting: *Deleted

## 2011-07-19 ENCOUNTER — Encounter: Payer: Self-pay | Admitting: Cardiology

## 2011-07-19 DIAGNOSIS — I5022 Chronic systolic (congestive) heart failure: Secondary | ICD-10-CM

## 2011-07-19 DIAGNOSIS — R079 Chest pain, unspecified: Secondary | ICD-10-CM

## 2011-07-19 DIAGNOSIS — I428 Other cardiomyopathies: Secondary | ICD-10-CM

## 2011-07-19 DIAGNOSIS — J45909 Unspecified asthma, uncomplicated: Secondary | ICD-10-CM

## 2011-07-19 LAB — LIPID PANEL
Cholesterol: 218 mg/dL — ABNORMAL HIGH (ref 0–200)
Total CHOL/HDL Ratio: 7
Triglycerides: 173 mg/dL — ABNORMAL HIGH (ref 0.0–149.0)
VLDL: 34.6 mg/dL (ref 0.0–40.0)

## 2011-07-19 LAB — HEPATIC FUNCTION PANEL
AST: 41 U/L — ABNORMAL HIGH (ref 0–37)
Bilirubin, Direct: 0.1 mg/dL (ref 0.0–0.3)

## 2011-07-19 LAB — BASIC METABOLIC PANEL
Chloride: 104 mEq/L (ref 96–112)
Potassium: 4.1 mEq/L (ref 3.5–5.1)
Sodium: 141 mEq/L (ref 135–145)

## 2011-07-19 MED ORDER — FLUTICASONE-SALMETEROL 250-50 MCG/DOSE IN AEPB: 1.0000 | INHALATION_SPRAY | Freq: Two times a day (BID) | RESPIRATORY_TRACT | Status: DC

## 2011-07-19 NOTE — Patient Instructions (Signed)
Your physician wants you to follow-up in: 6 months.   You will receive a reminder letter in the mail two months in advance. If you don't receive a letter, please call our office to schedule the follow-up appointment.  Your physician recommends that you return for lab work in: today (lipid, liver, bmet) and in 3 months (bmet)  Your physician has recommended you make the following change in your medication: Start Advair 1 puff twice daily

## 2011-07-19 NOTE — Progress Notes (Signed)
61 yo with history of nonischemic cardiomyopathy presents for followup.  He is doing well.   He has been going to the [facility] and walks for 1-2 miles most days on the treadmill and does free weights with no exertional dyspnea.  No orthopnea.  No chest pain.  Repeat echo in 3/12 showed EF improved at 45-50%.  He continues to have almost daily wheezing episodes.  He has a diagnosis of asthma but is not currently using inhalers.  Albuterol helped him in the past.   Labs (12/09): SPEP negative, HIV negative, transferrin saturation 29%, BNP 24 Labs (9/10): direct LDL 54, HDL 13.8, triglycerides 1470, creatinine 0.8 Labs (05/11/09): BNP 27, WBCs 5.6 Labs (5/11): K 4.6, creatinine 1.2, TGs 238, LDL 108, HDL 34 Labs (8/11): K 4.3, creatinine 1.4, BNP 12.6 Labs (2/12): K 4.1, creatinine 1.7, LDL 102 Labs (8/12): K 4.7, creatinine 1.5  Allergies (verified):  No Known Drug Allergies  Past Medical History: 1. Nonischemic cardiomyopathy.  The patient had a left heart catheterization done in March 2004 with normal coronaries and then in November 2008, he had a Myoview done that showed an EF of 39% with no ischemia.  Echo in April 2008 showed an EF of 35-40%, moderate diffuse hypokinesis, mild left atrial enlargement, normal RV size and function and essentially normal valves.  The cause of his cardiomyopathy has not been discovered. He has never been a heavy drinker.  He has never used cocaine and amphetamines.  HIV, SPEP, and ferritin/Fe studies were all negative in 12/09.  Echo (9/10): EF 40%, mild to moderate global HK, mild LAE.  Myoview (5/11): EF 42% with apical thinning but no evidence for ischemia or infarction.  Echo (3/12): EF 45-50%, no significant valvular abnormalities.  2. Hypertriglyceridemia 3. Rotator cuff tendonitis with a history of bilateral shoulder surgery. 4. Hypertension. 5. History of a back surgery. 6. Knee osteoarthritis, s/p arthroscopy 2010 7. Gastroesophageal reflux disease. EGD (1/12)  with erosive esophagitis and gastritis, biopsy positive for H pylori (being treated). 8. Colonoscopy (1/12) with mild diverticulosis and hemorrhoids.  9. Allergic rhinitis 10. Gynecomastia with spironolactone (ok on eplerenone).  11. Probable mild intermittent asthma  Family History: No premature CAD.  Father with prostate cancer Mother with CVA Brother with nonischemic cardiomyopathy, has ICD Grandmother with cancer (unsure what)    Social History: The patient denies smoking.  He does not use any drugs. He has never used any illicit drugs other than marijuana, which he smoked occasionally in the distant past.  He rarely drinks alcohol and was never heavy drinker.He is on disability. He is divorced with one son.     ROS: All systems reviewed and negative except as per HPI.   Current Outpatient Prescriptions  Medication Sig Dispense Refill  . aspirin (ASPIR-81) 81 MG EC tablet Take 325 mg by mouth daily.       . Bismuth Subsalicylate (STOMACH RELIEF PLUS) 525 MG/15ML SUSP Take by mouth 4 (four) times daily. X 14 days       . carvedilol (COREG) 12.5 MG tablet Take 1 tablet (12.5 mg total) by mouth 2 (two) times daily.  180 tablet  3  . eplerenone (INSPRA) 25 MG tablet Take 25 mg by mouth daily.        Marland Kitchen lisinopril (PRINIVIL,ZESTRIL) 20 MG tablet Take 1 tablet (20 mg total) by mouth daily.  30 tablet  6  . omega-3 acid ethyl esters (LOVAZA) 1 G capsule Take 2 capsules (2 g total) by mouth 2 (  two) times daily.  360 capsule  3  . omeprazole (PRILOSEC) 40 MG capsule Take 1 capsule (40 mg total) by mouth daily.  30 capsule  11  . Fluticasone-Salmeterol (ADVAIR DISKUS) 250-50 MCG/DOSE AEPB Inhale 1 puff into the lungs 2 (two) times daily.  3 each  3  . DISCONTD: Fluticasone-Salmeterol (ADVAIR DISKUS) 250-50 MCG/DOSE AEPB Inhale 1 puff into the lungs 2 (two) times daily.  1 each  6    BP 118/80  Pulse 61  Ht 5\' 7"  (1.702 m)  Wt 188 lb (85.276 kg)  BMI 29.44 kg/m2 General: NAD Neck: No  JVD, no thyromegaly or thyroid nodule.  Lungs: Clear to auscultation bilaterally with normal respiratory effort. CV: Nondisplaced PMI.  Heart regular S1/S2, no S3/S4, no murmur.  No peripheral edema.  No carotid bruit.  Normal pedal pulses.  Abdomen: Soft, nontender, no hepatosplenomegaly, no distention.  Neurologic: Alert and oriented x 3.  Psych: Normal affect. Extremities: No clubbing or cyanosis.

## 2011-07-19 NOTE — Telephone Encounter (Signed)
Patient called and bridging to access will cover advair.  Will have Dr Shirlee Latch sign and fax over

## 2011-07-19 NOTE — Assessment & Plan Note (Signed)
Frequent asthma symptoms with wheezing.  I will try him on Advair 250/50 bid.

## 2011-07-19 NOTE — Assessment & Plan Note (Signed)
Nonischemic cardiomyopathy, EF improved to 45-50%.  Cause is not certain.  No heavy substance abuse, HIV negative, SPEP negative, no evidence for hemochromatosis.  This may be familial as his brother apparently also has a nonischemic cardiomyopathy and got an ICD.  He appears euvolemic on exam.  He seems to be NYHA class I symptomatically.  He will continue Coreg, eplerenone, and lisinopril at current doses.  BMET today to follow creatinine and K.  Given eplerenone use, he will need BMET again in 3 months.

## 2011-07-20 ENCOUNTER — Telehealth: Payer: Self-pay

## 2011-07-20 ENCOUNTER — Other Ambulatory Visit: Payer: Self-pay | Admitting: *Deleted

## 2011-07-20 MED ORDER — LISINOPRIL 20 MG PO TABS: 20.0000 mg | ORAL_TABLET | Freq: Two times a day (BID) | ORAL | Status: DC

## 2011-07-20 MED ORDER — LISINOPRIL 20 MG PO TABS
20.0000 mg | ORAL_TABLET | Freq: Two times a day (BID) | ORAL | Status: DC
Start: 2011-07-20 — End: 2011-07-20

## 2011-07-20 NOTE — Telephone Encounter (Signed)
Spoke with Clydie Braun at [facility] in [place]. There is a current prescription for pt for lisinopril 20mg  bid #60 ready at pharmacy. Pt aware.

## 2011-07-20 NOTE — Telephone Encounter (Signed)
Pt is calling to make sure his lisinopril gets ordered at the [facility] in [place] and that the rx gets changed to bid since he takes it bid.

## 2011-07-21 ENCOUNTER — Other Ambulatory Visit: Payer: Self-pay | Admitting: *Deleted

## 2011-07-21 DIAGNOSIS — R748 Abnormal levels of other serum enzymes: Secondary | ICD-10-CM

## 2011-07-31 ENCOUNTER — Telehealth: Payer: Self-pay | Admitting: Cardiology

## 2011-07-31 NOTE — Telephone Encounter (Signed)
[facility] reorder line [phone]

## 2011-07-31 NOTE — Telephone Encounter (Signed)
Pt calling re needing a re- order of  inspra , pls call when ready

## 2011-07-31 NOTE — Telephone Encounter (Signed)
Reordered Inspra--order # [id number]. Spoke with pt and he is aware I have ordered Inspra.

## 2011-08-09 ENCOUNTER — Telehealth: Payer: Self-pay | Admitting: Cardiology

## 2011-08-09 NOTE — Telephone Encounter (Signed)
Spoke with pt. Stock bottle Inspra 25mg  #90 left at front desk for pt to pick up.

## 2011-08-09 NOTE — Telephone Encounter (Signed)
Please return call to patient at [phone] regarding medication that was suppose to be delivered to this office!!

## 2011-10-18 ENCOUNTER — Other Ambulatory Visit (INDEPENDENT_AMBULATORY_CARE_PROVIDER_SITE_OTHER): Payer: Medicare Other

## 2011-10-18 DIAGNOSIS — J45909 Unspecified asthma, uncomplicated: Secondary | ICD-10-CM

## 2011-10-18 DIAGNOSIS — R079 Chest pain, unspecified: Secondary | ICD-10-CM

## 2011-10-18 DIAGNOSIS — I428 Other cardiomyopathies: Secondary | ICD-10-CM

## 2011-10-18 DIAGNOSIS — I5022 Chronic systolic (congestive) heart failure: Secondary | ICD-10-CM

## 2011-10-18 DIAGNOSIS — R748 Abnormal levels of other serum enzymes: Secondary | ICD-10-CM

## 2011-10-18 LAB — HEPATIC FUNCTION PANEL
AST: 36 U/L (ref 0–37)
Albumin: 3.9 g/dL (ref 3.5–5.2)
Alkaline Phosphatase: 51 U/L (ref 39–117)
Total Protein: 6.9 g/dL (ref 6.0–8.3)

## 2011-10-18 LAB — BASIC METABOLIC PANEL
Calcium: 9 mg/dL (ref 8.4–10.5)
Creatinine, Ser: 1.2 mg/dL (ref 0.4–1.5)
GFR: 65.86 mL/min (ref >60.00)

## 2011-10-20 ENCOUNTER — Other Ambulatory Visit: Payer: Self-pay | Admitting: *Deleted

## 2011-10-20 ENCOUNTER — Telehealth: Payer: Self-pay | Admitting: *Deleted

## 2011-10-20 ENCOUNTER — Other Ambulatory Visit: Payer: Medicare Other

## 2011-10-20 NOTE — Telephone Encounter (Signed)
Spoke with pt. Pt requesting an order for Inspra 25mg . I placed reorder for Inspra 25mg  1 daily with [facility] [phone]. Order #[id number].

## 2011-10-23 ENCOUNTER — Other Ambulatory Visit (INDEPENDENT_AMBULATORY_CARE_PROVIDER_SITE_OTHER): Payer: Medicare Other

## 2011-10-23 DIAGNOSIS — I428 Other cardiomyopathies: Secondary | ICD-10-CM

## 2011-10-23 LAB — HEPATITIS C ANTIBODY: HCV Ab: NEGATIVE

## 2011-10-27 ENCOUNTER — Telehealth: Payer: Self-pay | Admitting: *Deleted

## 2011-10-27 NOTE — Telephone Encounter (Signed)
Pt notified that Inspra 25mg  #30 x 3 bottles fom [employer] pt assistance  left at front desk for pick-up.

## 2011-11-03 ENCOUNTER — Telehealth: Payer: Self-pay | Admitting: Cardiology

## 2011-11-03 MED ORDER — EPLERENONE 25 MG PO TABS: 25.0000 mg | ORAL_TABLET | Freq: Every day | ORAL | Status: DC

## 2011-11-03 NOTE — Telephone Encounter (Signed)
Spoke with pt. Pt needs to renew pt assistance program for Inspra. I will complete renewal application for Dr Shirlee Latch to sign and mail to pt first of next week after it has been signed by Dr Shirlee Latch.

## 2011-11-03 NOTE — Telephone Encounter (Signed)
Pt has to redo his request for medication from [employer] and wants to know if he can do it the same way you guys did it last time please call and advise

## 2011-11-08 NOTE — Telephone Encounter (Signed)
This was signed by Dr Shirlee Latch and mailed to pt 11/06/11 per Debbie L.

## 2011-12-10 ENCOUNTER — Emergency Department (HOSPITAL_COMMUNITY)
Admission: EM | Admit: 2011-12-10 | Discharge: 2011-12-10 | Disposition: A | Discharge status: Home / Self Care | Payer: Medicare Other | Attending: Emergency Medicine | Admitting: Emergency Medicine

## 2011-12-10 ENCOUNTER — Emergency Department (HOSPITAL_COMMUNITY): Payer: Medicare Other

## 2011-12-10 ENCOUNTER — Encounter (HOSPITAL_COMMUNITY): Payer: Self-pay | Admitting: Emergency Medicine

## 2011-12-10 DIAGNOSIS — M1710 Unilateral primary osteoarthritis, unspecified knee: Secondary | ICD-10-CM | POA: Insufficient documentation

## 2011-12-10 DIAGNOSIS — W1809XA Striking against other object with subsequent fall, initial encounter: Secondary | ICD-10-CM | POA: Insufficient documentation

## 2011-12-10 DIAGNOSIS — S20219A Contusion of unspecified front wall of thorax, initial encounter: Secondary | ICD-10-CM

## 2011-12-10 DIAGNOSIS — S298XXA Other specified injuries of thorax, initial encounter: Secondary | ICD-10-CM | POA: Insufficient documentation

## 2011-12-10 DIAGNOSIS — I10 Essential (primary) hypertension: Secondary | ICD-10-CM | POA: Insufficient documentation

## 2011-12-10 DIAGNOSIS — K219 Gastro-esophageal reflux disease without esophagitis: Secondary | ICD-10-CM | POA: Insufficient documentation

## 2011-12-10 MED ORDER — MELOXICAM 7.5 MG PO TABS: ORAL_TABLET | ORAL | Status: DC

## 2011-12-10 MED ORDER — HYDROCODONE-ACETAMINOPHEN 5-325 MG PO TABS: 1.0000 | ORAL_TABLET | ORAL | Status: AC | PRN

## 2011-12-10 MED ORDER — METHOCARBAMOL 500 MG PO TABS: ORAL_TABLET | ORAL | Status: DC

## 2011-12-10 NOTE — ED Provider Notes (Signed)
History     CSN: [id number]  Arrival date & time 12/10/11  1229   First MD Initiated Contact with Patient 12/10/11 1251      Chief Complaint  Patient presents with  . Rib Injury    (Consider location/radiation/quality/duration/timing/severity/associated sxs/prior treatment) HPI Comments: Patient fell on a non-padded portion of a couch approximately 9 days ago. The patient states that he has been getting progressively worse with pain and soreness in this area since that time. He now has pain with taking a deep breath or with certain movements. The patient denies any hemoptysis. He denies any high fevers. His been no previous operations or procedures to the right chest. Initially the patient couldn't find relief from Tylenol but this is no longer helping.  The history is provided by the patient.    Past Medical History  Diagnosis Date  . Nonischemic cardiomyopathy     Myoview done that showed an EF of 39% with no ischemia. Echo in April 08 showed an ef of 35-40%, moderate diffuse hypokenis, mild left atrial enlargement, normal RV size and function and essentially normal valves. Cause of his cardiomyopathy hasn't been discovered.   . Hypertriglyceridemia   . Rotator cuff tendonitis     with hx of bilateral shoulder surgery  . HTN (hypertension)   . Knee osteoarthritis     s/p arhtroscopy 2010  . GERD (gastroesophageal reflux disease)     EGD 1/12 with erosive esophagitis and gastritis, biopsy postiive for H pylori (being  treated)  . Allergic rhinitis   . Gynecomastia     with spironolactone (ok on eplerenone)    Past Surgical History  Procedure Date  . Ptca   . Stress cardiolite 12/15/05  . Transthoracic echocardiogram 07/13/06  . Electrocardiogram 09/20/06    Family History  Problem Relation Age of Onset  . Stroke Mother   . Prostate cancer Father   . Cardiomyopathy Brother     nonischemic, has icd    History  Substance Use Topics  . Smoking status: Never Smoker   .  Smokeless tobacco: Never Used   Comment: denies   . Alcohol Use: Yes     rare      Review of Systems  Constitutional: Negative for activity change.       All ROS Neg except as noted in HPI  HENT: Negative for nosebleeds and neck pain.   Eyes: Negative for photophobia and discharge.  Respiratory: Negative for cough, shortness of breath and wheezing.   Cardiovascular: Negative for chest pain and palpitations.  Gastrointestinal: Negative for abdominal pain and blood in stool.  Genitourinary: Negative for dysuria, frequency and hematuria.  Musculoskeletal: Positive for arthralgias. Negative for back pain.  Skin: Negative.   Neurological: Negative for dizziness, seizures and speech difficulty.  Psychiatric/Behavioral: Negative for hallucinations and confusion.    Allergies  Review of patient's allergies indicates no known allergies.  Home Medications   Current Outpatient Rx  Name Route Sig Dispense Refill  . ASPIRIN 81 MG PO TBEC Oral Take 325 mg by mouth daily.     Marland Kitchen BISMUTH SUBSALICYLATE 525 MG/15ML PO SUSP Oral Take by mouth 4 (four) times daily. X 14 days     . CARVEDILOL 12.5 MG PO TABS Oral Take 1 tablet (12.5 mg total) by mouth 2 (two) times daily. 180 tablet 3  . EPLERENONE 25 MG PO TABS Oral Take 1 tablet (25 mg total) by mouth daily. 90 tablet 3  . FLUTICASONE-SALMETEROL 250-50 MCG/DOSE IN AEPB Inhalation  Inhale 1 puff into the lungs 2 (two) times daily. 3 each 3  . LISINOPRIL 20 MG PO TABS Oral Take 1 tablet (20 mg total) by mouth 2 (two) times daily. 60 tablet 11  . OMEGA-3-ACID ETHYL ESTERS 1 G PO CAPS Oral Take 2 capsules (2 g total) by mouth 2 (two) times daily. 360 capsule 3  . OMEPRAZOLE 40 MG PO CPDR Oral Take 1 capsule (40 mg total) by mouth daily. 30 capsule 11    BP 139/81  Pulse 76  Temp 98.2 F (36.8 C)  Resp 16  Ht 5\' 7"  (1.702 m)  Wt 185 lb (83.915 kg)  BMI 28.97 kg/m2  SpO2 100%  Physical Exam  Nursing note and vitals reviewed. Constitutional:  He is oriented to person, place, and time. Vital signs are normal. He appears well-developed and well-nourished.  Non-toxic appearance. He does not have a sickly appearance. No distress.    HENT:  Head: Normocephalic.  Right Ear: Tympanic membrane and external ear normal.  Left Ear: Tympanic membrane and external ear normal.  Eyes: EOM and lids are normal. Pupils are equal, round, and reactive to light.  Neck: Normal range of motion. Neck supple. Carotid bruit is not present.  Cardiovascular: Normal rate, regular rhythm, normal heart sounds, intact distal pulses and normal pulses.   Pulmonary/Chest: Breath sounds normal. No respiratory distress.       There is tenderness of the right anterior lateral area of the right chest. There's no deformity or bruise appreciated at this time. No crepitus.  Abdominal: Soft. Bowel sounds are normal. There is no tenderness. There is no guarding.  Musculoskeletal: Normal range of motion.  Lymphadenopathy:       Head (right side): No submandibular adenopathy present.       Head (left side): No submandibular adenopathy present.    He has no cervical adenopathy.  Neurological: He is alert and oriented to person, place, and time. He has normal strength. No cranial nerve deficit or sensory deficit.  Skin: Skin is warm and dry.  Psychiatric: He has a normal mood and affect. His speech is normal.    ED Course  Procedures (including critical care time)  Labs Reviewed - No data to display Dg Ribs Unilateral W/chest Right  12/10/2011  *RADIOLOGY REPORT*  Clinical Data: Pain post fall.  RIGHT RIBS AND CHEST - 3+ VIEW  Comparison: 11/24/2009  Findings: No pneumothorax or effusion.  Lungs clear.  Heart size normal.  No displaced fracture or other focal lesion.  IMPRESSION:  1.  Negative   Original Report Authenticated By: Osa Craver, M.D.      No diagnosis found.    MDM  I have reviewed nursing notes, vital signs, and all appropriate lab and  imaging results for this patient. The right rib and chest x-ray is negative for fracture or dislocation. The pulse oximetry is 100% on room air. There is no gross deformity of the ribs on the right. The plan at this time is for the patient to receive Mobic 7.5 mg twice a day, Robaxin 3 times daily for spasm, and Norco every 4 hours if needed for pain. #15       Kathie Dike, PA 12/10/11 1344

## 2011-12-10 NOTE — ED Notes (Signed)
Pt c/o right rib pain after falling x 9 days ago.

## 2011-12-11 NOTE — ED Provider Notes (Signed)
Medical screening examination/treatment/procedure(s) were performed by non-physician practitioner and as supervising physician I was immediately available for consultation/collaboration.  Shelda Jakes, MD 12/11/11 [phone]

## 2011-12-26 ENCOUNTER — Telehealth: Payer: Self-pay | Admitting: Cardiology

## 2011-12-26 NOTE — Telephone Encounter (Signed)
Pt needs to fill out a new application for bridges to access for some of his meds, says anne usually fills out our part and mails to pt to fill out his part

## 2011-12-28 MED ORDER — OMEGA-3-ACID ETHYL ESTERS 1 G PO CAPS: 2.0000 g | ORAL_CAPSULE | Freq: Two times a day (BID) | ORAL | Status: DC

## 2011-12-28 MED ORDER — CARVEDILOL 12.5 MG PO TABS: 12.5000 mg | ORAL_TABLET | Freq: Two times a day (BID) | ORAL | Status: DC

## 2011-12-28 MED ORDER — FLUTICASONE-SALMETEROL 250-50 MCG/DOSE IN AEPB: 1.0000 | INHALATION_SPRAY | Freq: Two times a day (BID) | RESPIRATORY_TRACT | Status: DC

## 2011-12-28 NOTE — Telephone Encounter (Signed)
Spoke with pt. Pt states he needs to renew his [employer] to Access application to get advair, lovaza, and coreg. I have provided prescriptions for him to pick up at front desk for these medications to include in his [employer] to Access renewal application.

## 2012-01-18 ENCOUNTER — Telehealth: Payer: Self-pay | Admitting: Gastroenterology

## 2012-01-18 NOTE — Telephone Encounter (Signed)
Left message on machine to call back  

## 2012-01-18 NOTE — Telephone Encounter (Signed)
Pt has a soreness below his belly button x 3 weeks pt advised to call his PCP to be evaluated he exercises frequently and may have a pulled muscle he will have the PCP call if they think he needs a GI eval

## 2012-01-24 ENCOUNTER — Encounter: Payer: Self-pay | Admitting: Cardiology

## 2012-01-24 ENCOUNTER — Ambulatory Visit (INDEPENDENT_AMBULATORY_CARE_PROVIDER_SITE_OTHER): Payer: Medicare Other | Admitting: Cardiology

## 2012-01-24 VITALS — BP 128/78 | HR 71 | Resp 12 | Ht 67.5 in | Wt 194.0 lb

## 2012-01-24 DIAGNOSIS — R7989 Other specified abnormal findings of blood chemistry: Secondary | ICD-10-CM

## 2012-01-24 DIAGNOSIS — J45909 Unspecified asthma, uncomplicated: Secondary | ICD-10-CM

## 2012-01-24 DIAGNOSIS — I5022 Chronic systolic (congestive) heart failure: Secondary | ICD-10-CM

## 2012-01-24 LAB — BASIC METABOLIC PANEL
Calcium: 9.4 mg/dL (ref 8.4–10.5)
Creatinine, Ser: 1.1 mg/dL (ref 0.4–1.5)
GFR: 70.53 mL/min (ref >60.00)

## 2012-01-24 NOTE — Progress Notes (Signed)
Patient ID: [name] [name], male   DOB: 05/07/1950, 61 y.o.   MRN: [id number] 61 yo with history of nonischemic cardiomyopathy presents for followup.  He is doing well.   He has been going to the [facility] and walks for 1-2 miles most days on the treadmill and does free weights with no exertional dyspnea.  No orthopnea.  No chest pain.  Repeat echo in 3/12 showed EF improved at 45-50%.  He still wheezes occasionally but Advair helps.  I have noted that his transaminases have been mildly elevated.  From his report, he is not a heavy daily ETOH drinker but does take 5-6 shots about once a week when he goes out during the weekend.  On other days, he does not drink.  He has been gaining weight, probably due to poor diet.  He has been trying to adjust this and says he is no longer eating chicken wings.    Labs (12/09): SPEP negative, HIV negative, transferrin saturation 29%, BNP 24 Labs (9/10): direct LDL 54, HDL 13.8, triglycerides 1470, creatinine 0.8 Labs (05/11/09): BNP 27, WBCs 5.6 Labs (5/11): K 4.6, creatinine 1.2, TGs 238, LDL 108, HDL 34 Labs (8/11): K 4.3, creatinine 1.4, BNP 12.6 Labs (2/12): K 4.1, creatinine 1.7, LDL 102 Labs (8/12): K 4.7, creatinine 1.5 Labs (4/13): LDL 132, HDL 33.4, AST 41, ALT 63 Labs (7/13): K 4, creatinine 1.2, ALT 59, AST 36, HCV negative  Allergies (verified):  No Known Drug Allergies  Past Medical History: 1. Nonischemic cardiomyopathy.  The patient had a left heart catheterization done in March 2004 with normal coronaries and then in November 2008, he had a Myoview done that showed an EF of 39% with no ischemia.  Echo in April 2008 showed an EF of 35-40%, moderate diffuse hypokinesis, mild left atrial enlargement, normal RV size and function and essentially normal valves.  The cause of his cardiomyopathy has not been discovered. He has never been a heavy drinker.  He has never used cocaine and amphetamines.  HIV, SPEP, and ferritin/Fe studies were all negative in 12/09.   Echo (9/10): EF 40%, mild to moderate global HK, mild LAE.  Myoview (5/11): EF 42% with apical thinning but no evidence for ischemia or infarction.  Echo (3/12): EF 45-50%, no significant valvular abnormalities.  2. Hypertriglyceridemia 3. Rotator cuff tendonitis with a history of bilateral shoulder surgery. 4. Hypertension. 5. History of a back surgery. 6. Knee osteoarthritis, s/p arthroscopy 2010 7. Gastroesophageal reflux disease. EGD (1/12) with erosive esophagitis and gastritis, biopsy positive for H pylori (being treated). 8. Colonoscopy (1/12) with mild diverticulosis and hemorrhoids.  9. Allergic rhinitis 10. Gynecomastia with spironolactone (ok on eplerenone).  11. Probable mild intermittent asthma 12. Elevated LFTs: HCV negative, possibly due to ETOH  Family History: No premature CAD.  Father with prostate cancer Mother with CVA Brother with nonischemic cardiomyopathy, has ICD Grandmother with cancer (unsure what)    Social History: The patient denies smoking.  He does not use any drugs. He has never used any illicit drugs other than marijuana, which he smoked occasionally in the distant past.  Not a regular ETOH drinker but does binge drink on occasion.He is on disability. He is divorced with two sons.      Current Outpatient Prescriptions  Medication Sig Dispense Refill  . aspirin 325 MG tablet Take 325 mg by mouth daily.      . carvedilol (COREG) 12.5 MG tablet Take 1 tablet (12.5 mg total) by mouth 2 (two) times  daily.  180 tablet  3  . eplerenone (INSPRA) 25 MG tablet Take 1 tablet (25 mg total) by mouth daily.  90 tablet  3  . Fluticasone-Salmeterol (ADVAIR DISKUS) 250-50 MCG/DOSE AEPB Inhale 1 puff into the lungs 2 (two) times daily.  3 each  3  . GINSENG PO Take 1 tablet by mouth 2 (two) times daily.      Marland Kitchen lisinopril (PRINIVIL,ZESTRIL) 20 MG tablet Take 1 tablet (20 mg total) by mouth 2 (two) times daily.  60 tablet  11  . omega-3 acid ethyl esters (LOVAZA) 1 G  capsule Take 2 capsules (2 g total) by mouth 2 (two) times daily.  360 capsule  3  . omeprazole (PRILOSEC) 40 MG capsule Take 40 mg by mouth daily.      Marland Kitchen DISCONTD: omeprazole (PRILOSEC) 40 MG capsule Take 1 capsule (40 mg total) by mouth daily.  30 capsule  11    BP 128/78  Pulse 71  Resp 12  Ht 5' 7.5" (1.715 m)  Wt 194 lb (87.998 kg)  BMI 29.94 kg/m2  SpO2 98% General: NAD Neck: No JVD, no thyromegaly or thyroid nodule.  Lungs: Clear to auscultation bilaterally with normal respiratory effort. CV: Nondisplaced PMI.  Heart regular S1/S2, no S3/S4, no murmur.  No peripheral edema.  No carotid bruit.  Normal pedal pulses.  Abdomen: Soft, nontender, no hepatosplenomegaly, no distention.  Neurologic: Alert and oriented x 3.  Psych: Normal affect. Extremities: No clubbing or cyanosis.   Assessment/Plan:  Chronic systolic heart failure  Nonischemic cardiomyopathy, EF improved to 45-50%. Cause is not certain. No heavy substance abuse (though occasionally binge drinks), HIV negative, SPEP negative, no evidence for hemochromatosis. This may be familial as his brother apparently also has a nonischemic cardiomyopathy and got an ICD. He appears euvolemic on exam. He seems to be NYHA class I symptomatically. He will continue Coreg, eplerenone, and lisinopril at current doses. BMET today to follow creatinine and K. Given eplerenone use, he will need BMET again in 3 months.  Asthma  Continue Advair, seems to have helped his wheezing some.  Hyperlipidemia He needs to make dietary changes to get his LDL down.  I will not start statin at this time but will closely monitor.  Elevated LFTs HCV negative.  May be due to ETOH.  I asked him today to try to limit binge ETOH intake for both his heart and his liver.  He does not drink regularly.   Marca Ancona 01/24/2012 9:19 AM

## 2012-01-24 NOTE — Patient Instructions (Addendum)
Decrease aspirin to 81mg  daily.  You should take this with food.   Your physician recommends that you have  lab work today--BMET.  Your physician recommends that you return for lab work in: 3 months --BMET.  Your physician wants you to follow-up in: 6 months with Dr Shirlee Latch. (April 2014). You will receive a reminder letter in the mail two months in advance. If you don't receive a letter, please call our office to schedule the follow-up appointment.

## 2012-04-25 ENCOUNTER — Other Ambulatory Visit (INDEPENDENT_AMBULATORY_CARE_PROVIDER_SITE_OTHER): Payer: Medicare Other

## 2012-04-25 DIAGNOSIS — I5022 Chronic systolic (congestive) heart failure: Secondary | ICD-10-CM

## 2012-04-25 LAB — BASIC METABOLIC PANEL
BUN: 17 mg/dL (ref 6–23)
GFR: 51.21 mL/min — ABNORMAL LOW (ref >60.00)
Potassium: 4.1 mEq/L (ref 3.5–5.1)
Sodium: 136 mEq/L (ref 135–145)

## 2012-05-14 ENCOUNTER — Telehealth: Payer: Self-pay | Admitting: Cardiology

## 2012-05-14 NOTE — Telephone Encounter (Signed)
[name] [name] was order instra 25 mg for pt and he was calling to see if it was in yet

## 2012-05-14 NOTE — Telephone Encounter (Signed)
Will forward to Katina Dung, Charity fundraiser.  Pt looking for meds ordered from [facility]-Inspra 25mg .

## 2012-05-15 ENCOUNTER — Telehealth: Payer: Self-pay | Admitting: Cardiology

## 2012-05-15 NOTE — Telephone Encounter (Signed)
Pt's ID # [id number]. She gave me [phone] to reorder Inspra.

## 2012-05-15 NOTE — Telephone Encounter (Signed)
Spoke with Jasmine at [facility] [phone]. She states pt's current application for Inspra expired in November 2013.

## 2012-05-15 NOTE — Telephone Encounter (Signed)
Pt calling re med to be ordered for him, inspra, called company and order not there, ran out yesterday, wants to talk with anne

## 2012-05-15 NOTE — Telephone Encounter (Signed)
I reordered Inspra 25mg  daily 90 days order #[id number]. Order should be here in 7-10 days. LMTCB for pt.

## 2012-05-15 NOTE — Telephone Encounter (Signed)
See phone note dated 05/14/12

## 2012-05-15 NOTE — Telephone Encounter (Signed)
See phone note dated 05/14/12. Order # [id number]

## 2012-05-16 NOTE — Telephone Encounter (Signed)
LM on voice ID voice mail that I have ordered Inspra.

## 2012-05-28 ENCOUNTER — Telehealth: Payer: Self-pay | Admitting: *Deleted

## 2012-05-28 NOTE — Telephone Encounter (Signed)
Pt aware Inspra is at front desk for pick up

## 2012-05-28 NOTE — Telephone Encounter (Signed)
Inspra 25mg  3 bottles of #30 tablets each received from [facility].

## 2012-08-05 ENCOUNTER — Telehealth: Payer: Self-pay | Admitting: Cardiology

## 2012-08-05 MED ORDER — LISINOPRIL 20 MG PO TABS: 20.0000 mg | ORAL_TABLET | Freq: Two times a day (BID) | ORAL | Status: DC

## 2012-08-05 NOTE — Telephone Encounter (Signed)
Pt requesting a refill of lisinopril 20mg  bid.

## 2012-08-05 NOTE — Telephone Encounter (Signed)
New problem   Pt have questions about some of his medications he is currently taking. Please call pt

## 2012-08-26 ENCOUNTER — Telehealth: Payer: Self-pay | Admitting: Cardiology

## 2012-08-26 NOTE — Telephone Encounter (Signed)
New Problem:    Patient called in needing a refill of his Inspra.  Please call back.

## 2012-08-26 NOTE — Telephone Encounter (Signed)
Order # [id number]. Spoke with Helmut Muster at [phone]. Pt ID [id number]. LM for pt that I have placed order.

## 2012-08-26 NOTE — Telephone Encounter (Signed)
Reorder for Inspra 25mg  daily #90 tablets placed.

## 2012-09-05 ENCOUNTER — Telehealth: Payer: Self-pay | Admitting: *Deleted

## 2012-09-05 NOTE — Telephone Encounter (Signed)
Stock bottle of Inspra 25mg  #90 left at front desk for pt pick up. LM on voice ID mail med at front desk for pick up.

## 2012-10-23 ENCOUNTER — Telehealth: Payer: Self-pay | Admitting: Cardiology

## 2012-10-23 MED ORDER — EPLERENONE 25 MG PO TABS: 25.0000 mg | ORAL_TABLET | Freq: Every day | ORAL | Status: DC

## 2012-10-23 MED ORDER — CARVEDILOL 12.5 MG PO TABS: 12.5000 mg | ORAL_TABLET | Freq: Two times a day (BID) | ORAL | Status: DC

## 2012-10-23 MED ORDER — OMEGA-3-ACID ETHYL ESTERS 1 G PO CAPS: 2.0000 g | ORAL_CAPSULE | Freq: Two times a day (BID) | ORAL | Status: DC

## 2012-10-23 MED ORDER — FLUTICASONE-SALMETEROL 250-50 MCG/DOSE IN AEPB: 1.0000 | INHALATION_SPRAY | Freq: Two times a day (BID) | RESPIRATORY_TRACT | Status: DC

## 2012-10-23 NOTE — Telephone Encounter (Signed)
Spoke with patient. Pt requesting application for [employer] to Access (coreg/lovaza/advair) and  [employer] to Care (Inspra) be completed by Dr Shirlee Latch.

## 2012-10-23 NOTE — Telephone Encounter (Signed)
The physician portion of these applications have been completed along with new prescriptions. At the patient's request they have been left at the front desk for pt to pick up.

## 2012-10-23 NOTE — Telephone Encounter (Signed)
New Prob      Pt states he needs a form filled out for his medications. Please call.

## 2012-11-06 ENCOUNTER — Ambulatory Visit: Payer: Medicare Other | Admitting: Cardiology

## 2012-11-11 ENCOUNTER — Telehealth: Payer: Self-pay | Admitting: *Deleted

## 2012-11-11 NOTE — Telephone Encounter (Signed)
Inspra 25mg  3 bottles of 30 tablets each  received from [employer] patient assistance program. Pt notified medication will be at front desk for pick up.

## 2013-01-08 ENCOUNTER — Ambulatory Visit (INDEPENDENT_AMBULATORY_CARE_PROVIDER_SITE_OTHER): Payer: Medicare Other | Admitting: Cardiology

## 2013-01-08 ENCOUNTER — Encounter: Payer: Self-pay | Admitting: Cardiology

## 2013-01-08 VITALS — BP 119/73 | HR 54 | Ht 67.0 in | Wt 181.0 lb

## 2013-01-08 DIAGNOSIS — I428 Other cardiomyopathies: Secondary | ICD-10-CM

## 2013-01-08 DIAGNOSIS — E785 Hyperlipidemia, unspecified: Secondary | ICD-10-CM | POA: Insufficient documentation

## 2013-01-08 DIAGNOSIS — I5022 Chronic systolic (congestive) heart failure: Secondary | ICD-10-CM

## 2013-01-08 LAB — CBC WITH DIFFERENTIAL/PLATELET
Basophils Absolute: 0 10*3/uL (ref 0.0–0.1)
Eosinophils Relative: 4 % (ref 0.0–5.0)
HCT: 39.9 % (ref 39.0–52.0)
Hemoglobin: 13.7 g/dL (ref 13.0–17.0)
Lymphocytes Relative: 39.9 % (ref 12.0–46.0)
Lymphs Abs: 2 10*3/uL (ref 0.7–4.0)
Monocytes Absolute: 0.6 10*3/uL (ref 0.1–1.0)
Monocytes Relative: 11.4 % (ref 3.0–12.0)
Neutrophils Relative %: 43.9 % (ref 43.0–77.0)
Platelets: 143 10*3/uL — ABNORMAL LOW (ref 150.0–400.0)
RDW: 13 % (ref 11.5–14.6)
WBC: 5 10*3/uL (ref 4.5–10.5)

## 2013-01-08 LAB — BASIC METABOLIC PANEL
BUN: 11 mg/dL (ref 6–23)
CO2: 26 mEq/L (ref 19–32)
Calcium: 9 mg/dL (ref 8.4–10.5)
GFR: 83.84 mL/min (ref >60.00)
Potassium: 4.1 mEq/L (ref 3.5–5.1)
Sodium: 137 mEq/L (ref 135–145)

## 2013-01-08 LAB — LIPID PANEL
Cholesterol: 210 mg/dL — ABNORMAL HIGH (ref 0–200)
HDL: 35.4 mg/dL — ABNORMAL LOW (ref >39.00)
Total CHOL/HDL Ratio: 6
Triglycerides: 117 mg/dL (ref 0.0–149.0)

## 2013-01-08 NOTE — Progress Notes (Signed)
Patient ID: [name] [name], male   DOB: [birth year], 62 y.o.   MRN: [id number] 62 yo with history of nonischemic cardiomyopathy presents for followup.  He is doing well.   He has been going to the [facility] and walks for 1-2 miles most days on the treadmill and does free weights with no exertional dyspnea.  He can walk up to his 3rd floor apartment without trouble.  No orthopnea.  No chest pain.  Repeat echo in 3/12 showed EF improved at 45-50%.  He still wheezes occasionally but Advair helps. Weight is down 13 lbs since last visit (more exercise).   Labs (12/09): SPEP negative, HIV negative, transferrin saturation 29%, BNP 24 Labs (9/10): direct LDL 54, HDL 13.8, triglycerides 1470, creatinine 0.8 Labs (05/11/09): BNP 27, WBCs 5.6 Labs (5/11): K 4.6, creatinine 1.2, TGs 238, LDL 108, HDL 34 Labs (8/11): K 4.3, creatinine 1.4, BNP 12.6 Labs (2/12): K 4.1, creatinine 1.7, LDL 102 Labs (8/12): K 4.7, creatinine 1.5 Labs (4/13): LDL 132, HDL 33.4, AST 41, ALT 63 Labs (7/13): K 4, creatinine 1.2, ALT 59, AST 36, HCV negative Labs (1/14): K 4.1, creatinine 1.5  ECG; NSR, 1st degree AV block  Allergies (verified):  No Known Drug Allergies  Past Medical History: 1. Nonischemic cardiomyopathy.  The patient had a left heart catheterization done in March 2004 with normal coronaries and then in November 2008, he had a Myoview done that showed an EF of 39% with no ischemia.  Echo in April 2008 showed an EF of 35-40%, moderate diffuse hypokinesis, mild left atrial enlargement, normal RV size and function and essentially normal valves.  The cause of his cardiomyopathy has not been discovered. He has never been a heavy drinker.  He has never used cocaine and amphetamines.  HIV, SPEP, and ferritin/Fe studies were all negative in 12/09.  Echo (9/10): EF 40%, mild to moderate global HK, mild LAE.  Myoview (5/11): EF 42% with apical thinning but no evidence for ischemia or infarction.  Echo (3/12): EF 45-50%, no significant  valvular abnormalities.  2. Hypertriglyceridemia 3. Rotator cuff tendonitis with a history of bilateral shoulder surgery. 4. Hypertension. 5. History of a back surgery. 6. Knee osteoarthritis, s/p arthroscopy 2010 7. Gastroesophageal reflux disease. EGD (1/12) with erosive esophagitis and gastritis, biopsy positive for H pylori (being treated). 8. Colonoscopy (1/12) with mild diverticulosis and hemorrhoids.  9. Allergic rhinitis 10. Gynecomastia with spironolactone (ok on eplerenone).  11. Probable mild intermittent asthma 12. Elevated LFTs: HCV negative, possibly due to ETOH 13. CKD  Family History: No premature CAD.  Father with prostate cancer Mother with CVA Brother with nonischemic cardiomyopathy, has ICD Grandmother with cancer (unsure what)    Social History: The patient denies smoking.  He does not use any drugs. He has never used any illicit drugs other than marijuana, which he smoked occasionally in the distant past.  Not a regular ETOH drinker but does binge drink on occasion.He is on disability. He is divorced with two sons.     ROS:  All systems reviewed and negative except as per HPI.    Current Outpatient Prescriptions  Medication Sig Dispense Refill  . aspirin EC 81 MG tablet Take 1 tablet (81 mg total) by mouth daily.      . carvedilol (COREG) 12.5 MG tablet Take 1 tablet (12.5 mg total) by mouth 2 (two) times daily.  180 tablet  3  . eplerenone (INSPRA) 25 MG tablet Take 1 tablet (25 mg total) by mouth daily.  90 tablet  3  . Fluticasone-Salmeterol (ADVAIR DISKUS) 250-50 MCG/DOSE AEPB Inhale 1 puff into the lungs 2 (two) times daily.  3 each  3  . GINSENG PO Take 1 tablet by mouth 2 (two) times daily.      Marland Kitchen lisinopril (PRINIVIL,ZESTRIL) 20 MG tablet Take 1 tablet (20 mg total) by mouth 2 (two) times daily.  180 tablet  3  . omega-3 acid ethyl esters (LOVAZA) 1 G capsule Take 2 capsules (2 g total) by mouth 2 (two) times daily.  360 capsule  3  . omeprazole  (PRILOSEC) 40 MG capsule Take 40 mg by mouth daily.       No current facility-administered medications for this visit.    BP 119/73  Pulse 54  Ht 5\' 7"  (1.702 m)  Wt 82.101 kg (181 lb)  BMI 28.34 kg/m2 General: NAD Neck: No JVD, no thyromegaly or thyroid nodule.  Lungs: Clear to auscultation bilaterally with normal respiratory effort. CV: Nondisplaced PMI.  Heart regular S1/S2, no S3/S4, no murmur.  No peripheral edema.  No carotid bruit.  Normal pedal pulses.  Abdomen: Soft, nontender, no hepatosplenomegaly, no distention.  Neurologic: Alert and oriented x 3.  Psych: Normal affect. Extremities: No clubbing or cyanosis.   Assessment/Plan:  Chronic systolic heart failure  Nonischemic cardiomyopathy, EF 45-50% on 2012 echo. Cause is not certain. No heavy substance abuse (though occasionally binge drinks), HIV negative, SPEP negative, no evidence for hemochromatosis. This may be familial as his brother apparently also has a nonischemic cardiomyopathy and got an ICD. He appears euvolemic on exam. He seems to be NYHA class I symptomatically. He will continue Coreg, eplerenone, and lisinopril at current doses. BMET today to follow creatinine and K. Given eplerenone use, he will need BMET again in 3 months. I am going to repeat an echo to make sure that his EF remains improved.  If low, he would be a candidate for ICD.  Hyperlipidemia Check lipids today.    Marca Ancona 01/08/2013 10:52 PM

## 2013-01-08 NOTE — Patient Instructions (Signed)
Your physician recommends that have lab work today--Lipid profile/CBCd/BMET.  Your physician has requested that you have an echocardiogram. Echocardiography is a painless test that uses sound waves to create images of your heart. It provides your doctor with information about the size and shape of your heart and how well your heart's chambers and valves are working. This procedure takes approximately one hour. There are no restrictions for this procedure.  Your physician wants you to follow-up in: 6 months with Dr Shirlee Latch. (April 2015).  You will receive a reminder letter in the mail two months in advance. If you don't receive a letter, please call our office to schedule the follow-up appointment.

## 2013-01-09 ENCOUNTER — Other Ambulatory Visit: Payer: Self-pay | Admitting: *Deleted

## 2013-01-09 DIAGNOSIS — I5022 Chronic systolic (congestive) heart failure: Secondary | ICD-10-CM

## 2013-01-09 MED ORDER — ATORVASTATIN CALCIUM 20 MG PO TABS: 20.0000 mg | ORAL_TABLET | Freq: Every day | ORAL | Status: DC

## 2013-01-28 ENCOUNTER — Ambulatory Visit (HOSPITAL_COMMUNITY): Payer: Medicare Other | Attending: Cardiology | Admitting: Cardiology

## 2013-01-28 DIAGNOSIS — I10 Essential (primary) hypertension: Secondary | ICD-10-CM | POA: Insufficient documentation

## 2013-01-28 DIAGNOSIS — I080 Rheumatic disorders of both mitral and aortic valves: Secondary | ICD-10-CM | POA: Insufficient documentation

## 2013-01-28 DIAGNOSIS — I509 Heart failure, unspecified: Secondary | ICD-10-CM | POA: Insufficient documentation

## 2013-01-28 DIAGNOSIS — I379 Nonrheumatic pulmonary valve disorder, unspecified: Secondary | ICD-10-CM | POA: Insufficient documentation

## 2013-01-28 DIAGNOSIS — I5022 Chronic systolic (congestive) heart failure: Secondary | ICD-10-CM

## 2013-01-28 DIAGNOSIS — I079 Rheumatic tricuspid valve disease, unspecified: Secondary | ICD-10-CM | POA: Insufficient documentation

## 2013-01-28 DIAGNOSIS — I428 Other cardiomyopathies: Secondary | ICD-10-CM | POA: Insufficient documentation

## 2013-01-28 DIAGNOSIS — E785 Hyperlipidemia, unspecified: Secondary | ICD-10-CM | POA: Insufficient documentation

## 2013-01-28 NOTE — Progress Notes (Signed)
Echo performed. 

## 2013-02-04 ENCOUNTER — Telehealth: Payer: Self-pay | Admitting: Gastroenterology

## 2013-02-04 MED ORDER — OMEPRAZOLE 40 MG PO CPDR: 40.0000 mg | DELAYED_RELEASE_CAPSULE | Freq: Every day | ORAL | Status: DC

## 2013-02-04 NOTE — Telephone Encounter (Signed)
Pt aware that he must keep office appt for further refills

## 2013-03-05 ENCOUNTER — Telehealth: Payer: Self-pay | Admitting: Gastroenterology

## 2013-03-05 ENCOUNTER — Telehealth: Payer: Self-pay | Admitting: Cardiology

## 2013-03-05 MED ORDER — OMEPRAZOLE 40 MG PO CPDR: 40.0000 mg | DELAYED_RELEASE_CAPSULE | Freq: Every day | ORAL | Status: DC

## 2013-03-05 NOTE — Telephone Encounter (Signed)
Called order in to [employer] patient assistance program for patients inspra. Order number [id number].

## 2013-03-05 NOTE — Telephone Encounter (Signed)
New Problem     Pt need his Med INSPRA 25 mg,ordered he has 4 left

## 2013-03-05 NOTE — Telephone Encounter (Signed)
Pt aware he has 1 month and must keep appt for further refills

## 2013-03-10 ENCOUNTER — Telehealth: Payer: Self-pay | Admitting: Gastroenterology

## 2013-03-10 ENCOUNTER — Ambulatory Visit: Payer: Medicare Other | Admitting: Gastroenterology

## 2013-03-10 NOTE — Telephone Encounter (Signed)
Pt will not be able to get refills until seen in the office.  Bill pt

## 2013-03-12 ENCOUNTER — Other Ambulatory Visit: Payer: Self-pay | Admitting: *Deleted

## 2013-03-12 NOTE — Telephone Encounter (Signed)
Called patient to inform him that his inspra from the patient assistance program had came in and that I would put it at the front desk for pick up.

## 2013-03-13 ENCOUNTER — Other Ambulatory Visit: Payer: Medicare Other

## 2013-03-14 ENCOUNTER — Other Ambulatory Visit: Payer: Medicare Other

## 2013-04-07 ENCOUNTER — Ambulatory Visit (INDEPENDENT_AMBULATORY_CARE_PROVIDER_SITE_OTHER): Payer: Medicare Other | Admitting: Gastroenterology

## 2013-04-07 ENCOUNTER — Encounter: Payer: Self-pay | Admitting: Gastroenterology

## 2013-04-07 VITALS — BP 110/70 | HR 68 | Ht 67.5 in | Wt 177.2 lb

## 2013-04-07 DIAGNOSIS — K219 Gastro-esophageal reflux disease without esophagitis: Secondary | ICD-10-CM

## 2013-04-07 MED ORDER — OMEPRAZOLE 40 MG PO CPDR: 40.0000 mg | DELAYED_RELEASE_CAPSULE | Freq: Every day | ORAL | Status: DC

## 2013-04-07 NOTE — Progress Notes (Signed)
Review of pertinent gastrointestinal problems: 1. Routine risk for colon cancer: colonoscopy 04/2010 Dr. Christella Hartigan done for minor rectal bleeding; small hemorrhoids, left sided diverticulosis, no polyps.  Recall colon cancer screening recommended at 10 years 2. H pylori + gastritis; EGD Christella Hartigan 04/2010 done for dyspepsia; treated with abx 3. Erosive esophagitis, (ERGD 2012). 4. Small to medium sized HH.   HPI: This is a   very pleasant 62 year old man whom I last saw about 2-3 years ago.  Feels good as long as he takes ppi daily.  Feels pyrosis.   No dysphagia.   Weight loss 30 pounds, had a lot of trouble with girlfriend.  Feels overall much better.  This was over 4 months.   He has been exercising more, eating differently.  No overt bleeding.  Feeling eelctrical shock like sensation in left chest.  Planning to see his cardiologist about it.    Past Medical History  Diagnosis Date  . Nonischemic cardiomyopathy     Myoview done that showed an EF of 39% with no ischemia. Echo in April 08 showed an ef of 35-40%, moderate diffuse hypokenis, mild left atrial enlargement, normal RV size and function and essentially normal valves. Cause of his cardiomyopathy hasn't been discovered.   . Hypertriglyceridemia   . Rotator cuff tendonitis     with hx of bilateral shoulder surgery  . HTN (hypertension)   . Knee osteoarthritis     s/p arhtroscopy 2010  . GERD (gastroesophageal reflux disease)     EGD 1/12 with erosive esophagitis and gastritis, biopsy postiive for H pylori (being  treated)  . Allergic rhinitis   . Gynecomastia     with spironolactone (ok on eplerenone)    Past Surgical History  Procedure Laterality Date  . Ptca    . Stress cardiolite  12/15/05  . Transthoracic echocardiogram  07/13/06  . Electrocardiogram  09/20/06    Current Outpatient Prescriptions  Medication Sig Dispense Refill  . aspirin 325 MG tablet Take 325 mg by mouth daily.      . carvedilol (COREG) 12.5 MG tablet  Take 1 tablet (12.5 mg total) by mouth 2 (two) times daily.  180 tablet  3  . eplerenone (INSPRA) 25 MG tablet Take 1 tablet (25 mg total) by mouth daily.  90 tablet  3  . Fluticasone-Salmeterol (ADVAIR DISKUS) 250-50 MCG/DOSE AEPB Inhale 1 puff into the lungs 2 (two) times daily.  3 each  3  . GINSENG PO Take 1 tablet by mouth 2 (two) times daily.      Marland Kitchen lisinopril (PRINIVIL,ZESTRIL) 20 MG tablet Take 1 tablet (20 mg total) by mouth 2 (two) times daily.  180 tablet  3  . omega-3 acid ethyl esters (LOVAZA) 1 G capsule Take 2 capsules (2 g total) by mouth 2 (two) times daily.  360 capsule  3  . omeprazole (PRILOSEC) 40 MG capsule Take 1 capsule (40 mg total) by mouth daily.  30 capsule  0   No current facility-administered medications for this visit.    Allergies as of 04/07/2013  . (No Known Allergies)    Family History  Problem Relation Age of Onset  . Stroke Mother   . Prostate cancer Father   . Cardiomyopathy Brother     nonischemic, has icd    History   Social History  . Marital Status: Divorced    Spouse Name: N/A    Number of Children: N/A  . Years of Education: N/A   Occupational History  . Not  on file.   Social History Main Topics  . Smoking status: Never Smoker   . Smokeless tobacco: Never Used     Comment: denies   . Alcohol Use: Yes     Comment: rare  . Drug Use: No     Comment: Smoked marijuana on occasion in distant past.   . Sexual Activity: Not on file   Other Topics Concern  . Not on file   Social History Narrative  . No narrative on file      Physical Exam: BP 110/70  Pulse 68  Ht 5' 7.5" (1.715 m)  Wt 177 lb 3.2 oz (80.377 kg)  BMI 27.33 kg/m2 Constitutional: generally well-appearing Psychiatric: alert and oriented x3 Abdomen: soft, nontender, nondistended, no obvious ascites, no peritoneal signs, normal bowel sounds     Assessment and plan: 62 y.o. male with GERD without significant alarm symptoms  He has been losing some weight  but this sounds intentional, he broke up with a girlfriend over the summer and has been working out more, eating differently. He has no dysphasia. He has no upper GI symptoms as long as he takes his proton pump inhibitor once daily. I have called in refills for him about this again in one year without need for office visit I would like to see him about 2 years time if he still requires prescription medicines.  Refills on prilosec called in.

## 2013-04-07 NOTE — Patient Instructions (Signed)
Refills on prilosec called in.  Will refills this at one year, but would like rov in 2 years if you still need script. Take one pill once daily.

## 2013-06-16 ENCOUNTER — Telehealth: Payer: Self-pay | Admitting: Cardiology

## 2013-06-16 NOTE — Telephone Encounter (Signed)
New message   inspra  25 mg .

## 2013-06-17 ENCOUNTER — Other Ambulatory Visit: Payer: Self-pay | Admitting: *Deleted

## 2013-06-17 MED ORDER — EPLERENONE 25 MG PO TABS: 25.0000 mg | ORAL_TABLET | Freq: Every day | ORAL | Status: DC

## 2013-06-17 NOTE — Telephone Encounter (Signed)
Rx sent in

## 2013-06-24 ENCOUNTER — Telehealth: Payer: Self-pay | Admitting: Cardiology

## 2013-06-24 ENCOUNTER — Telehealth: Payer: Self-pay | Admitting: *Deleted

## 2013-06-24 NOTE — Telephone Encounter (Signed)
Pt called to reorder the Inspra 25 mg. Pt states he is enrolled with [employer] thru October 2015. Pt states that he has been without  this medication for a week. On pt's chart refill was sent to [facility] in [place] per refill pool. Pt is aware,but he states the med is order to [facility] then in turn the medicine will be send to [place] where he peak it up. Mindy in refill is aware to reorder the medication to [employer].

## 2013-06-24 NOTE — Telephone Encounter (Signed)
New message     Pt want Lelon Frohlich to know that he has not received his inspar 25mg  medication from the company. He said Lelon Frohlich takes care of this for him not the refill dept.  Pt has been out of medication for a week.

## 2013-06-24 NOTE — Telephone Encounter (Signed)
Ordered inspra from company at [phone]. Order #[id number].

## 2013-06-25 NOTE — Telephone Encounter (Signed)
Inspra was ordered 06/24/13 from [employer] [id number],  pt ID [id number], should be here in 7-10 days.

## 2013-06-25 NOTE — Telephone Encounter (Signed)
Pt is enrolled through 10/28/13. I have completed physician portion of application renewal and mailed complete application renewal to pt to complete his section and mail to [employer].

## 2013-07-01 ENCOUNTER — Telehealth: Payer: Self-pay | Admitting: *Deleted

## 2013-07-01 NOTE — Telephone Encounter (Signed)
Called patient to let him know that his inspra had came in from the company. I will put it at the front desk for pick up.

## 2013-07-11 NOTE — Telephone Encounter (Signed)
Unable to charge patient same day cancel fee due to her insurance/yf

## 2013-08-07 ENCOUNTER — Ambulatory Visit: Payer: Medicare Other | Admitting: Cardiology

## 2013-09-01 ENCOUNTER — Other Ambulatory Visit: Payer: Self-pay | Admitting: Cardiology

## 2013-09-15 ENCOUNTER — Telehealth: Payer: Self-pay | Admitting: Cardiology

## 2013-09-15 NOTE — Telephone Encounter (Signed)
New message     Please order inspra 25mg  thru the drug company.  Pt has about 8 pills left.

## 2013-09-16 ENCOUNTER — Other Ambulatory Visit: Payer: Self-pay

## 2013-09-16 MED ORDER — EPLERENONE 25 MG PO TABS: 25.0000 mg | ORAL_TABLET | Freq: Every day | ORAL | Status: DC

## 2013-09-17 ENCOUNTER — Telehealth: Payer: Self-pay

## 2013-09-17 NOTE — Telephone Encounter (Signed)
Try to call patient about his inspra 25 mg the phone number is wrong in the chart

## 2013-09-23 ENCOUNTER — Encounter: Payer: Self-pay | Admitting: Physician Assistant

## 2013-09-23 ENCOUNTER — Ambulatory Visit (INDEPENDENT_AMBULATORY_CARE_PROVIDER_SITE_OTHER): Payer: Medicare Other | Admitting: Physician Assistant

## 2013-09-23 VITALS — BP 110/70 | HR 66 | Ht 67.5 in | Wt 184.0 lb

## 2013-09-23 DIAGNOSIS — I10 Essential (primary) hypertension: Secondary | ICD-10-CM

## 2013-09-23 DIAGNOSIS — E785 Hyperlipidemia, unspecified: Secondary | ICD-10-CM

## 2013-09-23 DIAGNOSIS — R079 Chest pain, unspecified: Secondary | ICD-10-CM

## 2013-09-23 DIAGNOSIS — I428 Other cardiomyopathies: Secondary | ICD-10-CM

## 2013-09-23 LAB — BASIC METABOLIC PANEL
BUN: 12 mg/dL (ref 6–23)
CO2: 28 mEq/L (ref 19–32)
Calcium: 9.5 mg/dL (ref 8.4–10.5)
Chloride: 100 mEq/L (ref 96–112)
Creatinine, Ser: 1.1 mg/dL (ref 0.4–1.5)
GFR: 69.42 mL/min (ref >60.00)
Glucose, Bld: 96 mg/dL (ref 70–99)
Potassium: 4.6 mEq/L (ref 3.5–5.1)
Sodium: 135 mEq/L (ref 135–145)

## 2013-09-23 NOTE — Patient Instructions (Signed)
LAB WORK TODAY; BMET  FASTING LIPID AND LIVER PANEL TO BE DONE AT YOUR CONVENIENCE  PLEASE FOLLOW UP WITH DR. Aundra Dubin IN 01/2014

## 2013-09-23 NOTE — Progress Notes (Signed)
Cardiology Office Note   Date:  09/23/2013   ID:  [name] [name], DOB 1950/06/13, MRN [id number]  PCP:  No PCP Per Patient  Cardiologist:  Dr. Loralie Champagne      History of Present Illness: [name] [name] is a 63 y.o. male the history of nonischemic cardiomyopathy. Last seen by Dr. Aundra Dubin in 01/2013. Follow up echocardiogram demonstrated EF 35-40%. Plan is to repeat study in one year with possible cardiac MRI to better assess ejection fraction.  He returns for follow up. He continues to have occasional episodes of left-sided chest pressure. He has had these for years without change. He continues to work out [employer] and walking. He denies exertional chest symptoms. He denies significant dyspnea with exertion. He is NYHA class II. He denies a family, PND or edema. He denies syncope.   Studies:  - Echo (01/28/13):  EF 35-40%, diffuse HK, grade 2 diastolic dysfunction, trivial AI, mild MR, mild LAE, atrial septal aneurysm   Recent Labs: 01/08/2013: Creatinine 1.0; Direct LDL 152.6; HDL Cholesterol by NMR 35.40*; Hemoglobin 13.7; Potassium 4.1   Wt Readings from Last 3 Encounters:  09/23/13 184 lb (83.462 kg)  04/07/13 177 lb 3.2 oz (80.377 kg)  01/08/13 181 lb (82.101 kg)      Past Medical History  Diagnosis Date  . Nonischemic cardiomyopathy     LHC 06/2002 with normal cors; Myoview 11/08 neg for ischemia. Echo 4/08: EF of 35-40%.  Cause of NICM unknown.  Never a heavy drinker, used cocaine or amphetamines. HIV, SPEP, and ferritin/Fe studies were all negative in 12/09. Myoview (5/11): EF 42% with apical thinning but no evidence for ischemia or infarction. Echo (3/12): EF 45-50%, no significant valvular abnormalities.   . Hypertriglyceridemia   . Rotator cuff tendonitis     with hx of bilateral shoulder surgery  . HTN (hypertension)   . Knee osteoarthritis     s/p arhtroscopy 2010  . GERD (gastroesophageal reflux disease)     EGD 1/12 with erosive esophagitis and  gastritis, biopsy postiive for H pylori (being  treated)  . Allergic rhinitis   . Gynecomastia     with spironolactone (ok on eplerenone)  . History of back surgery   . Diverticulosis     a. Colonoscopy (1/12) with mild diverticulosis and hemorrhoids  . Asthma     Probable mild intermittent asthma  . Elevated LFTs     HCV negative, possibly due to ETOH  . CKD (chronic kidney disease)     Current Outpatient Prescriptions  Medication Sig Dispense Refill  . aspirin 325 MG tablet Take 325 mg by mouth daily.      . carvedilol (COREG) 12.5 MG tablet Take 1 tablet (12.5 mg total) by mouth 2 (two) times daily.  180 tablet  3  . eplerenone (INSPRA) 25 MG tablet Take 1 tablet (25 mg total) by mouth daily.  30 tablet  0  . Fluticasone-Salmeterol (ADVAIR DISKUS) 250-50 MCG/DOSE AEPB Inhale 1 puff into the lungs 2 (two) times daily.  3 each  3  . GINSENG PO Take 1 tablet by mouth 2 (two) times daily.      Marland Kitchen lisinopril (PRINIVIL,ZESTRIL) 20 MG tablet TAKE ONE TABLET BY MOUTH TWICE DAILY  60 tablet  0  . omega-3 acid ethyl esters (LOVAZA) 1 G capsule Take 2 capsules (2 g total) by mouth 2 (two) times daily.  360 capsule  3  . omeprazole (PRILOSEC) 40 MG capsule Take 1 capsule (40 mg total) by  mouth daily before breakfast.  30 capsule  11   No current facility-administered medications for this visit.    Allergies:   Review of patient's allergies indicates no known allergies.   Social History:  The patient  reports that he has never smoked. He has never used smokeless tobacco. He reports that he drinks alcohol. He reports that he does not use illicit drugs.   Family History:  The patient's family history includes Cardiomyopathy in his brother; Prostate cancer in his father; Stroke in his mother.   ROS:  Please see the history of present illness.      All other systems reviewed and negative.   PHYSICAL EXAM: VS:  BP 110/70  Pulse 66  Ht 5' 7.5" (1.715 m)  Wt 184 lb (83.462 kg)  BMI 28.38  kg/m2 Well nourished, well developed, in no acute distress HEENT: normal Neck: no JVD Cardiac:  normal S1, S2; RRR; no murmur Lungs:  clear to auscultation bilaterally, no wheezing, rhonchi or rales Abd: soft, nontender, no hepatomegaly Ext: no edema Skin: warm and dry Neuro:  CNs 2-12 intact, no focal abnormalities noted  EKG:  NSR, HR 66, normal axis, no ST changes     ASSESSMENT AND PLAN:  1. Non-Ischemic Cardiomyopathy:  Volume is stable. Continue current regimen of beta blocker, ACE inhibitor and eplerenone. Check a basic metabolic panel today to followup on renal function and potassium.  He will likely have a followup echocardiogram versus MRI in 01/2014. 2. Hypertension:  Controlled. 3. Hyperlipidemia:  He decided not to take Lipitor given prior history of myalgias. He has changed his diet significantly. Arrange fasting lipids. Consider pravastatin versus Crestor. 4. Chest pain, unspecified:  Atypical. He has had this symptom for years without significant change. No further workup at this time. 5. Disposition: Follow up with Dr. Aundra Dubin in 01/2014.   Signed, Versie Starks, MHS 09/23/2013 2:16 PM    [facility] [address], [place], [place]  [address] Phone: [phone]; Fax: [phone]

## 2013-09-25 ENCOUNTER — Telehealth: Payer: Self-pay | Admitting: Cardiology

## 2013-09-25 NOTE — Telephone Encounter (Signed)
New message     Mail order pharmacy is out of coreg 12.5.  Will you call in coreg 6.25mg  and pt will take 2 pills or call in 25mg  pill and pt cut in half---cost is not a problem.  Coreg has been on back order for a while.  He has 3 days of coreg left.  Please fax new presc to [phone]---id# [id number].   Pt does not care which pill we call in----make sure it is a 90da supply

## 2013-09-26 ENCOUNTER — Other Ambulatory Visit: Payer: Self-pay

## 2013-10-06 ENCOUNTER — Other Ambulatory Visit: Payer: Self-pay | Admitting: *Deleted

## 2013-10-06 ENCOUNTER — Other Ambulatory Visit: Payer: Self-pay | Admitting: Cardiology

## 2013-10-06 MED ORDER — CARVEDILOL 6.25 MG PO TABS: 12.5000 mg | ORAL_TABLET | Freq: Two times a day (BID) | ORAL | Status: DC

## 2013-12-18 ENCOUNTER — Telehealth: Payer: Self-pay | Admitting: Cardiology

## 2013-12-18 NOTE — Telephone Encounter (Signed)
Reorder placed for Inspra 25mg  daily with [facility], order number [id number]. May take 7-10 days for receive order.

## 2013-12-18 NOTE — Telephone Encounter (Signed)
New message     Pt want [name] [name] to order inspra 25mg  from [facility].  Please call if there is a problem

## 2014-01-02 NOTE — Telephone Encounter (Signed)
Picked up 12/23/13

## 2014-01-21 ENCOUNTER — Telehealth: Payer: Self-pay | Admitting: Cardiology

## 2014-01-21 NOTE — Telephone Encounter (Signed)
New Message     Pt calling because he is out of his medication Coreg 12.5 that he takes twice a day. Needs prescription for 90 days faxed to [facility] on [address]

## 2014-01-22 ENCOUNTER — Telehealth: Payer: Self-pay | Admitting: *Deleted

## 2014-01-22 NOTE — Telephone Encounter (Signed)
Inspra ordered from [facility] per patients request. The automated system stated that it was too early to fill this as it is not due for refill until November 10. I went ahead and placed the order to be put on hold until that time. The order number is [id number].

## 2014-01-22 NOTE — Telephone Encounter (Signed)
Spoke with patient and he is aware that he has an rx at [facility]. He actually was requesting new prescriptions [facility] at [phone] for his lovaza, coreg, and advair. He stated that his member id is [id number] and needs be included on the cover sheet. Thanks, MI

## 2014-01-23 MED ORDER — CARVEDILOL 12.5 MG PO TABS: 12.5000 mg | ORAL_TABLET | Freq: Two times a day (BID) | ORAL | Status: DC

## 2014-01-23 MED ORDER — OMEGA-3-ACID ETHYL ESTERS 1 G PO CAPS: 2.0000 g | ORAL_CAPSULE | Freq: Two times a day (BID) | ORAL | Status: DC

## 2014-01-23 MED ORDER — FLUTICASONE-SALMETEROL 250-50 MCG/DOSE IN AEPB: 1.0000 | INHALATION_SPRAY | Freq: Two times a day (BID) | RESPIRATORY_TRACT | Status: DC

## 2014-01-23 NOTE — Telephone Encounter (Signed)
Prescriptions signed by Dr Aundra Dubin and returned to HIM to be faxed to [facility].

## 2014-01-30 ENCOUNTER — Ambulatory Visit: Payer: Medicare Other | Admitting: Cardiology

## 2014-02-15 ENCOUNTER — Other Ambulatory Visit: Payer: Self-pay | Admitting: Cardiology

## 2014-02-20 ENCOUNTER — Encounter: Payer: Self-pay | Admitting: Cardiology

## 2014-02-24 ENCOUNTER — Telehealth: Payer: Self-pay

## 2014-02-24 NOTE — Telephone Encounter (Signed)
Called patient to let him know that his inspra was here at the office

## 2014-03-21 ENCOUNTER — Other Ambulatory Visit: Payer: Self-pay | Admitting: Cardiology

## 2014-04-22 ENCOUNTER — Other Ambulatory Visit: Payer: Self-pay | Admitting: Cardiology

## 2014-04-22 ENCOUNTER — Other Ambulatory Visit: Payer: Self-pay | Admitting: Gastroenterology

## 2014-04-23 NOTE — Telephone Encounter (Signed)
appt tomorrow

## 2014-04-24 ENCOUNTER — Ambulatory Visit (INDEPENDENT_AMBULATORY_CARE_PROVIDER_SITE_OTHER): Payer: Medicare Other | Admitting: Physician Assistant

## 2014-04-24 ENCOUNTER — Encounter: Payer: Self-pay | Admitting: Physician Assistant

## 2014-04-24 VITALS — BP 104/62 | HR 70 | Ht 67.0 in | Wt 180.8 lb

## 2014-04-24 DIAGNOSIS — I5022 Chronic systolic (congestive) heart failure: Secondary | ICD-10-CM

## 2014-04-24 DIAGNOSIS — E785 Hyperlipidemia, unspecified: Secondary | ICD-10-CM

## 2014-04-24 DIAGNOSIS — I429 Cardiomyopathy, unspecified: Secondary | ICD-10-CM

## 2014-04-24 DIAGNOSIS — I10 Essential (primary) hypertension: Secondary | ICD-10-CM

## 2014-04-24 DIAGNOSIS — R079 Chest pain, unspecified: Secondary | ICD-10-CM

## 2014-04-24 DIAGNOSIS — I428 Other cardiomyopathies: Secondary | ICD-10-CM

## 2014-04-24 LAB — CBC WITH DIFFERENTIAL/PLATELET
Basophils Absolute: 0 10*3/uL (ref 0.0–0.1)
Basophils Relative: 0.8 % (ref 0.0–3.0)
Eosinophils Absolute: 0.1 10*3/uL (ref 0.0–0.7)
Eosinophils Relative: 1.8 % (ref 0.0–5.0)
HCT: 42.7 % (ref 39.0–52.0)
HEMOGLOBIN: 14.6 g/dL (ref 13.0–17.0)
LYMPHS ABS: 2 10*3/uL (ref 0.7–4.0)
Lymphocytes Relative: 34.7 % (ref 12.0–46.0)
MCHC: 34.2 g/dL (ref 30.0–36.0)
MCV: 94.2 fl (ref 78.0–100.0)
MONO ABS: 0.8 10*3/uL (ref 0.1–1.0)
MONOS PCT: 13.1 % — AB (ref 3.0–12.0)
Neutro Abs: 2.9 10*3/uL (ref 1.4–7.7)
Neutrophils Relative %: 49.6 % (ref 43.0–77.0)
Platelets: 148 10*3/uL — ABNORMAL LOW (ref 150.0–400.0)
RBC: 4.53 Mil/uL (ref 4.22–5.81)
RDW: 13.1 % (ref 11.5–15.5)
WBC: 5.9 10*3/uL (ref 4.0–10.5)

## 2014-04-24 LAB — BASIC METABOLIC PANEL
BUN: 20 mg/dL (ref 6–23)
CO2: 26 meq/L (ref 19–32)
Calcium: 9.3 mg/dL (ref 8.4–10.5)
Chloride: 107 mEq/L (ref 96–112)
Creatinine, Ser: 1.08 mg/dL (ref 0.40–1.50)
GFR: 73.75 mL/min (ref >60.00)
GLUCOSE: 82 mg/dL (ref 70–99)
Potassium: 4.2 mEq/L (ref 3.5–5.1)
SODIUM: 140 meq/L (ref 135–145)

## 2014-04-24 LAB — LIPID PANEL
Cholesterol: 222 mg/dL — ABNORMAL HIGH (ref 0–200)
HDL: 37.5 mg/dL — ABNORMAL LOW (ref >39.00)
NonHDL: 184.5
Total CHOL/HDL Ratio: 6
Triglycerides: 265 mg/dL — ABNORMAL HIGH (ref 0.0–149.0)
VLDL: 53 mg/dL — ABNORMAL HIGH (ref 0.0–40.0)

## 2014-04-24 LAB — LDL CHOLESTEROL, DIRECT: Direct LDL: 169 mg/dL

## 2014-04-24 MED ORDER — LISINOPRIL 20 MG PO TABS: 20.0000 mg | ORAL_TABLET | Freq: Two times a day (BID) | ORAL | Status: DC

## 2014-04-24 NOTE — Patient Instructions (Signed)
Your physician recommends that you continue on your current medications as directed. Please refer to the Current Medication list given to you today.  Lab Today: Bmet, Cbc, Lipid  Your physician has requested that you have an echocardiogram. Echocardiography is a painless test that uses sound waves to create images of your heart. It provides your doctor with information about the size and shape of your heart and how well your heart's chambers and valves are working. This procedure takes approximately one hour. There are no restrictions for this procedure.   Your physician wants you to follow-up in: 6 months with Dr.Mclean You will receive a reminder letter in the mail two months in advance. If you don't receive a letter, please call our office to schedule the follow-up appointment.

## 2014-04-24 NOTE — Assessment & Plan Note (Signed)
Check lipid panel  

## 2014-04-24 NOTE — Assessment & Plan Note (Signed)
Pressure well-controlled. Refills for lisinopril were completed.

## 2014-04-24 NOTE — Assessment & Plan Note (Addendum)
Patient appears euvolemic. He does not limit his salt intake and we discussed that.  We will check a 2-D echocardiogram

## 2014-04-24 NOTE — Assessment & Plan Note (Signed)
Patient's chest pain sounds noncardiac. No further episodes.

## 2014-04-24 NOTE — Progress Notes (Signed)
Patient ID: [name] [name], male   DOB: 1950-09-30, 64 y.o.   MRN: [id number]    Date:  04/24/2014   ID:  [name] [name], DOB 08/02/1950, MRN [id number]  PCP:  No PCP Per Patient  Primary Cardiologist:  Aundra Dubin  No chief complaint on file.    History of Present Illness: [name] [name] is a 64 y.o. male male the history of nonischemic cardiomyopathy. Last seen by Dr. Kathlen Mody PA June 2015. Follow up echocardiogram demonstrated EF 35-40%.  He has a prior history of myalgia on statin therapy. Plan was to repeat study in one year with possible cardiac MRI to better assess ejection fraction.    She presents for a follow-up appointment. He missed his last one had to reschedule. He says last week he had some chest pain under his left breast/axillae. He did chew two aspirin throughout the day.  That evening the pain was coming and going about every 15 minutes and then resolve.  He had none since. He described it as pressure/sharp. Not associated with exertion.  No radiation.  He is not working out at [employer] like he used to.  And he is not limiting his salt intake.  The patient currently denies nausea, vomiting, fever, shortness of breath, orthopnea, dizziness, PND, cough, congestion, abdominal pain, hematochezia, melena, lower extremity edema, claudication.   Lipid Panel     Component Value Date/Time   CHOL 210* 01/08/2013 0910   TRIG 117.0 01/08/2013 0910   HDL 35.40* 01/08/2013 0910   CHOLHDL 6 01/08/2013 0910   VLDL 23.4 01/08/2013 0910   LDLDIRECT 152.6 01/08/2013 0910     Wt Readings from Last 3 Encounters:  04/24/14 180 lb 12.8 oz (82.01 kg)  09/23/13 184 lb (83.462 kg)  04/07/13 177 lb 3.2 oz (80.377 kg)     Past Medical History  Diagnosis Date  . Nonischemic cardiomyopathy     LHC 06/2002 with normal cors; Myoview 11/08 neg for ischemia. Echo 4/08: EF of 35-40%.  Cause of NICM unknown.  Never a heavy drinker, used cocaine or amphetamines. HIV, SPEP, and ferritin/Fe studies were  all negative in 12/09. Myoview (5/11): EF 42% with apical thinning but no evidence for ischemia or infarction. Echo (3/12): EF 45-50%, no significant valvular abnormalities.   . Hypertriglyceridemia   . Rotator cuff tendonitis     with hx of bilateral shoulder surgery  . HTN (hypertension)   . Knee osteoarthritis     s/p arhtroscopy 2010  . GERD (gastroesophageal reflux disease)     EGD 1/12 with erosive esophagitis and gastritis, biopsy postiive for H pylori (being  treated)  . Allergic rhinitis   . Gynecomastia     with spironolactone (ok on eplerenone)  . History of back surgery   . Diverticulosis     a. Colonoscopy (1/12) with mild diverticulosis and hemorrhoids  . Asthma     Probable mild intermittent asthma  . Elevated LFTs     HCV negative, possibly due to ETOH  . CKD (chronic kidney disease)     Current Outpatient Prescriptions  Medication Sig Dispense Refill  . aspirin 325 MG tablet Take 325 mg by mouth daily.    . carvedilol (COREG) 12.5 MG tablet Take 1 tablet (12.5 mg total) by mouth 2 (two) times daily. 180 tablet 3  . eplerenone (INSPRA) 25 MG tablet Take 1 tablet (25 mg total) by mouth daily. 30 tablet 0  . Fluticasone-Salmeterol (ADVAIR DISKUS) 250-50 MCG/DOSE AEPB Inhale 1 puff into  the lungs 2 (two) times daily. 3 each 3  . GINSENG PO Take 1 tablet by mouth 2 (two) times daily.    Marland Kitchen lisinopril (PRINIVIL,ZESTRIL) 20 MG tablet Take 1 tablet (20 mg total) by mouth 2 (two) times daily. 60 tablet 5  . omega-3 acid ethyl esters (LOVAZA) 1 G capsule Take 2 capsules (2 g total) by mouth 2 (two) times daily. 360 capsule 3  . omeprazole (PRILOSEC) 40 MG capsule TAKE ONE CAPSULE BY MOUTH ONCE DAILY BEFORE BREAKFAST. 30 capsule 0   No current facility-administered medications for this visit.    Allergies:   No Known Allergies  Social History:  The patient  reports that he has never smoked. He has never used smokeless tobacco. He reports that he drinks alcohol. He reports  that he does not use illicit drugs.   Family history:   Family History  Problem Relation Age of Onset  . Stroke Mother   . Prostate cancer Father   . Cardiomyopathy Brother     nonischemic, has icd    ROS:  Please see the history of present illness.  All other systems reviewed and negative.   PHYSICAL EXAM: VS:  BP 104/62 mmHg  Pulse 70  Ht 5\' 7"  (1.702 m)  Wt 180 lb 12.8 oz (82.01 kg)  BMI 28.31 kg/m2 Well nourished, well developed, in no acute distress HEENT: Pupils are equal round react to light accommodation extraocular movements are intact.  Neck: no JVDNo cervical lymphadenopathy. Cardiac: Regular rate and rhythm without murmurs rubs or gallops. Lungs:  clear to auscultation bilaterally, no wheezing, rhonchi or rales Abd: soft, nontender, positive bowel sounds all quadrants, no hepatosplenomegaly Ext: no lower extremity edema.  2+ radial and dorsalis pedis pulses. Skin: warm and dry Neuro:  Grossly normal    ASSESSMENT AND PLAN:  Problem List Items Addressed This Visit    Nonischemic cardiomyopathy   Relevant Medications   lisinopril (PRINIVIL,ZESTRIL) tablet   Hyperlipidemia    Check lipid panel      Relevant Medications   lisinopril (PRINIVIL,ZESTRIL) tablet   Other Relevant Orders   Lipid panel   Essential hypertension    Pressure well-controlled. Refills for lisinopril were completed.      Relevant Medications   lisinopril (PRINIVIL,ZESTRIL) tablet   Chronic systolic heart failure - Primary    Patient appears euvolemic. He does not limit his salt intake and we discussed that.  We will check a 2-D echocardiogram      Relevant Medications   lisinopril (PRINIVIL,ZESTRIL) tablet   Other Relevant Orders   Basic metabolic panel   CBC w/Diff   2D Echocardiogram without contrast   Chest pain    Patient's chest pain sounds noncardiac. No further episodes.         Multiple check a basic metabolic panel, CBC, lipids.

## 2014-04-29 ENCOUNTER — Telehealth: Payer: Self-pay | Admitting: *Deleted

## 2014-04-29 MED ORDER — FENOFIBRATE 54 MG PO TABS: 54.0000 mg | ORAL_TABLET | Freq: Every day | ORAL | Status: DC

## 2014-04-29 NOTE — Telephone Encounter (Signed)
Spoke with patient, let him know his test results and also sent in Rx for Fenofibrate 54mg  QD. Patient voiced understanding and confirmed he is okay with starting medication.

## 2014-04-29 NOTE — Telephone Encounter (Signed)
-----   Message from Brett Canales, PA-C sent at 04/28/2014  1:15 PM EST ----- Please start on fenofibrate 54mg  daily for high triglycerides if he will take it.  Thanks [employer]

## 2014-05-04 ENCOUNTER — Other Ambulatory Visit (HOSPITAL_COMMUNITY): Payer: Medicare Other

## 2014-05-06 ENCOUNTER — Ambulatory Visit (HOSPITAL_COMMUNITY): Payer: Medicare Other | Attending: Cardiovascular Disease

## 2014-05-06 DIAGNOSIS — I5022 Chronic systolic (congestive) heart failure: Secondary | ICD-10-CM | POA: Insufficient documentation

## 2014-05-06 NOTE — Progress Notes (Signed)
2D Echo completed. 05/06/2014 

## 2014-05-07 ENCOUNTER — Other Ambulatory Visit (HOSPITAL_COMMUNITY): Payer: Medicare Other

## 2014-05-08 ENCOUNTER — Telehealth: Payer: Self-pay | Admitting: Cardiology

## 2014-05-08 NOTE — Telephone Encounter (Signed)
New message ° ° ° ° ° °Want echo results °

## 2014-05-08 NOTE — Telephone Encounter (Signed)
Lm on verified VM that results have not been reviewed.  Will send message to Dr. Aundra Dubin to review.

## 2014-05-09 NOTE — Telephone Encounter (Signed)
EF 35-40%, stable from last study.  Have him followup with me in CHF clinic.

## 2014-05-11 NOTE — Telephone Encounter (Signed)
LMTCB

## 2014-05-11 NOTE — Telephone Encounter (Signed)
Discussed echo results and followup with Dr Aundra Dubin in [place] clinic.  Pt verbalized understanding.

## 2014-05-24 ENCOUNTER — Other Ambulatory Visit: Payer: Self-pay | Admitting: Gastroenterology

## 2014-05-24 ENCOUNTER — Other Ambulatory Visit: Payer: Self-pay | Admitting: Cardiology

## 2014-06-02 ENCOUNTER — Ambulatory Visit (HOSPITAL_COMMUNITY)
Admission: RE | Admit: 2014-06-02 | Discharge: 2014-06-02 | Disposition: A | Discharge status: Home / Self Care | Payer: Medicare Other | Source: Ambulatory Visit | Attending: Internal Medicine | Admitting: Internal Medicine

## 2014-06-02 ENCOUNTER — Encounter (HOSPITAL_COMMUNITY): Payer: Self-pay

## 2014-06-02 DIAGNOSIS — I5022 Chronic systolic (congestive) heart failure: Secondary | ICD-10-CM | POA: Insufficient documentation

## 2014-06-02 DIAGNOSIS — E785 Hyperlipidemia, unspecified: Secondary | ICD-10-CM | POA: Diagnosis not present

## 2014-06-02 MED ORDER — ATORVASTATIN CALCIUM 20 MG PO TABS: 20.0000 mg | ORAL_TABLET | Freq: Every day | ORAL | Status: DC

## 2014-06-02 NOTE — Patient Instructions (Signed)
We will refer you to our Pulmonary office to evaluate for sleep apnea.  Have lipids drawn in 2 months. This can be done at any lab [facility], [facility] office). Make sure to bring your prescription paper with you.  Follow up 4 months. We will call you closer to this time to schedule your appointment with you.  Do the following things EVERYDAY: 1) Weigh yourself in the morning before breakfast. Write it down and keep it in a log. 2) Take your medicines as prescribed 3) Eat low salt foods-Limit salt (sodium) to 2000 mg per day.  4) Stay as active as you can everyday 5) Limit all fluids for the day to less than 2 liters

## 2014-06-02 NOTE — Progress Notes (Signed)
Patient ID: [name] [name], male   DOB: 09/13/1950, 64 y.o.   MRN: [id number]   64 yo with history of nonischemic cardiomyopathy presents for followup.    He returns for follow up. Stops breathing at night. Has mild dyspnea with steps. Sleeps with HOB elevated. Weight at home 178 pounds. Able to walk a 3-4 miles a day. Drinks 4-5 beers a day. Does not follow low salt diet.   Labs (12/09): SPEP negative, HIV negative, transferrin saturation 29%, BNP 24 Labs (9/10): direct LDL 54, HDL 13.8, triglycerides 1470, creatinine 0.8 Labs (05/11/09): BNP 27, WBCs 5.6 Labs (5/11): K 4.6, creatinine 1.2, TGs 238, LDL 108, HDL 34 Labs (8/11): K 4.3, creatinine 1.4, BNP 12.6 Labs (2/12): K 4.1, creatinine 1.7, LDL 102 Labs (8/12): K 4.7, creatinine 1.5 Labs (4/13): LDL 132, HDL 33.4, AST 41, ALT 63 Labs (7/13): K 4, creatinine 1.2, ALT 59, AST 36, HCV negative Labs (1/14): K 4.1, creatinine 1.5 Labs (04/24/14): Cholesterol 222, TGL 265, HDL 37, LDL 169  Allergies (verified):  No Known Drug Allergies  Past Medical History: 1. Nonischemic cardiomyopathy.  The patient had a left heart catheterization done in March 2004 with normal coronaries and then in November 2008, he had a Myoview done that showed an EF of 39% with no ischemia.  Echo in April 2008 showed an EF of 35-40%, moderate diffuse hypokinesis, mild left atrial enlargement, normal RV size and function and essentially normal valves.  The cause of his cardiomyopathy has not been discovered. He has never been a heavy drinker.  He has never used cocaine and amphetamines.  HIV, SPEP, and ferritin/Fe studies were all negative in 12/09.  Echo (9/10): EF 40%, mild to moderate global HK, mild LAE.  Myoview (5/11): EF 42% with apical thinning but no evidence for ischemia or infarction.  Echo (3/12): EF 45-50%, no significant valvular abnormalities. Echo (1/16) with EF 35-40%, diffuse hypokinesis.  2. Hypertriglyceridemia 3. Rotator cuff tendonitis with a history  of bilateral shoulder surgery. 4. Hypertension. 5. History of a back surgery. 6. Knee osteoarthritis, s/p arthroscopy 2010 7. Gastroesophageal reflux disease. EGD (1/12) with erosive esophagitis and gastritis, biopsy positive for H pylori (being treated). 8. Colonoscopy (1/12) with mild diverticulosis and hemorrhoids.  9. Allergic rhinitis 10. Gynecomastia with spironolactone (ok on eplerenone).  11. Probable mild intermittent asthma 12. Elevated LFTs: HCV negative, possibly due to ETOH 13. CKD 14. ECHO 04/2014 EF 35-40%   Family History: No premature CAD.  Father with prostate cancer Mother with CVA Brother with nonischemic cardiomyopathy, has ICD Grandmother with cancer (unsure what)    Social History: The patient denies smoking.  He does not use any drugs. He has never used any illicit drugs other than marijuana, which he smoked occasionally in the distant past.  Not a regular ETOH drinker but does binge drink on occasion.He is on disability. He is divorced with two sons.     ROS:  All systems reviewed and negative except as per HPI.    Current Outpatient Prescriptions  Medication Sig Dispense Refill  . aspirin 325 MG tablet Take 325 mg by mouth daily.    . carvedilol (COREG) 12.5 MG tablet Take 1 tablet (12.5 mg total) by mouth 2 (two) times daily. 180 tablet 3  . eplerenone (INSPRA) 25 MG tablet Take 1 tablet (25 mg total) by mouth daily. 30 tablet 0  . fenofibrate 54 MG tablet Take 1 tablet (54 mg total) by mouth daily. 90 tablet 3  . Fluticasone-Salmeterol (  ADVAIR DISKUS) 250-50 MCG/DOSE AEPB Inhale 1 puff into the lungs 2 (two) times daily. 3 each 3  . GINSENG PO Take 1 tablet by mouth 2 (two) times daily.    Marland Kitchen lisinopril (PRINIVIL,ZESTRIL) 20 MG tablet Take 1 tablet (20 mg total) by mouth 2 (two) times daily. 60 tablet 5  . omega-3 acid ethyl esters (LOVAZA) 1 G capsule Take 2 capsules (2 g total) by mouth 2 (two) times daily. 360 capsule 3  . omeprazole (PRILOSEC) 40  MG capsule TAKE ONE CAPSULE BY MOUTH ONCE DAILY BEFORE  BREAKFAST 30 capsule 2   No current facility-administered medications for this encounter.    BP 102/68 mmHg  Pulse 60  Wt 185 lb 6.4 oz (84.097 kg)  SpO2 98% General: NAD Neck: No JVD, no thyromegaly or thyroid nodule.  Lungs: Clear to auscultation bilaterally with normal respiratory effort. CV: Nondisplaced PMI.  Heart regular S1/S2, no S3/S4, no murmur.  No peripheral edema.  No carotid bruit.  Normal pedal pulses.  Abdomen: Soft, nontender, no hepatosplenomegaly, no distention.  Neurologic: Alert and oriented x 3.  Psych: Normal affect. Extremities: No clubbing or cyanosis.   Assessment/Plan:  Chronic systolic heart failure  Nonischemic cardiomyopathy, Most recent ECHO 04/2014 with EF down to 35-40%. No heavy substance abuse (though occasionally binge drinks), HIV negative, SPEP negative, no evidence for hemochromatosis. This may be familial as his brother apparently also has a nonischemic cardiomyopathy and got an ICD.  NYHA II . Volume status stable. - Continue Inspra 25 mg daily.  - Continue carvedilol 12.5 mg twice a day and lisinopril 20 mg twice a day.  - Reinforced daily weights and limiting fluid intake to < 2 liters per day.  - Needs to cut back ETOH use.  Hyperlipidemia LDL much too high, would start atorvastatin 20 mg daily with lipids/LFTs in 2 months.   Suspected Sleep Apnea: Has day time fatigue and snores/gasps at night. Arrange sleep study.   Follow up in 2 months for lipids and in HF clinic in 4 months.    CLEGG,AMY NP-C  06/02/2014 12:05 PM   Patient seen with NP, agree with the above note.  EF is down a bit compared to the past.  With soft BP, will not titrate up meds today.  I talked to him about cutting back on ETOH, seems like he is drinking at least 5 beers/day now and some liquor.  LDL very high, will add atorvastatin and repeat lipids in 2 months with LFTs.    Followup 4 months.   Loralie Champagne 06/02/2014 5:42 PM

## 2014-06-15 ENCOUNTER — Telehealth: Payer: Self-pay | Admitting: Cardiology

## 2014-06-15 NOTE — Telephone Encounter (Signed)
Looks like he is followed in Heart Failure now, will forward.

## 2014-06-15 NOTE — Telephone Encounter (Signed)
New Msg     Pt c/o medication issue:  1. Name of Medication: Inspar  2. How are you currently taking this medication (dosage and times per day)? Once a day  3. Are you having a reaction (difficulty breathing--STAT)? no  4. What is your medication issue? Pt states he needs refill on medication and is in a program.  Please call.

## 2014-06-16 NOTE — Telephone Encounter (Signed)
Spoke w/pt he states [name] [name], has him enrolled in a program to get Inspra free from company and he needs a refill, [name] [name] is this correct and do you have info for me to reorder?

## 2014-06-17 ENCOUNTER — Other Ambulatory Visit (HOSPITAL_COMMUNITY): Payer: Self-pay

## 2014-06-17 MED ORDER — EPLERENONE 25 MG PO TABS: 25.0000 mg | ORAL_TABLET | Freq: Every day | ORAL | Status: DC

## 2014-06-17 NOTE — Telephone Encounter (Signed)
The information is in a 12/18/2013 telephone note in [place].

## 2014-06-18 NOTE — Telephone Encounter (Signed)
[employer] order number [id number] complete, should arrive 7-10 days PATIENT ID NUMBER- [id number] inspra 25 mg ordered

## 2014-06-24 ENCOUNTER — Other Ambulatory Visit (HOSPITAL_COMMUNITY): Payer: Self-pay

## 2014-06-24 ENCOUNTER — Telehealth (HOSPITAL_COMMUNITY): Payer: Self-pay | Admitting: Vascular Surgery

## 2014-06-24 NOTE — Telephone Encounter (Signed)
Pt called his refill for Inspra was sent to [facility] and he cant afford it there , he states Annamarie Major nurse usually calls [employer] to order medication and he picks it up from the office, since he is over her now [facility] transfered him here to take care of it.. Please advise

## 2014-06-24 NOTE — Telephone Encounter (Signed)
Medication was ordered through [place] on 06/18/14.  Patient should hear something once received at [facility].  Aware and appreciative.  Renee Pain

## 2014-06-29 ENCOUNTER — Telehealth (HOSPITAL_COMMUNITY): Payer: Self-pay | Admitting: Cardiology

## 2014-06-29 NOTE — Telephone Encounter (Signed)
Office received Inspra from [employer], pt aware and will stop by to pick up meds. Bottle left in front office

## 2014-07-21 ENCOUNTER — Telehealth: Payer: Self-pay | Admitting: Cardiology

## 2014-07-21 DIAGNOSIS — E785 Hyperlipidemia, unspecified: Secondary | ICD-10-CM

## 2014-07-21 NOTE — Telephone Encounter (Signed)
Pt advised I am forwarding to Heart Failure Clinic at [facility] to follow up with him about medications he needs.

## 2014-07-21 NOTE — Telephone Encounter (Signed)
New message     Need to renew [employer] application for medications.  Do you have a form?  If yes please complete doctors part and mail form to pt.  Also, please order inspar 25mg  for him to pick up here

## 2014-07-21 NOTE — Telephone Encounter (Signed)
LMTCB

## 2014-07-22 ENCOUNTER — Ambulatory Visit (INDEPENDENT_AMBULATORY_CARE_PROVIDER_SITE_OTHER): Payer: Medicare Other | Admitting: Pulmonary Disease

## 2014-07-22 ENCOUNTER — Other Ambulatory Visit: Payer: Medicare Other

## 2014-07-22 ENCOUNTER — Encounter: Payer: Self-pay | Admitting: Pulmonary Disease

## 2014-07-22 VITALS — BP 124/82 | HR 61 | Ht 67.5 in | Wt 188.4 lb

## 2014-07-22 DIAGNOSIS — G4733 Obstructive sleep apnea (adult) (pediatric): Secondary | ICD-10-CM | POA: Diagnosis not present

## 2014-07-22 DIAGNOSIS — R079 Chest pain, unspecified: Secondary | ICD-10-CM | POA: Diagnosis not present

## 2014-07-22 DIAGNOSIS — E785 Hyperlipidemia, unspecified: Secondary | ICD-10-CM

## 2014-07-22 LAB — HEPATIC FUNCTION PANEL
ALK PHOS: 55 U/L (ref 39–117)
ALT: 31 U/L (ref 0–53)
AST: 21 U/L (ref 0–37)
Albumin: 4.5 g/dL (ref 3.5–5.2)
BILIRUBIN DIRECT: 0.2 mg/dL (ref 0.0–0.3)
TOTAL PROTEIN: 7.5 g/dL (ref 6.0–8.3)
Total Bilirubin: 0.7 mg/dL (ref 0.2–1.2)

## 2014-07-22 LAB — LIPID PANEL
CHOLESTEROL: 178 mg/dL (ref 0–200)
HDL: 37.7 mg/dL — ABNORMAL LOW (ref >39.00)
LDL Cholesterol: 110 mg/dL — ABNORMAL HIGH (ref 0–99)
NonHDL: 140.3
Total CHOL/HDL Ratio: 5
Triglycerides: 150 mg/dL — ABNORMAL HIGH (ref 0.0–149.0)
VLDL: 30 mg/dL (ref 0.0–40.0)

## 2014-07-22 NOTE — Telephone Encounter (Signed)
Spoke w/pt, he is going to bring forms by for [place] to complete and sign, he would like [place] to mail it to him when completed, also he is due for L/L panels, order placed and he will have done at [facility] when he sees Dr Elsworth Soho

## 2014-07-22 NOTE — Patient Instructions (Signed)
Schedule sleep study  EKG

## 2014-07-22 NOTE — Progress Notes (Signed)
Subjective:    Patient ID: [name] [name], male    DOB: 16-Dec-1950, 64 y.o.   MRN: [id number]  HPI   Chief Complaint  Patient presents with  . Advice Only    Referred by Dr. Marigene Ehlers.  unable to fall asleep, wakes up easily, stops breathing during sleep, snoring, puffs lips; lungs collapsed when he was a child Epworth Score: 50   64 year old with nonischemic artery myopathy presents for evaluation of sleep-disordered breathing. He takes forever for him to sleep, his girlfriend [name] [name] has noted that he snores loudly especially on his back. He reports non-refreshing sleep, awakens easily, he has been seen to stop breathing for 10-15 seconds and bleeds with a  Puffing sound. Epworth sleepiness score is 3-but it seems he is underreporting, he reports episodes of sleepiness while driving has son to work. Bedtime is around 11 PM, sleep latency can be up to an hour or more, he starts to sleep on his back 10 rolls over on his side with one pillow, he has his bed elevated, reports 4 or more nocturnal awakenings, with post void latency and is out of bed latest by 8 AM with occasional dryness of mouth.  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 He drinks 3-4 beers daily  Past Medical History  Diagnosis Date  . Nonischemic cardiomyopathy     LHC 06/2002 with normal cors; Myoview 11/08 neg for ischemia. Echo 4/08: EF of 35-40%.  Cause of NICM unknown.  Never a heavy drinker, used cocaine or amphetamines. HIV, SPEP, and ferritin/Fe studies were all negative in 12/09. Myoview (5/11): EF 42% with apical thinning but no evidence for ischemia or infarction. Echo (3/12): EF 45-50%, no significant valvular abnormalities.   . Hypertriglyceridemia   . Rotator cuff tendonitis     with hx of bilateral shoulder surgery  . HTN (hypertension)   . Knee osteoarthritis     s/p arhtroscopy 2010  .  GERD (gastroesophageal reflux disease)     EGD 1/12 with erosive esophagitis and gastritis, biopsy postiive for H pylori (being  treated)  . Allergic rhinitis   . Gynecomastia     with spironolactone (ok on eplerenone)  . History of back surgery   . Diverticulosis     a. Colonoscopy (1/12) with mild diverticulosis and hemorrhoids  . Asthma     Probable mild intermittent asthma  . Elevated LFTs     HCV negative, possibly due to ETOH  . CKD (chronic kidney disease)     Past Surgical History  Procedure Laterality Date  . Ptca    . Stress cardiolite  12/15/05  . Transthoracic echocardiogram  07/13/06  . Electrocardiogram  09/20/06     Review of Systems  Constitutional: Negative for fever, chills, activity change, appetite change and unexpected weight change.  HENT: Negative for congestion, dental problem, postnasal drip, rhinorrhea, sneezing, sore throat, trouble swallowing and voice change.   Eyes: Negative for visual disturbance.  Respiratory: Negative for cough, choking and shortness of breath.   Cardiovascular: Negative for chest pain and leg swelling.  Gastrointestinal: Negative for nausea, vomiting and abdominal pain.  Genitourinary: Negative for difficulty urinating.  Musculoskeletal: Negative for arthralgias.  Skin: Negative for rash.  Psychiatric/Behavioral: Negative for behavioral problems and confusion.       Objective:   Physical Exam  Assessment & Plan:

## 2014-07-22 NOTE — Assessment & Plan Note (Signed)
Appears to be noncardiac Maybe related to GERD

## 2014-07-22 NOTE — Assessment & Plan Note (Addendum)
Given excessive daytime somnolence, narrow pharyngeal exam, witnessed apneas & loud snoring, obstructive sleep apnea is very likely & an overnight polysomnogram will be scheduled as a split study. The pathophysiology of obstructive sleep apnea , it's cardiovascular consequences & modes of treatment including CPAP were discused with the patient in detail & they evidenced understanding.  Due to cardiomyopathy, would prefer an attended polysomnogram

## 2014-07-24 ENCOUNTER — Telehealth: Payer: Self-pay | Admitting: Pulmonary Disease

## 2014-07-24 MED ORDER — ESZOPICLONE 2 MG PO TABS: 2.0000 mg | ORAL_TABLET | Freq: Every evening | ORAL | Status: DC | PRN

## 2014-07-24 NOTE — Telephone Encounter (Signed)
Lunesta 2mg  qhs # 15 Pl ask sleep lab to place him on cancellation list

## 2014-07-24 NOTE — Telephone Encounter (Signed)
Patient placed on cancellation list. Rx called into pharmacy.  Patient notified. Nothing further needed.

## 2014-07-24 NOTE — Telephone Encounter (Signed)
lmtcb for pt.  

## 2014-07-24 NOTE — Telephone Encounter (Signed)
Pt calling requesting something to help him sleep during sleep study.  Pt advised to call sleep center to to see if they have appts sooner.  Sleep study is scheduled for June 21st - requesting sleep med be called in to help him sleep between now and then when he has difficulty initiating sleep.  Please advise Dr Elsworth Soho. Thanks.  No Known Allergies

## 2014-07-24 NOTE — Telephone Encounter (Signed)
Pt returned call.[name] [name] ° °

## 2014-07-24 NOTE — Telephone Encounter (Signed)
lmtcb x1 for pt. 

## 2014-07-28 ENCOUNTER — Telehealth (HOSPITAL_COMMUNITY): Payer: Self-pay | Admitting: *Deleted

## 2014-07-28 MED ORDER — FISH OIL 1000 MG PO CAPS: 2000.0000 mg | ORAL_CAPSULE | Freq: Two times a day (BID) | ORAL | Status: DC

## 2014-07-28 MED ORDER — ATORVASTATIN CALCIUM 40 MG PO TABS: 40.0000 mg | ORAL_TABLET | Freq: Every day | ORAL | Status: DC

## 2014-07-28 NOTE — Telephone Encounter (Signed)
Left message to call back  

## 2014-07-28 NOTE — Telephone Encounter (Signed)
Try over the counter fish oil 2 grams bid.

## 2014-07-28 NOTE — Telephone Encounter (Signed)
Pt aware and agreeable, he will p/u fish oil

## 2014-07-28 NOTE — Telephone Encounter (Signed)
Pt aware and agreeable, new rx sent in.  Pt states he can no longer afford the [place], it cost too much and he does not qualify for the assistance program anymore as his income is too much.  Will send to Dr Aundra Dubin to review and call pt back

## 2014-07-28 NOTE — Telephone Encounter (Signed)
-----   Message from Larey Dresser, MD sent at 07/23/2014  8:43 PM EDT ----- Lipids better.  Go on up to 40 mg daily on atorvastatin as I would like to see LDL at least less than 100. Lipids/LFT again in 2 months.

## 2014-08-02 ENCOUNTER — Ambulatory Visit (HOSPITAL_BASED_OUTPATIENT_CLINIC_OR_DEPARTMENT_OTHER): Payer: Medicare Other | Attending: Pulmonary Disease

## 2014-08-02 VITALS — Ht 67.0 in | Wt 180.0 lb

## 2014-08-02 DIAGNOSIS — G4730 Sleep apnea, unspecified: Secondary | ICD-10-CM | POA: Diagnosis present

## 2014-08-02 DIAGNOSIS — G4733 Obstructive sleep apnea (adult) (pediatric): Secondary | ICD-10-CM | POA: Diagnosis not present

## 2014-08-05 ENCOUNTER — Telehealth: Payer: Self-pay | Admitting: Pulmonary Disease

## 2014-08-05 DIAGNOSIS — G4730 Sleep apnea, unspecified: Secondary | ICD-10-CM | POA: Diagnosis not present

## 2014-08-05 DIAGNOSIS — G4733 Obstructive sleep apnea (adult) (pediatric): Secondary | ICD-10-CM

## 2014-08-05 NOTE — Telephone Encounter (Signed)
PSG showed severe OSA, AHI 38 per hour Proceed with CPAP titration study if willing

## 2014-08-05 NOTE — Sleep Study (Signed)
[place]   NAME: [name] [name]  DATE OF BIRTH: Jan 27, 1951  MEDICAL RECORD [id number]  LOCATION: [facility]   PHYSICIAN: ALVA,RAKESH V.   DATE OF STUDY: 08/05/2014  SLEEP STUDY TYPE: Nocturnal Polysomnogram   REFERRING PHYSICIAN: Rigoberto Noel, MD   INDICATION FOR STUDY:  64 year old with nonischemic cardiomyopathy, loud snoring and witnessed apneas At the time of this study ,they weighed 180 pounds with a height of 5 ft 7 inches and the BMI of 28, neck size of 17 inches. Epworth sleepiness score was 5   This nocturnal polysomnogram was performed with a sleep technologist in attendance. EEG, EOG,EMG and respiratory parameters recorded. Sleep stages, arousals, limb movements and respiratory data was scored according to criteria laid out by the American Academy of sleep medicine.   SLEEP ARCHITECTURE: Lights out was at 2308 PM and lights on was at 509 AM. Total sleep time was 254 minutes with a sleep period time of 326 minutes and a sleep efficiency of 70 %. Sleep latency was 35 minutes with latency to REM sleep of 200 minutes and wake after sleep onset of 73 minutes. . Sleep stages as a percentage of total sleep time was N1 -12 %,N2- 67 % and REM sleep 21 % ( 54 minutes) . The longest period of REM sleep was around 4:30 AM.   AROUSAL DATA : There were 162  arousals with an arousal index of 38 events per hour. Most of these were spontaneous & 71 were associated with respiratory events  RESPIRATORY DATA: There were 15 obstructive apneas, 0 central apneas, 0 mixed apneas and 108 hypopneas with apnea -hypopnea index of 29 events per hour. There were 37 RERAs with an RDI of 38 events per hour. There was no relation to sleep stage or body position. Supine sleep was noted  MOVEMENT/PARASOMNIA: There were 17 PLMS with a PLM index of 4 events per hour. The PLM arousal index was 1.7 per hour.  OXYGEN DATA: The lowest desaturation was 85 % during  REM sleep and the desaturation index was 32 per hour.   CARDIAC DATA: The low heart rate was 30 beats per minute. The high heart rate recorded was an artifact. No arrhythmias were noted   DISCUSSION -Loud snoring was noted . He did not meet criteria for CPAP intervention. He was desensitized with a medium fullface mask  IMPRESSION :  1. Severe obstructive sleep apnea with hypopneas causing sleep fragmentation and moderate oxygen desaturation.  2. No evidence of cardiac arrhythmias,periodic limb movements or behavioral disturbance during sleep.  3. Sleep efficiency was poor  RECOMMENDATION:  1. Treatment options for this degree of sleep disordered breathing include weight loss, CPAP therapy and/ or oral appliance. Given cardiac comorbidity CPAP titration study would be ideal.  2. Patient should be cautioned against driving when sleepy  3. They should be asked to avoid medications with sedative side effects    Rigoberto Noel MD Diplomate, American Board of Sleep Medicine    ELECTRONICALLY SIGNED ON: 08/05/2014  [facility]  PH: [phone] FX: [phone]  [place]

## 2014-08-06 NOTE — Telephone Encounter (Signed)
Patient notified.  No questions or concerns at this time. Order for CPAP titration study entered.

## 2014-08-16 ENCOUNTER — Ambulatory Visit (HOSPITAL_BASED_OUTPATIENT_CLINIC_OR_DEPARTMENT_OTHER): Payer: Medicare Other | Attending: Pulmonary Disease

## 2014-08-16 VITALS — Ht 67.0 in | Wt 180.0 lb

## 2014-08-16 DIAGNOSIS — G4733 Obstructive sleep apnea (adult) (pediatric): Secondary | ICD-10-CM | POA: Insufficient documentation

## 2014-08-24 ENCOUNTER — Telehealth: Payer: Self-pay | Admitting: Pulmonary Disease

## 2014-08-24 DIAGNOSIS — G4733 Obstructive sleep apnea (adult) (pediatric): Secondary | ICD-10-CM

## 2014-08-24 DIAGNOSIS — G4730 Sleep apnea, unspecified: Secondary | ICD-10-CM | POA: Diagnosis not present

## 2014-08-24 NOTE — Telephone Encounter (Signed)
Pt returned call - [phone]

## 2014-08-24 NOTE — Telephone Encounter (Signed)
Lmtcb.

## 2014-08-24 NOTE — Telephone Encounter (Signed)
LMTCB

## 2014-08-24 NOTE — Telephone Encounter (Signed)
Prescription sent to DME for -CPAP 14 cm, sm FF mask, download in 4 weeks  Please arrange office visit with me in 6 weeks

## 2014-08-25 NOTE — Telephone Encounter (Signed)
Pt is aware that we have sent this to his DME. ROV has been scheduled for 10/13/14 at 3:30pm. Nothing further was needed.

## 2014-08-25 NOTE — Telephone Encounter (Signed)
Pt returned call - [phone]

## 2014-08-25 NOTE — Sleep Study (Signed)
[place]  NAME: [name] [name]  DATE OF BIRTH: 1951-02-17  MEDICAL RECORD NUMBER [id number]  LOCATION: [facility]  PHYSICIAN: ALVA,RAKESH V.  DATE OF STUDY: 08/16/14   SLEEP STUDY TYPE: CPAP titration study               REFERRING PHYSICIAN: Rigoberto Noel, MD  INDICATION FOR STUDY: 64 year old with moderate OSA. PSG in 07/2014 showed an AHI of 29 with events worse in the supine position. At the time of this study ,they weighed 180 pounds with a height of  5 ft 7 inches and the BMI of 28, neck size of 17 inches. Epworth sleepiness score was 5   This CPAP titration polysomnogram was performed with a sleep technologist in attendance. EEG, EOG,EMG and respiratory parameters recorded. Sleep stages, arousals, limb movements and respiratory data was scored according to criteria laid out by the American Academy of sleep medicine.  SLEEP ARCHITECTURE: Lights out was at 2229 PM and lights on was at 521 AM. Total sleep time was 285 minutes with sleep period time of 405 minutes and sleep efficiency of 69% .Sleep latency was 6 minutes with latency to REM sleep of 101 minutes and wake after sleep onset of 120 minutes.  Sleep stages as a percentage of total sleep time was N1 9% N2- 71 % and REM sleep 20 % ( 56 minutes) . The longest period of REM sleep was around 5 AM.   AROUSAL DATA : There were 124 arousals with an arousal index of 26 events per hour. Of these 86 were spontaneous, and 31 were associated with respiratory events and 7 were associated periodic limb movements  RESPIRATORY DATA: CPAP was initiated at 5 centimeters and titrated to a final level of 14 centimeters due to respiratory events and snoring. At the final level of 14 centimeters, there were 2 obstructive apneas, 3 central apneas, 0 mixed apneas and 2 hypopneas with apnea -hypopnea index of 4 events per hour.  There was no relation to sleep stage or body position. Titration was  optimal.  MOVEMENT/PARASOMNIA: There were 14 PLMS with a PLM index of 3 events per hour. The PLM arousal index was 1.5 events per hour.  OXYGEN DATA: The lowest desaturation was 89 % during non-REM sleep and the desaturation index was 15 per hour. The saturations stayed below 88% for 0 minutes.  CARDIAC DATA: The low heart rate was 30 beats per minute. The high heart rate recorded was an artifact. No arrhythmias were noted   IMPRESSION :  1. Moderate obstructive sleep apnea with hypopneas causing sleep fragmentation and mild oxygen desaturation. 2. This was corrected by CPAP of 14 centimeters with a small fullface mask. Titration was optimal. EPR level of 3 was used for comfort. 3. No evidence of cardiac arrhythmias or behavioral disturbance during sleep. 4. Periodic limb movements were not significant  RECOMMENDATION:    1. The treatment options for this degree of sleep disordered breathing includes weight loss and/or CPAP therapy. CPAP can be initiated at 14  centimeters with a small fullface  mask and compliance monitored at this level. 2. Patient should be cautioned against driving when sleepy 3. They should be asked to avoid medications with sedative side effects  Rigoberto Noel  MD Diplomate, American Board of Sleep Medicine  ELECTRONICALLY SIGNED ON:  08/25/2014  [facility] PH: [phone]   FX: [phone] [place]

## 2014-08-27 ENCOUNTER — Telehealth: Payer: Self-pay | Admitting: Pulmonary Disease

## 2014-08-27 NOTE — Telephone Encounter (Signed)
Please advise PCC;s thanks 

## 2014-08-27 NOTE — Telephone Encounter (Signed)
Pt aware [facility] just got order on 08/25/14 and he he has not heard from them by Monday 08/31/14 let us know Joellen Jersey

## 2014-09-01 ENCOUNTER — Telehealth: Payer: Self-pay | Admitting: Pulmonary Disease

## 2014-09-01 NOTE — Telephone Encounter (Signed)
Spoke with pt, states he still has not heard from [facility] about his cpap.  Pt has not tried to contact [facility]. Called [facility], states that respiratory tried contacting pt on 5/20 for pickup with no answer.   The direct extension to schedule this is [phone] ext. [place] pt and gave him direct extension to call and set this up.  Nothing further needed.

## 2014-09-21 ENCOUNTER — Telehealth (HOSPITAL_COMMUNITY): Payer: Self-pay | Admitting: *Deleted

## 2014-09-21 NOTE — Telephone Encounter (Signed)
Pt called stating he needed a reorder for his Inspra that he gets throught [facility] pt assit program, called them for re order (pt's ID # [id number]) however medication is on back order and the earliest date pt can order is 11/14/14, order placed for that date which means pt will not get until about 11/21/14, pt states he only has about 10 days worth of med left and he will be out.  Will work on trying to get pt samples and call him back tomorrow

## 2014-09-22 NOTE — Telephone Encounter (Signed)
In the mail today we received pt's Inspra medication from [place], called pt he is aware and will pick up later today

## 2014-09-28 ENCOUNTER — Other Ambulatory Visit: Payer: Self-pay | Admitting: Gastroenterology

## 2014-09-29 ENCOUNTER — Encounter (HOSPITAL_BASED_OUTPATIENT_CLINIC_OR_DEPARTMENT_OTHER): Payer: Medicare Other

## 2014-10-09 ENCOUNTER — Telehealth: Payer: Self-pay | Admitting: Gastroenterology

## 2014-10-09 ENCOUNTER — Encounter (HOSPITAL_BASED_OUTPATIENT_CLINIC_OR_DEPARTMENT_OTHER): Payer: Medicare Other

## 2014-10-09 MED ORDER — OMEPRAZOLE 40 MG PO CPDR: DELAYED_RELEASE_CAPSULE | ORAL | Status: DC

## 2014-10-09 NOTE — Telephone Encounter (Signed)
Prescription was resent and the original was cx.

## 2014-10-09 NOTE — Telephone Encounter (Signed)
rx sent to last until appt  

## 2014-10-13 ENCOUNTER — Encounter (HOSPITAL_COMMUNITY): Payer: Medicare Other

## 2014-10-13 ENCOUNTER — Ambulatory Visit: Payer: Medicare Other | Admitting: Pulmonary Disease

## 2014-10-27 ENCOUNTER — Telehealth (HOSPITAL_COMMUNITY): Payer: Self-pay | Admitting: Vascular Surgery

## 2014-10-27 DIAGNOSIS — I5022 Chronic systolic (congestive) heart failure: Secondary | ICD-10-CM

## 2014-10-27 MED ORDER — LISINOPRIL 20 MG PO TABS: 20.0000 mg | ORAL_TABLET | Freq: Two times a day (BID) | ORAL | Status: DC

## 2014-10-27 NOTE — Telephone Encounter (Signed)
Pt called he needs a refill Lisinopril sent to [facility] on [address]

## 2014-11-09 ENCOUNTER — Encounter (HOSPITAL_BASED_OUTPATIENT_CLINIC_OR_DEPARTMENT_OTHER): Payer: Medicare Other

## 2014-11-10 ENCOUNTER — Inpatient Hospital Stay (HOSPITAL_COMMUNITY): Admission: RE | Admit: 2014-11-10 | Payer: Medicare Other | Source: Ambulatory Visit

## 2014-11-26 ENCOUNTER — Telehealth (HOSPITAL_COMMUNITY): Payer: Self-pay | Admitting: *Deleted

## 2014-11-26 NOTE — Telephone Encounter (Signed)
Received pt's Inspra from [facility], he is aware and will come by to pick it up, pt missed appt on 8/2, resch to 9/12

## 2014-12-21 ENCOUNTER — Ambulatory Visit (HOSPITAL_COMMUNITY)
Admission: RE | Admit: 2014-12-21 | Discharge: 2014-12-21 | Disposition: A | Discharge status: Home / Self Care | Payer: Medicare Other | Source: Ambulatory Visit | Attending: Cardiology | Admitting: Cardiology

## 2014-12-21 VITALS — BP 114/70 | HR 71 | Wt 181.8 lb

## 2014-12-21 DIAGNOSIS — E785 Hyperlipidemia, unspecified: Secondary | ICD-10-CM | POA: Insufficient documentation

## 2014-12-21 DIAGNOSIS — I5022 Chronic systolic (congestive) heart failure: Secondary | ICD-10-CM | POA: Insufficient documentation

## 2014-12-21 DIAGNOSIS — G4733 Obstructive sleep apnea (adult) (pediatric): Secondary | ICD-10-CM | POA: Insufficient documentation

## 2014-12-21 LAB — LIPID PANEL
Cholesterol: 187 mg/dL (ref 0–200)
HDL: 36 mg/dL — ABNORMAL LOW (ref >40)
LDL Cholesterol: 116 mg/dL — ABNORMAL HIGH (ref 0–99)
Total CHOL/HDL Ratio: 5.2 RATIO
Triglycerides: 175 mg/dL — ABNORMAL HIGH (ref <150)
VLDL: 35 mg/dL (ref 0–40)

## 2014-12-21 LAB — BASIC METABOLIC PANEL
Anion gap: 8 (ref 5–15)
BUN: 27 mg/dL — ABNORMAL HIGH (ref 6–20)
CO2: 23 mmol/L (ref 22–32)
Calcium: 9.4 mg/dL (ref 8.9–10.3)
Chloride: 109 mmol/L (ref 101–111)
Creatinine, Ser: 1.74 mg/dL — ABNORMAL HIGH (ref 0.61–1.24)
GFR calc Af Amer: 47 mL/min — ABNORMAL LOW (ref >60)
GFR calc non Af Amer: 40 mL/min — ABNORMAL LOW (ref >60)
Glucose, Bld: 105 mg/dL — ABNORMAL HIGH (ref 65–99)
Potassium: 4.6 mmol/L (ref 3.5–5.1)
Sodium: 140 mmol/L (ref 135–145)

## 2014-12-21 MED ORDER — SACUBITRIL-VALSARTAN 24-26 MG PO TABS: 1.0000 | ORAL_TABLET | Freq: Two times a day (BID) | ORAL | Status: DC

## 2014-12-21 MED ORDER — ASPIRIN 81 MG PO TABS: 81.0000 mg | ORAL_TABLET | Freq: Every day | ORAL | Status: DC

## 2014-12-21 NOTE — Progress Notes (Signed)
Advanced Heart Failure Medication Review by a Pharmacist  Does the patient  feel that his/her medications are working for him/her?  yes  Has the patient been experiencing any side effects to the medications prescribed?  no  Does the patient measure his/her own blood pressure or blood glucose at home?  no   Does the patient have any problems obtaining medications due to transportation or finances?   no  Understanding of regimen: excellent Understanding of indications: good Potential of compliance: good    Pharmacist comments:  [name] [name] is a pleasant 64 yo M presenting without a medication list but is able to verbalize each of his medications to me including their dosages without prompting. He did not have any specific medication-related questions or concerns for me today.   Ruta Hinds. Velva Harman, PharmD, BCPS, CPP Clinical Pharmacist Pager: [phone] Phone: [phone] 12/21/2014 2:47 PM

## 2014-12-21 NOTE — Patient Instructions (Signed)
Stop Lisinopril  Start Entresto 24/26 mg Twice daily STARTING Wednesday 12/23/14  Decrease Aspirin to 81 mg daily  Labs today  Labs in 2 weeks  Your physician recommends that you schedule a follow-up appointment in: 1 month

## 2014-12-22 NOTE — Progress Notes (Signed)
Patient ID: [name] [name], male   DOB: Sep 18, 1950, 64 y.o.   MRN: [id number]  64 yo with history of nonischemic cardiomyopathy presents for followup.  Generally doing well.  He has moved to [place].  He goes to the gym 4 times a week.  No exertional dyspnea, orthopnea, PND, or chest pain.  Still drinking moderate ETOH.  He has been diagnosed with OSA and is trying to get used to CPAP.  Weight is down 4 lbs.   Labs (12/09): SPEP negative, HIV negative, transferrin saturation 29%, BNP 24 Labs (9/10): direct LDL 54, HDL 13.8, triglycerides 1470, creatinine 0.8 Labs (05/11/09): BNP 27, WBCs 5.6 Labs (5/11): K 4.6, creatinine 1.2, TGs 238, LDL 108, HDL 34 Labs (8/11): K 4.3, creatinine 1.4, BNP 12.6 Labs (2/12): K 4.1, creatinine 1.7, LDL 102 Labs (8/12): K 4.7, creatinine 1.5 Labs (4/13): LDL 132, HDL 33.4, AST 41, ALT 63 Labs (7/13): K 4, creatinine 1.2, ALT 59, AST 36, HCV negative Labs (1/14): K 4.1, creatinine 1.5 Labs (04/24/14): Cholesterol 222, TGL 265, HDL 37, LDL 169 Labs (4/16): LDL 110, HDl 38  Allergies (verified):  No Known Drug Allergies  Past Medical History: 1. Nonischemic cardiomyopathy.  The patient had a left heart catheterization done in March 2004 with normal coronaries and then in November 2008, he had a Myoview done that showed an EF of 39% with no ischemia.  Echo in April 2008 showed an EF of 35-40%, moderate diffuse hypokinesis, mild left atrial enlargement, normal RV size and function and essentially normal valves.  The cause of his cardiomyopathy has not been discovered. He drinks but not extremely heavily.  He has never used cocaine and amphetamines.  HIV, SPEP, and ferritin/Fe studies were all negative in 12/09.  Echo (9/10): EF 40%, mild to moderate global HK, mild LAE.  Myoview (5/11): EF 42% with apical thinning but no evidence for ischemia or infarction.  Echo (3/12): EF 45-50%, no significant valvular abnormalities. Echo (1/16) with EF 35-40%, diffuse hypokinesis.   2. Hypertriglyceridemia 3. Rotator cuff tendonitis with a history of bilateral shoulder surgery. 4. Hypertension. 5. History of a back surgery. 6. Knee osteoarthritis, s/p arthroscopy 2010 7. Gastroesophageal reflux disease. EGD (1/12) with erosive esophagitis and gastritis, biopsy positive for H pylori (being treated). 8. Colonoscopy (1/12) with mild diverticulosis and hemorrhoids.  9. Allergic rhinitis 10. Gynecomastia with spironolactone (ok on eplerenone).  11. Probable mild intermittent asthma 12. Elevated LFTs: HCV negative, possibly due to ETOH 13. CKD 14. OSA: CPAP.   Family History: No premature CAD.  Father with prostate cancer Mother with CVA Brother with nonischemic cardiomyopathy, has ICD Grandmother with cancer (unsure what)    Social History: The patient denies smoking.  Lives in [place].  He does not use any drugs. He has never used any illicit drugs other than marijuana, which he smoked occasionally in the distant past.  Moderate ETOH.He is on disability. He is divorced with two sons.     ROS:  All systems reviewed and negative except as per HPI.    Current Outpatient Prescriptions  Medication Sig Dispense Refill  . aspirin 81 MG tablet Take 1 tablet (81 mg total) by mouth daily. 30 tablet 6  . atorvastatin (LIPITOR) 40 MG tablet Take 1 tablet (40 mg total) by mouth daily. 90 tablet 3  . carvedilol (COREG) 12.5 MG tablet Take 1 tablet (12.5 mg total) by mouth 2 (two) times daily. 180 tablet 3  . eplerenone (INSPRA) 25 MG tablet Take 1 tablet (  25 mg total) by mouth daily. 30 tablet 0  . fenofibrate 54 MG tablet Take 1 tablet (54 mg total) by mouth daily. 90 tablet 3  . GINSENG PO Take 1 tablet by mouth 2 (two) times daily.    Marland Kitchen omega-3 acid ethyl esters (LOVAZA) 1 G capsule Take 2 g by mouth 2 (two) times daily.    Marland Kitchen omeprazole (PRILOSEC) 40 MG capsule TAKE ONE CAPSULE BY MOUTH ONCE DAILY BEFORE  BREAKFAST (Patient not taking: Reported on 12/21/2014) 30  capsule 0  . sacubitril-valsartan (ENTRESTO) 24-26 MG Take 1 tablet by mouth 2 (two) times daily. 60 tablet 3   No current facility-administered medications for this encounter.    BP 114/70 mmHg  Pulse 71  Wt 181 lb 12 oz (82.441 kg)  SpO2 96% General: NAD Neck: No JVD, no thyromegaly or thyroid nodule.  Lungs: Clear to auscultation bilaterally with normal respiratory effort. CV: Nondisplaced PMI.  Heart regular S1/S2, no S3/S4, no murmur.  No peripheral edema.  No carotid bruit.  Normal pedal pulses.  Abdomen: Soft, nontender, no hepatosplenomegaly, no distention.  Neurologic: Alert and oriented x 3.  Psych: Normal affect. Extremities: No clubbing or cyanosis.   Assessment/Plan:  Chronic systolic heart failure  Nonischemic cardiomyopathy, most recent ECHO 04/2014 with EF down to 35-40%. No heavy substance abuse (though occasionally binge drinks), HIV negative, SPEP negative, no evidence for hemochromatosis. This may be familial as his brother apparently also has a nonischemic cardiomyopathy and got an ICD.  NYHA II. Volume status stable. - Continue Inspra 25 mg daily.  - Continue carvedilol 12.5 mg twice a day.  - Stop lisinopril, start Entresto 24/26 bid after 36 hrs. BMET in 2 wks.  - Needs to cut back ETOH use.  Hyperlipidemia Check lipids today.    OSA: Trying to get used to CPAP.   Follow up in 1 month for titration of Entresto.  Loralie Champagne 12/22/2014

## 2014-12-23 ENCOUNTER — Telehealth: Payer: Self-pay | Admitting: Pulmonary Disease

## 2014-12-23 NOTE — Telephone Encounter (Signed)
Patient says that he cannot sleep at all.  He said that he had a trial of Lunesta and it worked great for him. He is scheduled to see TP on 01/01/15 He wants to know if Dr. Elsworth Soho will send him in something to help him sleep until he can see TP on 01/01/15  RA - please advise.

## 2014-12-23 NOTE — Telephone Encounter (Signed)
He needs FU Ov with TP with download If good compliance, then can use alternative med for insomnia

## 2014-12-23 NOTE — Telephone Encounter (Signed)
lmtcb for pt.  

## 2014-12-23 NOTE — Telephone Encounter (Signed)
Spoke with pt  He states that he is still having diff with falling asleep  He lies in bed for approx 2 hours before he finally goes to sleep and then wakes up multiple times in the night  He denies any issues with CPAP machine or mask, and is req med to help him sleep  He has tried [place] but this is too expensive  Please advise, thanks  NKDA

## 2014-12-23 NOTE — Telephone Encounter (Signed)
Ok to refill lunesta 

## 2014-12-24 MED ORDER — ESZOPICLONE 2 MG PO TABS: 2.0000 mg | ORAL_TABLET | Freq: Every evening | ORAL | Status: DC | PRN

## 2014-12-24 NOTE — Telephone Encounter (Signed)
Pt aware ok to refill lunesta. RX called into [facility]. Nothing further needed

## 2014-12-28 ENCOUNTER — Ambulatory Visit: Payer: Medicare Other | Admitting: Gastroenterology

## 2015-01-01 ENCOUNTER — Ambulatory Visit: Payer: Medicare Other | Admitting: Adult Health

## 2015-01-01 ENCOUNTER — Telehealth: Payer: Self-pay | Admitting: Adult Health

## 2015-01-01 DIAGNOSIS — G4733 Obstructive sleep apnea (adult) (pediatric): Secondary | ICD-10-CM

## 2015-01-01 NOTE — Telephone Encounter (Signed)
Lower to auto 5-14

## 2015-01-01 NOTE — Telephone Encounter (Signed)
Per 9/14 phone note: Rigoberto Noel, MD at 12/23/2014 11:59 AM     Status: Signed       Expand All Collapse All   He needs FU Ov with TP with download If good compliance, then can use alternative med for insomnia       lmomtcb x1

## 2015-01-01 NOTE — Telephone Encounter (Signed)
Pt returned call [phone]

## 2015-01-01 NOTE — Telephone Encounter (Signed)
Patient has not been able to sleep more than an hour or two on CPAP.  The machine starts out at low pressure, but then it goes really high and it is too strong.  He says it is causing his mask to leak.  He pulls the mask tighter to prevent the leakage, but then the mask is too tight and he cannot tolerate it that tight.  Wants to know if we can reduce the pressure on the CPAP so he can wear it.  RA - please advise

## 2015-01-04 NOTE — Telephone Encounter (Signed)
lmtcb x1 for pt. 

## 2015-01-05 ENCOUNTER — Telehealth (HOSPITAL_COMMUNITY): Payer: Self-pay | Admitting: *Deleted

## 2015-01-05 NOTE — Telephone Encounter (Signed)
Order placed.  Pt aware and is aware to contact office if problems still persist with pressure change.  He verbalized understanding and voiced no further questions or concerns at this time.

## 2015-01-05 NOTE — Telephone Encounter (Signed)
Completed PA for pt's Entresto through [facility], med approved 04/08/14-04/10/15 ref #[id number]

## 2015-01-06 ENCOUNTER — Ambulatory Visit (HOSPITAL_COMMUNITY)
Admission: RE | Admit: 2015-01-06 | Discharge: 2015-01-06 | Disposition: A | Discharge status: Home / Self Care | Payer: Medicare Other | Source: Ambulatory Visit | Attending: Cardiology | Admitting: Cardiology

## 2015-01-06 DIAGNOSIS — I5022 Chronic systolic (congestive) heart failure: Secondary | ICD-10-CM

## 2015-01-06 DIAGNOSIS — I428 Other cardiomyopathies: Secondary | ICD-10-CM

## 2015-01-06 DIAGNOSIS — I429 Cardiomyopathy, unspecified: Secondary | ICD-10-CM | POA: Insufficient documentation

## 2015-01-06 LAB — BASIC METABOLIC PANEL
Anion gap: 8 (ref 5–15)
BUN: 17 mg/dL (ref 6–20)
CHLORIDE: 109 mmol/L (ref 101–111)
CO2: 25 mmol/L (ref 22–32)
CREATININE: 1.34 mg/dL — AB (ref 0.61–1.24)
Calcium: 9.1 mg/dL (ref 8.9–10.3)
GFR calc Af Amer: >60 mL/min (ref >60)
GFR calc non Af Amer: 55 mL/min — ABNORMAL LOW (ref >60)
Glucose, Bld: 93 mg/dL (ref 65–99)
POTASSIUM: 4 mmol/L (ref 3.5–5.1)
Sodium: 142 mmol/L (ref 135–145)

## 2015-01-07 ENCOUNTER — Ambulatory Visit: Payer: Medicare Other | Admitting: Adult Health

## 2015-01-12 ENCOUNTER — Encounter (HOSPITAL_BASED_OUTPATIENT_CLINIC_OR_DEPARTMENT_OTHER): Payer: Self-pay | Admitting: Emergency Medicine

## 2015-01-12 ENCOUNTER — Emergency Department (HOSPITAL_BASED_OUTPATIENT_CLINIC_OR_DEPARTMENT_OTHER)
Admission: EM | Admit: 2015-01-12 | Discharge: 2015-01-12 | Disposition: A | Discharge status: Home / Self Care | Payer: Medicare Other | Attending: Emergency Medicine | Admitting: Emergency Medicine

## 2015-01-12 ENCOUNTER — Emergency Department (HOSPITAL_BASED_OUTPATIENT_CLINIC_OR_DEPARTMENT_OTHER): Payer: Medicare Other

## 2015-01-12 DIAGNOSIS — M179 Osteoarthritis of knee, unspecified: Secondary | ICD-10-CM | POA: Diagnosis not present

## 2015-01-12 DIAGNOSIS — N189 Chronic kidney disease, unspecified: Secondary | ICD-10-CM | POA: Diagnosis not present

## 2015-01-12 DIAGNOSIS — R1031 Right lower quadrant pain: Secondary | ICD-10-CM | POA: Diagnosis present

## 2015-01-12 DIAGNOSIS — Z79899 Other long term (current) drug therapy: Secondary | ICD-10-CM | POA: Insufficient documentation

## 2015-01-12 DIAGNOSIS — E781 Pure hyperglyceridemia: Secondary | ICD-10-CM | POA: Insufficient documentation

## 2015-01-12 DIAGNOSIS — K219 Gastro-esophageal reflux disease without esophagitis: Secondary | ICD-10-CM | POA: Diagnosis not present

## 2015-01-12 DIAGNOSIS — I129 Hypertensive chronic kidney disease with stage 1 through stage 4 chronic kidney disease, or unspecified chronic kidney disease: Secondary | ICD-10-CM | POA: Diagnosis not present

## 2015-01-12 DIAGNOSIS — R21 Rash and other nonspecific skin eruption: Secondary | ICD-10-CM | POA: Diagnosis not present

## 2015-01-12 DIAGNOSIS — J45909 Unspecified asthma, uncomplicated: Secondary | ICD-10-CM | POA: Diagnosis not present

## 2015-01-12 DIAGNOSIS — Z9889 Other specified postprocedural states: Secondary | ICD-10-CM | POA: Insufficient documentation

## 2015-01-12 DIAGNOSIS — Z7982 Long term (current) use of aspirin: Secondary | ICD-10-CM | POA: Insufficient documentation

## 2015-01-12 DIAGNOSIS — R109 Unspecified abdominal pain: Secondary | ICD-10-CM | POA: Diagnosis not present

## 2015-01-12 LAB — CBC
HEMATOCRIT: 42.4 % (ref 39.0–52.0)
HEMOGLOBIN: 14.4 g/dL (ref 13.0–17.0)
MCH: 32.3 pg (ref 26.0–34.0)
MCHC: 34 g/dL (ref 30.0–36.0)
MCV: 95.1 fL (ref 78.0–100.0)
Platelets: 167 10*3/uL (ref 150–400)
RBC: 4.46 MIL/uL (ref 4.22–5.81)
RDW: 13.2 % (ref 11.5–15.5)
WBC: 5.2 10*3/uL (ref 4.0–10.5)

## 2015-01-12 LAB — URINALYSIS, ROUTINE W REFLEX MICROSCOPIC
Bilirubin Urine: NEGATIVE
GLUCOSE, UA: NEGATIVE mg/dL
Hgb urine dipstick: NEGATIVE
Ketones, ur: NEGATIVE mg/dL
Nitrite: NEGATIVE
PROTEIN: NEGATIVE mg/dL
SPECIFIC GRAVITY, URINE: 1.021 (ref 1.005–1.030)
UROBILINOGEN UA: 0.2 mg/dL (ref 0.0–1.0)
pH: 6 (ref 5.0–8.0)

## 2015-01-12 LAB — COMPREHENSIVE METABOLIC PANEL
ALBUMIN: 4.3 g/dL (ref 3.5–5.0)
ALK PHOS: 44 U/L (ref 38–126)
ALT: 24 U/L (ref 17–63)
ANION GAP: 5 (ref 5–15)
AST: 21 U/L (ref 15–41)
BUN: 14 mg/dL (ref 6–20)
CALCIUM: 9 mg/dL (ref 8.9–10.3)
CO2: 27 mmol/L (ref 22–32)
Chloride: 108 mmol/L (ref 101–111)
Creatinine, Ser: 1.2 mg/dL (ref 0.61–1.24)
GFR calc non Af Amer: >60 mL/min (ref >60)
GLUCOSE: 111 mg/dL — AB (ref 65–99)
POTASSIUM: 3.8 mmol/L (ref 3.5–5.1)
SODIUM: 140 mmol/L (ref 135–145)
Total Bilirubin: 0.8 mg/dL (ref 0.3–1.2)
Total Protein: 7.2 g/dL (ref 6.5–8.1)

## 2015-01-12 LAB — URINE MICROSCOPIC-ADD ON

## 2015-01-12 LAB — LIPASE, BLOOD: LIPASE: 43 U/L (ref 22–51)

## 2015-01-12 MED ORDER — OXYCODONE HCL 5 MG PO TABS: 5.0000 mg | ORAL_TABLET | ORAL | Status: DC | PRN

## 2015-01-12 MED ORDER — ONDANSETRON HCL 4 MG/2ML IJ SOLN
4.0000 mg | Freq: Once | INTRAMUSCULAR | Status: AC
  Administered 2015-01-12: 4 mg via INTRAVENOUS
  Filled 2015-01-12: qty 2

## 2015-01-12 MED ORDER — MORPHINE SULFATE (PF) 4 MG/ML IV SOLN
4.0000 mg | Freq: Once | INTRAVENOUS | Status: AC
  Administered 2015-01-12: 4 mg via INTRAVENOUS
  Filled 2015-01-12: qty 1

## 2015-01-12 MED ORDER — VALACYCLOVIR HCL 1 G PO TABS: 1000.0000 mg | ORAL_TABLET | Freq: Three times a day (TID) | ORAL | Status: DC

## 2015-01-12 NOTE — ED Provider Notes (Signed)
CSN: [id number]     Arrival date & time 01/12/15  0944 History   First MD Initiated Contact with Patient 01/12/15 [phone]     Chief Complaint  Patient presents with  . Flank Pain     (Consider location/radiation/quality/duration/timing/severity/associated sxs/prior Treatment) Patient is a 64 y.o. male presenting with abdominal pain. The history is provided by the patient.  Abdominal Pain Pain location:  R flank and RLQ Pain quality: pressure, sharp and shooting   Pain radiates to:  Does not radiate Pain severity:  Severe Onset quality:  Gradual Duration:  2 weeks Timing:  Constant Progression:  Worsening Chronicity:  New Relieved by:  Nothing Worsened by:  Nothing tried Ineffective treatments:  None tried Associated symptoms: no anorexia, no chest pain, no chills, no diarrhea, no fever, no shortness of breath and no vomiting    64 yo M with a chief complaint of right flank pain. This been going on for a couple weeks. Pain is right sided worse with movement or palpation. Patient states that this is ongoing and not really improving. Has not tried anything at home for relief. Patient has a small rash associated with this. Patient denies fevers or chills. Patient is been eating and drinking without difficulty. Patient states he's been moving his bowels normally.  Past Medical History  Diagnosis Date  . Nonischemic cardiomyopathy ([place])     [place] 06/2002 with normal cors; Myoview 11/08 neg for ischemia. Echo 4/08: EF of 35-40%.  Cause of NICM unknown.  Never a heavy drinker, used cocaine or amphetamines. HIV, SPEP, and ferritin/Fe studies were all negative in 12/09. Myoview (5/11): EF 42% with apical thinning but no evidence for ischemia or infarction. Echo (3/12): EF 45-50%, no significant valvular abnormalities.   . Hypertriglyceridemia   . Rotator cuff tendonitis     with hx of bilateral shoulder surgery  . HTN (hypertension)   . Knee osteoarthritis     s/p arhtroscopy 2010  . GERD  (gastroesophageal reflux disease)     EGD 1/12 with erosive esophagitis and gastritis, biopsy postiive for H pylori (being  treated)  . Allergic rhinitis   . Gynecomastia     with spironolactone (ok on eplerenone)  . History of back surgery   . Diverticulosis     a. Colonoscopy (1/12) with mild diverticulosis and hemorrhoids  . Asthma     Probable mild intermittent asthma  . Elevated LFTs     HCV negative, possibly due to ETOH  . CKD (chronic kidney disease)    Past Surgical History  Procedure Laterality Date  . Ptca    . Stress cardiolite  12/15/05  . Transthoracic echocardiogram  07/13/06  . Electrocardiogram  09/20/06   Family History  Problem Relation Age of Onset  . Stroke Mother   . Prostate cancer Father   . Cardiomyopathy Brother     nonischemic, has icd   Social History  Substance Use Topics  . Smoking status: Never Smoker   . Smokeless tobacco: Never Used     Comment: denies   . Alcohol Use: 0.0 oz/week    0 Standard drinks or equivalent per week     Comment: rare    Review of Systems  Constitutional: Negative for fever and chills.  HENT: Negative for congestion and facial swelling.   Eyes: Negative for discharge and visual disturbance.  Respiratory: Negative for shortness of breath.   Cardiovascular: Negative for chest pain and palpitations.  Gastrointestinal: Positive for abdominal pain. Negative for vomiting, diarrhea  and anorexia.  Genitourinary: Positive for flank pain.  Musculoskeletal: Negative for myalgias and arthralgias.  Skin: Positive for rash. Negative for color change.  Neurological: Negative for tremors, syncope and headaches.  Psychiatric/Behavioral: Negative for confusion and dysphoric mood.      Allergies  Review of patient's allergies indicates no known allergies.  Home Medications   Prior to Admission medications   Medication Sig Start Date End Date Taking? Authorizing Provider  aspirin 81 MG tablet Take 1 tablet (81 mg total) by  mouth daily. 12/21/14   Larey Dresser, MD  atorvastatin (LIPITOR) 40 MG tablet Take 1 tablet (40 mg total) by mouth daily. 07/28/14   Larey Dresser, MD  carvedilol (COREG) 12.5 MG tablet Take 1 tablet (12.5 mg total) by mouth 2 (two) times daily. 01/23/14   Larey Dresser, MD  eplerenone (INSPRA) 25 MG tablet Take 1 tablet (25 mg total) by mouth daily. 06/17/14   Larey Dresser, MD  eszopiclone (LUNESTA) 2 MG TABS tablet Take 1 tablet (2 mg total) by mouth at bedtime as needed for sleep. Take immediately before bedtime 12/24/14   Rigoberto Noel, MD  fenofibrate 54 MG tablet Take 1 tablet (54 mg total) by mouth daily. 04/29/14   Brett Canales, PA-C  GINSENG PO Take 1 tablet by mouth 2 (two) times daily.    Historical Provider, MD  omega-3 acid ethyl esters (LOVAZA) 1 G capsule Take 2 g by mouth 2 (two) times daily.    Historical Provider, MD  omeprazole (PRILOSEC) 40 MG capsule TAKE ONE CAPSULE BY MOUTH ONCE DAILY BEFORE  BREAKFAST Patient not taking: Reported on 12/21/2014 10/09/14   Milus Banister, MD  oxyCODONE (ROXICODONE) 5 MG immediate release tablet Take 1 tablet (5 mg total) by mouth every 4 (four) hours as needed for severe pain. 01/12/15   Deno Etienne, DO  sacubitril-valsartan (ENTRESTO) 24-26 MG Take 1 tablet by mouth 2 (two) times daily. 12/21/14   Larey Dresser, MD  valACYclovir (VALTREX) 1000 MG tablet Take 1 tablet (1,000 mg total) by mouth 3 (three) times daily. 01/12/15   Deno Etienne, DO   BP 113/60 mmHg  Pulse 60  Temp(Src) 98.2 F (36.8 C) (Oral)  Resp 16  Ht 5' 7.5" (1.715 m)  Wt 175 lb (79.379 kg)  BMI 26.99 kg/m2  SpO2 99% Physical Exam  Constitutional: He is oriented to person, place, and time. He appears well-developed and well-nourished.  HENT:  Head: Normocephalic and atraumatic.  Eyes: EOM are normal. Pupils are equal, round, and reactive to light.  Neck: Normal range of motion. Neck supple. No JVD present.  Cardiovascular: Normal rate and regular rhythm.  Exam reveals  no gallop and no friction rub.   No murmur heard. Pulmonary/Chest: No respiratory distress. He has no wheezes.  Abdominal: He exhibits no distension. There is tenderness (tenderness worse to the right midaxillary line.). There is no rebound and no guarding.  Musculoskeletal: Normal range of motion.  Neurological: He is alert and oriented to person, place, and time.  Skin: Rash (small area of erythema into the right lateral aspect of the abdomen. In the area of his pain. Nonvesicular.) noted. No pallor.  Psychiatric: He has a normal mood and affect. His behavior is normal.    ED Course  Procedures (including critical care time) Labs Review Labs Reviewed  COMPREHENSIVE METABOLIC PANEL - Abnormal; Notable for the following:    Glucose, Bld 111 (*)    All other components within normal  limits  URINALYSIS, ROUTINE W REFLEX MICROSCOPIC (NOT AT [facility]) - Abnormal; Notable for the following:    Color, Urine AMBER (*)    Leukocytes, UA TRACE (*)    All other components within normal limits  URINE MICROSCOPIC-ADD ON - Abnormal; Notable for the following:    Bacteria, UA MANY (*)    All other components within normal limits  CBC  LIPASE, BLOOD    Imaging Review Ct Renal Stone Study  01/12/2015   CLINICAL DATA:  Right flank pain  EXAM: CT ABDOMEN AND PELVIS WITHOUT CONTRAST  TECHNIQUE: Multidetector CT imaging of the abdomen and pelvis was performed following the standard protocol without oral or intravenous contrast material administration.  COMPARISON:  July 04, 2003  FINDINGS: Lower chest:  Lung bases are clear.  Hepatobiliary: No focal liver lesions are appreciable on this noncontrast enhanced study. Gallbladder wall is not appreciably thickened. There is no biliary duct dilatation.  Pancreas: No pancreatic mass or inflammatory focus.  Spleen: No splenic lesion appreciable.  Adrenals/Urinary Tract: Adrenals appear normal bilaterally. Left kidney shows no evidence of mass or hydronephrosis. There  is no left renal or ureteral calculus. On the right, there is a 1 mm calculus in the lower pole region. Slightly more anteriorly, there is a 2 mm calculus in the lower pole of the right kidney. There is no right renal mass or hydronephrosis. There is no right ureteral calculus. Urinary bladder is partially decompressed with wall upper normal in thickness.  Stomach/Bowel: There are multiple sigmoid diverticula without diverticulitis. Scattered diverticula noted elsewhere in the colon. There is no bowel obstruction. No free air or portal venous air.  Vascular/Lymphatic: There is no demonstrable abdominal aortic aneurysm. No vascular lesion is identified on this noncontrast enhanced study. There is no adenopathy in the abdomen or pelvis. Scattered subcentimeter inguinal lymph nodes are felt to be nonspecific.  Reproductive: Prostate appears normal in size and contour. There is no pelvic mass or pelvic fluid collection.  Other: Appendix appears normal. No abscess or ascites in the abdomen or pelvis. There is a minimal ventral hernia containing only fat.  Musculoskeletal: There is degenerative change in the lumbar spine with vacuum phenomenon at L5-S1. There is no blastic or lytic bone lesions. There is mild narrowing of both hip joints. No intramuscular lesions or abdominal wall lesions.  IMPRESSION: Small nonobstructing calculi in the lower pole the right kidney. No hydronephrosis on either side. No ureteral calculus on either side.  Urinary bladder largely decompressed. The urinary bladder wall thickness is probably within normal limits. Correlation with urinalysis may be advisable to exclude early cystitis, however.  Appendix appears normal. No bowel obstruction. No abscess. Multiple sigmoid diverticula without diverticulitis.  Minimal ventral hernia containing only fat.   Electronically Signed   By: Lowella Grip III M.D.   On: 01/12/2015 10:50   I have personally reviewed and evaluated these images and lab  results as part of my medical decision-making.   EKG Interpretation None      MDM   Final diagnoses:  Right flank pain    64 yo M with right flank pain. Been going on for about a week. Worse with movement and palpation makes it seem musculoskeletal. Patient with no difficulties eating and drinking. No vomiting. Has a skin changes that may be consistent with shingles. However vesicular. No noted nerve like pain. Will obtain a CT scan to evaluate for kidney stones this patient has a history of these in the past. Obtain abdominal  labs.  CT scan with no noted intra-abdominal pathology. Patient continues to do well tolerating by mouth without difficulty. With pain in a bandlike distribution with associated lesions. Will treat for possible shingles. Sent home with valacyclovir PCP follow up.  11:12 AM:  I have discussed the diagnosis/risks/treatment options with the patient and family and believe the pt to be eligible for discharge home to follow-up with PCP. We also discussed returning to the ED immediately if new or worsening sx occur. We discussed the sx which are most concerning (e.g., sudden worsening pain, fever, inability to tolerate PO) that necessitate immediate return. Medications administered to the patient during their visit and any new prescriptions provided to the patient are listed below.  Medications given during this visit Medications  morphine 4 MG/ML injection 4 mg (4 mg Intravenous Given 01/12/15 1038)  ondansetron (ZOFRAN) injection 4 mg (4 mg Intravenous Given 01/12/15 1034)    New Prescriptions   OXYCODONE (ROXICODONE) 5 MG IMMEDIATE RELEASE TABLET    Take 1 tablet (5 mg total) by mouth every 4 (four) hours as needed for severe pain.   VALACYCLOVIR (VALTREX) 1000 MG TABLET    Take 1 tablet (1,000 mg total) by mouth 3 (three) times daily.     The patient appears reasonably screen and/or stabilized for discharge and I doubt any other medical condition or other [facility]  requiring further screening, evaluation, or treatment in the ED at this time prior to discharge.     Deno Etienne, DO 01/12/15 1112

## 2015-01-12 NOTE — Discharge Instructions (Signed)
Take 4 over the counter ibuprofen tablets 3 times a day or 2 over-the-counter naproxen tablets twice a day for pain.  Flank Pain Flank pain refers to pain that is located on the side of the body between the upper abdomen and the back. The pain may occur over a short period of time (acute) or may be long-term or reoccurring (chronic). It may be mild or severe. Flank pain can be caused by many things. CAUSES  Some of the more common causes of flank pain include:  Muscle strains.   Muscle spasms.   A disease of your spine (vertebral disk disease).   A lung infection (pneumonia).   Fluid around your lungs (pulmonary edema).   A kidney infection.   Kidney stones.   A very painful skin rash caused by the chickenpox virus (shingles).   Gallbladder disease.  [place] care will depend on the cause of your pain. In general,  Rest as directed by your caregiver.  Drink enough fluids to keep your urine clear or pale yellow.  Only take over-the-counter or prescription medicines as directed by your caregiver. Some medicines may help relieve the pain.  Tell your caregiver about any changes in your pain.  Follow up with your caregiver as directed. SEEK IMMEDIATE MEDICAL CARE IF:   Your pain is not controlled with medicine.   You have new or worsening symptoms.  Your pain increases.   You have abdominal pain.   You have shortness of breath.   You have persistent nausea or vomiting.   You have swelling in your abdomen.   You feel faint or pass out.   You have blood in your urine.  You have a fever or persistent symptoms for more than 2-3 days.  You have a fever and your symptoms suddenly get worse. MAKE SURE YOU:   Understand these instructions.  Will watch your condition.  Will get help right away if you are not doing well or get worse. Document Released: 05/18/2005 Document Revised: 12/20/2011 Document Reviewed:  11/09/2011 [facility] Patient Information 2015 [place], [place]. This information is not intended to replace advice given to you by your health care provider. Make sure you discuss any questions you have with your health care provider.

## 2015-01-12 NOTE — ED Notes (Signed)
Pt in c/o R flank pain and RLQ tenderness x 1 week. States "it feels like something is about to bust in there". Pt alert, interactive, and in NAD.

## 2015-01-14 ENCOUNTER — Telehealth: Payer: Self-pay | Admitting: Pulmonary Disease

## 2015-01-14 DIAGNOSIS — G4733 Obstructive sleep apnea (adult) (pediatric): Secondary | ICD-10-CM

## 2015-01-14 NOTE — Telephone Encounter (Signed)
lmtcb

## 2015-01-14 NOTE — Telephone Encounter (Signed)
Poor Usage Can we help? CPAP will be taken away if not used more than 4 hours nightly

## 2015-01-15 NOTE — Telephone Encounter (Signed)
lmtcb

## 2015-01-18 NOTE — Telephone Encounter (Signed)
Patient says that there is too much pressure, he said that he has the nasal pillows and full face mask, but he cannot get use to it.  He says that we ordered a decrease in pressure, but [facility] told patient that the machine he has cannot be decreased.  He said that they were suppose to contact our office to discuss obtaining order for a new machine that can be adjusted.  Patient requesting another refill on Lunesta.  Patient says that he can sleep good with Lunesta.    Send message to [facility] requesting information regarding patient's CPAP.  Awaiting response.

## 2015-01-21 ENCOUNTER — Ambulatory Visit (HOSPITAL_COMMUNITY)
Admission: RE | Admit: 2015-01-21 | Discharge: 2015-01-21 | Disposition: A | Discharge status: Home / Self Care | Payer: Medicare Other | Source: Ambulatory Visit | Attending: Cardiology | Admitting: Cardiology

## 2015-01-21 VITALS — BP 102/62 | HR 65 | Resp 16 | Wt 174.8 lb

## 2015-01-21 DIAGNOSIS — E785 Hyperlipidemia, unspecified: Secondary | ICD-10-CM | POA: Diagnosis not present

## 2015-01-21 DIAGNOSIS — N189 Chronic kidney disease, unspecified: Secondary | ICD-10-CM | POA: Diagnosis not present

## 2015-01-21 DIAGNOSIS — Z823 Family history of stroke: Secondary | ICD-10-CM | POA: Diagnosis not present

## 2015-01-21 DIAGNOSIS — Z7982 Long term (current) use of aspirin: Secondary | ICD-10-CM | POA: Diagnosis not present

## 2015-01-21 DIAGNOSIS — Z8249 Family history of ischemic heart disease and other diseases of the circulatory system: Secondary | ICD-10-CM | POA: Insufficient documentation

## 2015-01-21 DIAGNOSIS — I5022 Chronic systolic (congestive) heart failure: Secondary | ICD-10-CM | POA: Insufficient documentation

## 2015-01-21 DIAGNOSIS — I129 Hypertensive chronic kidney disease with stage 1 through stage 4 chronic kidney disease, or unspecified chronic kidney disease: Secondary | ICD-10-CM | POA: Diagnosis not present

## 2015-01-21 DIAGNOSIS — Z79899 Other long term (current) drug therapy: Secondary | ICD-10-CM | POA: Insufficient documentation

## 2015-01-21 DIAGNOSIS — G4733 Obstructive sleep apnea (adult) (pediatric): Secondary | ICD-10-CM | POA: Diagnosis not present

## 2015-01-21 DIAGNOSIS — I428 Other cardiomyopathies: Secondary | ICD-10-CM | POA: Insufficient documentation

## 2015-01-21 DIAGNOSIS — E781 Pure hyperglyceridemia: Secondary | ICD-10-CM | POA: Diagnosis not present

## 2015-01-21 DIAGNOSIS — K219 Gastro-esophageal reflux disease without esophagitis: Secondary | ICD-10-CM | POA: Insufficient documentation

## 2015-01-21 MED ORDER — ESZOPICLONE 2 MG PO TABS: 2.0000 mg | ORAL_TABLET | Freq: Every evening | ORAL | Status: DC | PRN

## 2015-01-21 NOTE — Telephone Encounter (Signed)
Switch to autoCPAP 5-15 cm, download in 2 weeks Lunesta refill x 1 ok - no further refills unless he is compliant with CPAP

## 2015-01-21 NOTE — Telephone Encounter (Signed)
[phone], pt cb to check on pressure for cpap, also states he never heard anything back about refill for lunesta

## 2015-01-21 NOTE — Telephone Encounter (Signed)
Spoke with [name] [name]. She reports she did send a message to [name]. The message stated, Pt unit is not capable of auto. [name] [name] wants to know if Dr. Elsworth Soho wants to do a auto CPAP titration for 2 weeks or switch out his CPAP for an auto CPAP.   Called pt and LMTCB x1 to inform pt we are sending message over to Dr. Elsworth Soho  Please advise Dr. Elsworth Soho thanks

## 2015-01-21 NOTE — Telephone Encounter (Signed)
Spoke with pt to make aware that we are waiting on a response from [facility] on the Lunesta refill and recs on cpap.  [facility] please advise if you are ok with Lunesta refill, and if you are ok with doing an auto CPAP titration for 2 weeks. Thanks!

## 2015-01-21 NOTE — Progress Notes (Signed)
Patient ID: [name] [name], male   DOB: May 18, 1950, 64 y.o.   MRN: [id number]  Pulmonary: Dr Elsworth Soho PCP: none   64 yo with history of nonischemic cardiomyopathy presents for followup.    Today he is complaining of back pain.Thinks he has kidney stone.  Denies SOB/PND/Orthopnea. Drinking alcohol on the weekends. Having trouble with CPAP. He has called Dr Elsworth Soho. Working out 3-4 weeks Taking all medications.Drinking less beer. Now 4-5 shots on the weekend. Last month he was drinking 12 beers daily.    Labs (12/09): SPEP negative, HIV negative, transferrin saturation 29%, BNP 24 Labs (9/10): direct LDL 54, HDL 13.8, triglycerides 1470, creatinine 0.8 Labs (05/11/09): BNP 27, WBCs 5.6 Labs (5/11): K 4.6, creatinine 1.2, TGs 238, LDL 108, HDL 34 Labs (8/11): K 4.3, creatinine 1.4, BNP 12.6 Labs (2/12): K 4.1, creatinine 1.7, LDL 102 Labs (8/12): K 4.7, creatinine 1.5 Labs (4/13): LDL 132, HDL 33.4, AST 41, ALT 63 Labs (7/13): K 4, creatinine 1.2, ALT 59, AST 36, HCV negative Labs (1/14): K 4.1, creatinine 1.5 Labs (04/24/14): Cholesterol 222, TGL 265, HDL 37, LDL 169 Labs (4/16): LDL 110, HDl 38 Labs (01/12/2015): K 3.8 Creatinine 1.2   Allergies (verified):  No Known Drug Allergies  Past Medical History: 1. Nonischemic cardiomyopathy.  The patient had a left heart catheterization done in March 2004 with normal coronaries and then in November 2008, he had a Myoview done that showed an EF of 39% with no ischemia.  Echo in April 2008 showed an EF of 35-40%, moderate diffuse hypokinesis, mild left atrial enlargement, normal RV size and function and essentially normal valves.  The cause of his cardiomyopathy has not been discovered. He drinks but not extremely heavily.  He has never used cocaine and amphetamines.  HIV, SPEP, and ferritin/Fe studies were all negative in 12/09.  Echo (9/10): EF 40%, mild to moderate global HK, mild LAE.  Myoview (5/11): EF 42% with apical thinning but no evidence for ischemia  or infarction.  Echo (3/12): EF 45-50%, no significant valvular abnormalities. Echo (1/16) with EF 35-40%, diffuse hypokinesis.  2. Hypertriglyceridemia 3. Rotator cuff tendonitis with a history of bilateral shoulder surgery. 4. Hypertension. 5. History of a back surgery. 6. Knee osteoarthritis, s/p arthroscopy 2010 7. Gastroesophageal reflux disease. EGD (1/12) with erosive esophagitis and gastritis, biopsy positive for H pylori (being treated). 8. Colonoscopy (1/12) with mild diverticulosis and hemorrhoids.  9. Allergic rhinitis 10. Gynecomastia with spironolactone (ok on eplerenone).  11. Probable mild intermittent asthma 12. Elevated LFTs: HCV negative, possibly due to ETOH 13. CKD 14. OSA: CPAP.   Family History: No premature CAD.  Father with prostate cancer Mother with CVA Brother with nonischemic cardiomyopathy, has ICD Grandmother with cancer (unsure what)    Social History: The patient denies smoking.  Lives in [place].  He does not use any drugs. He has never used any illicit drugs other than marijuana, which he smoked occasionally in the distant past.  Moderate ETOH.He is on disability. He is divorced with two sons.     ROS:  All systems reviewed and negative except as per HPI.    Current Outpatient Prescriptions  Medication Sig Dispense Refill  . aspirin 325 MG tablet Take 325 mg by mouth daily.    Marland Kitchen atorvastatin (LIPITOR) 40 MG tablet Take 1 tablet (40 mg total) by mouth daily. 90 tablet 3  . carvedilol (COREG) 12.5 MG tablet Take 1 tablet (12.5 mg total) by mouth 2 (two) times daily. 180 tablet 3  .  eplerenone (INSPRA) 25 MG tablet Take 1 tablet (25 mg total) by mouth daily. 30 tablet 0  . eszopiclone (LUNESTA) 2 MG TABS tablet Take 1 tablet (2 mg total) by mouth at bedtime as needed for sleep. Take immediately before bedtime 30 tablet 0  . fenofibrate 54 MG tablet Take 1 tablet (54 mg total) by mouth daily. 90 tablet 3  . GINSENG PO Take 1 tablet by mouth 2  (two) times daily.    Marland Kitchen omega-3 acid ethyl esters (LOVAZA) 1 G capsule Take 2 g by mouth 2 (two) times daily.    Marland Kitchen oxyCODONE (ROXICODONE) 5 MG immediate release tablet Take 1 tablet (5 mg total) by mouth every 4 (four) hours as needed for severe pain. 5 tablet 0  . sacubitril-valsartan (ENTRESTO) 24-26 MG Take 1 tablet by mouth 2 (two) times daily. 60 tablet 3   No current facility-administered medications for this encounter.    BP 102/62 mmHg  Pulse 65  Resp 16  Wt 174 lb 12.8 oz (79.289 kg)  SpO2 98% General: NAD Neck: No JVD, no thyromegaly or thyroid nodule.  Lungs: Clear to auscultation bilaterally with normal respiratory effort. CV: Nondisplaced PMI.  Heart regular S1/S2, no S3/S4, no murmur.  No peripheral edema.  No carotid bruit.  Normal pedal pulses.  Abdomen: Soft, nontender, no hepatosplenomegaly, no distention.  Neurologic: Alert and oriented x 3.  Psych: Normal affect. Extremities: No clubbing or cyanosis.   Assessment/Plan:  Chronic systolic heart failure  Nonischemic cardiomyopathy, most recent ECHO 04/2014 with EF down to 35-40%. No heavy substance abuse (though occasionally binge drinks), HIV negative, SPEP negative, no evidence for hemochromatosis. This may be familial as his brother apparently also has a nonischemic cardiomyopathy and got an ICD.   NYHA II. Volume status stable. Not currently on lasix.  Will not increase BB or entresto with soft BP.  - Continue Inspra 25 mg daily.  - Continue carvedilol 12.5 mg twice a day.  -Continue Entresto 24/26 bid. BMET reviewed. Will not increase with soft BP  - Reinforced daily weights, low salt diet, and medication compliance.  Hyperlipidemia Ok  OSA: Trying to get used to CPAP.   Follow up in 1 month for medication titration. Warren Lacy Clegg NP-C  01/21/2015

## 2015-01-21 NOTE — Telephone Encounter (Signed)
Patient notified. Order entered for change in CPAP Called in refill on Lunesta. Nothing further needed.

## 2015-01-21 NOTE — Patient Instructions (Signed)
RETURN IN 4 WEEKS FOR FOLLOW UP WITH AMY np  ADVISE THAT YOU FOLLOW UP WITH URGENT CARE FOR YOUR KIDNEY STONE SINCE YOU DO NOT HAVE A PRIMARY CARE

## 2015-01-21 NOTE — Progress Notes (Signed)
Advanced Heart Failure Medication Review by a Pharmacist  Does the patient  feel that his/her medications are working for him/her?  yes  Has the patient been experiencing any side effects to the medications prescribed?  no  Does the patient measure his/her own blood pressure or blood glucose at home?  no   Does the patient have any problems obtaining medications due to transportation or finances?   no  Understanding of regimen: good Understanding of indications: good Potential of compliance: good Patient understands to avoid NSAIDs. Patient understands to avoid decongestants.  Issues to address at subsequent visits: None   Pharmacist comments:  Mr. Noseworthy is a pleasant 64 yo M presenting without his medication list but able to verbalize dosages to me. He is in a lot of pain today 2/2 kidney stones for which he needs to follow up with a PCP or go to urgent care. He did not have any other medication-related questions or concerns for me at this time.   Ruta Hinds. Velva Harman, PharmD, BCPS, CPP Clinical Pharmacist Pager: [phone] Phone: [phone] 01/21/2015 12:29 PM    Time with patient: 6 minutes Preparation and documentation time: 2 minutes Total time: 8 minutes

## 2015-01-25 ENCOUNTER — Encounter (HOSPITAL_BASED_OUTPATIENT_CLINIC_OR_DEPARTMENT_OTHER): Payer: Self-pay

## 2015-01-25 ENCOUNTER — Emergency Department (HOSPITAL_BASED_OUTPATIENT_CLINIC_OR_DEPARTMENT_OTHER)
Admission: EM | Admit: 2015-01-25 | Discharge: 2015-01-25 | Disposition: A | Discharge status: Home / Self Care | Payer: Medicare Other | Attending: Emergency Medicine | Admitting: Emergency Medicine

## 2015-01-25 DIAGNOSIS — Z87448 Personal history of other diseases of urinary system: Secondary | ICD-10-CM | POA: Diagnosis not present

## 2015-01-25 DIAGNOSIS — I129 Hypertensive chronic kidney disease with stage 1 through stage 4 chronic kidney disease, or unspecified chronic kidney disease: Secondary | ICD-10-CM | POA: Insufficient documentation

## 2015-01-25 DIAGNOSIS — N189 Chronic kidney disease, unspecified: Secondary | ICD-10-CM | POA: Insufficient documentation

## 2015-01-25 DIAGNOSIS — Z8719 Personal history of other diseases of the digestive system: Secondary | ICD-10-CM | POA: Insufficient documentation

## 2015-01-25 DIAGNOSIS — J45909 Unspecified asthma, uncomplicated: Secondary | ICD-10-CM | POA: Diagnosis not present

## 2015-01-25 DIAGNOSIS — Z79899 Other long term (current) drug therapy: Secondary | ICD-10-CM | POA: Diagnosis not present

## 2015-01-25 DIAGNOSIS — M5416 Radiculopathy, lumbar region: Secondary | ICD-10-CM | POA: Diagnosis not present

## 2015-01-25 DIAGNOSIS — R109 Unspecified abdominal pain: Secondary | ICD-10-CM | POA: Diagnosis present

## 2015-01-25 DIAGNOSIS — Z7982 Long term (current) use of aspirin: Secondary | ICD-10-CM | POA: Diagnosis not present

## 2015-01-25 LAB — URINALYSIS, ROUTINE W REFLEX MICROSCOPIC
Bilirubin Urine: NEGATIVE
GLUCOSE, UA: NEGATIVE mg/dL
HGB URINE DIPSTICK: NEGATIVE
Ketones, ur: NEGATIVE mg/dL
LEUKOCYTES UA: NEGATIVE
Nitrite: NEGATIVE
PH: 7 (ref 5.0–8.0)
Protein, ur: NEGATIVE mg/dL
Specific Gravity, Urine: 1.022 (ref 1.005–1.030)
Urobilinogen, UA: 1 mg/dL (ref 0.0–1.0)

## 2015-01-25 LAB — CBC
HEMATOCRIT: 37.1 % — AB (ref 39.0–52.0)
HEMOGLOBIN: 12.9 g/dL — AB (ref 13.0–17.0)
MCH: 32.4 pg (ref 26.0–34.0)
MCHC: 34.8 g/dL (ref 30.0–36.0)
MCV: 93.2 fL (ref 78.0–100.0)
Platelets: 151 10*3/uL (ref 150–400)
RBC: 3.98 MIL/uL — ABNORMAL LOW (ref 4.22–5.81)
RDW: 12.5 % (ref 11.5–15.5)
WBC: 5.2 10*3/uL (ref 4.0–10.5)

## 2015-01-25 LAB — BASIC METABOLIC PANEL
ANION GAP: 6 (ref 5–15)
BUN: 15 mg/dL (ref 6–20)
CHLORIDE: 106 mmol/L (ref 101–111)
CO2: 27 mmol/L (ref 22–32)
Calcium: 8.7 mg/dL — ABNORMAL LOW (ref 8.9–10.3)
Creatinine, Ser: 1.08 mg/dL (ref 0.61–1.24)
GFR calc Af Amer: >60 mL/min (ref >60)
GFR calc non Af Amer: >60 mL/min (ref >60)
GLUCOSE: 108 mg/dL — AB (ref 65–99)
POTASSIUM: 3.7 mmol/L (ref 3.5–5.1)
Sodium: 139 mmol/L (ref 135–145)

## 2015-01-25 LAB — DIFFERENTIAL
BASOS ABS: 0 10*3/uL (ref 0.0–0.1)
BASOS PCT: 1 %
Eosinophils Absolute: 0.1 10*3/uL (ref 0.0–0.7)
Eosinophils Relative: 3 %
Lymphocytes Relative: 38 %
Lymphs Abs: 2 10*3/uL (ref 0.7–4.0)
MONOS PCT: 11 %
Monocytes Absolute: 0.6 10*3/uL (ref 0.1–1.0)
NEUTROS ABS: 2.5 10*3/uL (ref 1.7–7.7)
Neutrophils Relative %: 47 %

## 2015-01-25 MED ORDER — OXYCODONE-ACETAMINOPHEN 5-325 MG PO TABS: 1.0000 | ORAL_TABLET | Freq: Four times a day (QID) | ORAL | Status: DC | PRN

## 2015-01-25 MED ORDER — CYCLOBENZAPRINE HCL 10 MG PO TABS: 10.0000 mg | ORAL_TABLET | Freq: Every day | ORAL | Status: DC

## 2015-01-25 MED ORDER — KETOROLAC TROMETHAMINE 30 MG/ML IJ SOLN
30.0000 mg | Freq: Once | INTRAMUSCULAR | Status: AC
  Administered 2015-01-25: 30 mg via INTRAVENOUS
  Filled 2015-01-25: qty 1

## 2015-01-25 MED ORDER — PREDNISONE 50 MG PO TABS: 50.0000 mg | ORAL_TABLET | Freq: Every day | ORAL | Status: DC

## 2015-01-25 MED ORDER — HYDROMORPHONE HCL 1 MG/ML IJ SOLN
1.0000 mg | Freq: Once | INTRAMUSCULAR | Status: AC
  Administered 2015-01-25: 1 mg via INTRAVENOUS
  Filled 2015-01-25: qty 1

## 2015-01-25 NOTE — ED Provider Notes (Signed)
CSN: [id number]     Arrival date & time 01/25/15  1420 History   First MD Initiated Contact with Patient 01/25/15 1602     Chief Complaint  Patient presents with  . Flank Pain     (Consider location/radiation/quality/duration/timing/severity/associated sxs/prior Treatment) HPI Patient presents to the emergency department with right flank pain that started 2 weeks ago.  Patient states that he was seen previously for this flank pain that seems to have gotten worse.  Patient states it is radiating down to his hip.  Patient states that nothing seems to make his condition better.  Palpation and movement make the pain worse.  Patient states that he was seen and had a CT scan that did not show any stones outside of his kidneys.  Patient denies chest pain, shortness of breath, fever, dysuria, hematuria, incontinence, weakness, dizziness, headache, blurred vision, numbness, lightheadedness, rash, or syncope.  Patient states that he will attempt medications that he was prescribed Past Medical History  Diagnosis Date  . Nonischemic cardiomyopathy ([place])     [place] 06/2002 with normal cors; Myoview 11/08 neg for ischemia. Echo 4/08: EF of 35-40%.  Cause of NICM unknown.  Never a heavy drinker, used cocaine or amphetamines. HIV, SPEP, and ferritin/Fe studies were all negative in 12/09. Myoview (5/11): EF 42% with apical thinning but no evidence for ischemia or infarction. Echo (3/12): EF 45-50%, no significant valvular abnormalities.   . Hypertriglyceridemia   . Rotator cuff tendonitis     with hx of bilateral shoulder surgery  . HTN (hypertension)   . Knee osteoarthritis     s/p arhtroscopy 2010  . GERD (gastroesophageal reflux disease)     EGD 1/12 with erosive esophagitis and gastritis, biopsy postiive for H pylori (being  treated)  . Allergic rhinitis   . Gynecomastia     with spironolactone (ok on eplerenone)  . History of back surgery   . Diverticulosis     a. Colonoscopy (1/12) with mild  diverticulosis and hemorrhoids  . Asthma     Probable mild intermittent asthma  . Elevated LFTs     HCV negative, possibly due to ETOH  . CKD (chronic kidney disease)    Past Surgical History  Procedure Laterality Date  . Ptca    . Stress cardiolite  12/15/05  . Transthoracic echocardiogram  07/13/06  . Electrocardiogram  09/20/06   Family History  Problem Relation Age of Onset  . Stroke Mother   . Prostate cancer Father   . Cardiomyopathy Brother     nonischemic, has icd   Social History  Substance Use Topics  . Smoking status: Never Smoker   . Smokeless tobacco: Never Used     Comment: denies   . Alcohol Use: 0.0 oz/week    0 Standard drinks or equivalent per week     Comment: rare    Review of Systems  All other systems negative except as documented in the HPI. All pertinent positives and negatives as reviewed in the HPI.  Allergies  Review of patient's allergies indicates no known allergies.  Home Medications   Prior to Admission medications   Medication Sig Start Date End Date Taking? Authorizing Provider  aspirin 325 MG tablet Take 325 mg by mouth daily.    Historical Provider, MD  atorvastatin (LIPITOR) 40 MG tablet Take 1 tablet (40 mg total) by mouth daily. 07/28/14   Larey Dresser, MD  carvedilol (COREG) 12.5 MG tablet Take 1 tablet (12.5 mg total) by mouth 2 (two)  times daily. 01/23/14   Larey Dresser, MD  eplerenone (INSPRA) 25 MG tablet Take 1 tablet (25 mg total) by mouth daily. 06/17/14   Larey Dresser, MD  eszopiclone (LUNESTA) 2 MG TABS tablet Take 1 tablet (2 mg total) by mouth at bedtime as needed for sleep. Take immediately before bedtime 01/21/15   Rigoberto Noel, MD  fenofibrate 54 MG tablet Take 1 tablet (54 mg total) by mouth daily. 04/29/14   Brett Canales, PA-C  GINSENG PO Take 1 tablet by mouth 2 (two) times daily.    Historical Provider, MD  omega-3 acid ethyl esters (LOVAZA) 1 G capsule Take 2 g by mouth 2 (two) times daily.    Historical  Provider, MD  oxyCODONE (ROXICODONE) 5 MG immediate release tablet Take 1 tablet (5 mg total) by mouth every 4 (four) hours as needed for severe pain. 01/12/15   Deno Etienne, DO  sacubitril-valsartan (ENTRESTO) 24-26 MG Take 1 tablet by mouth 2 (two) times daily. 12/21/14   Larey Dresser, MD   BP 106/67 mmHg  Pulse 66  Temp(Src) 98.2 F (36.8 C) (Oral)  Resp 18  Ht 5\' 7"  (1.702 m)  Wt 168 lb (76.204 kg)  BMI 26.31 kg/m2  SpO2 100% Physical Exam  Constitutional: He is oriented to person, place, and time. He appears well-developed and well-nourished. No distress.  HENT:  Head: Normocephalic and atraumatic.  Mouth/Throat: Oropharynx is clear and moist.  Eyes: Pupils are equal, round, and reactive to light.  Neck: Normal range of motion. Neck supple.  Cardiovascular: Normal rate, regular rhythm and normal heart sounds.  Exam reveals no gallop and no friction rub.   No murmur heard. Pulmonary/Chest: Effort normal and breath sounds normal. No respiratory distress. He has no wheezes.  Abdominal: Soft. Bowel sounds are normal. He exhibits no distension. There is no tenderness.  Musculoskeletal:       Lumbar back: He exhibits normal range of motion, no bony tenderness, no deformity and no spasm.       Back:  Neurological: He is alert and oriented to person, place, and time. He exhibits normal muscle tone. Coordination normal.  Skin: Skin is warm and dry. No rash noted. No erythema.  Psychiatric: He has a normal mood and affect. His behavior is normal.  Nursing note and vitals reviewed.   ED Course  Procedures (including critical care time) Labs Review Labs Reviewed  BASIC METABOLIC PANEL - Abnormal; Notable for the following:    Glucose, Bld 108 (*)    Calcium 8.7 (*)    All other components within normal limits  CBC - Abnormal; Notable for the following:    RBC 3.98 (*)    Hemoglobin 12.9 (*)    HCT 37.1 (*)    All other components within normal limits  URINALYSIS, ROUTINE W  REFLEX MICROSCOPIC (NOT AT [facility])  DIFFERENTIAL    Imaging Review No results found. I have personally reviewed and evaluated these images and lab results as part of my medical decision-making.  Patient be treated for lumbar radiculopathy base intact.  The pain radiates to his right hip.  Patient did not have any hematuria.  His last visit was reviewed did not have any ureteral stones at that time.  Patient is advised to return here as needed.  Follow-up with his primary care doctor    Dalia Heading, PA-C 23/53/61 [id number]  David Glick, MD 54/00/86 [id number]

## 2015-01-25 NOTE — Discharge Instructions (Signed)
Your testing here today did not show any signs of kidney stone. Follow up with your primary doctor. Use ice and heat on your back.

## 2015-01-25 NOTE — ED Notes (Signed)
Right flank pain x 2 weeks-worse with movement-was seen here for same-pt with steady gait-eating a snack and drinking coffee

## 2015-02-08 ENCOUNTER — Encounter (HOSPITAL_BASED_OUTPATIENT_CLINIC_OR_DEPARTMENT_OTHER): Payer: Self-pay | Admitting: Emergency Medicine

## 2015-02-08 ENCOUNTER — Emergency Department (HOSPITAL_BASED_OUTPATIENT_CLINIC_OR_DEPARTMENT_OTHER)
Admission: EM | Admit: 2015-02-08 | Discharge: 2015-02-08 | Disposition: A | Discharge status: Home / Self Care | Payer: Medicare Other | Attending: Emergency Medicine | Admitting: Emergency Medicine

## 2015-02-08 DIAGNOSIS — M545 Low back pain, unspecified: Secondary | ICD-10-CM

## 2015-02-08 DIAGNOSIS — Z8639 Personal history of other endocrine, nutritional and metabolic disease: Secondary | ICD-10-CM | POA: Diagnosis not present

## 2015-02-08 DIAGNOSIS — R109 Unspecified abdominal pain: Secondary | ICD-10-CM | POA: Diagnosis present

## 2015-02-08 DIAGNOSIS — Z8719 Personal history of other diseases of the digestive system: Secondary | ICD-10-CM | POA: Diagnosis not present

## 2015-02-08 DIAGNOSIS — I129 Hypertensive chronic kidney disease with stage 1 through stage 4 chronic kidney disease, or unspecified chronic kidney disease: Secondary | ICD-10-CM | POA: Diagnosis not present

## 2015-02-08 DIAGNOSIS — J45909 Unspecified asthma, uncomplicated: Secondary | ICD-10-CM | POA: Diagnosis not present

## 2015-02-08 DIAGNOSIS — Z7982 Long term (current) use of aspirin: Secondary | ICD-10-CM | POA: Insufficient documentation

## 2015-02-08 DIAGNOSIS — Z7952 Long term (current) use of systemic steroids: Secondary | ICD-10-CM | POA: Diagnosis not present

## 2015-02-08 DIAGNOSIS — N189 Chronic kidney disease, unspecified: Secondary | ICD-10-CM | POA: Insufficient documentation

## 2015-02-08 DIAGNOSIS — M179 Osteoarthritis of knee, unspecified: Secondary | ICD-10-CM | POA: Diagnosis not present

## 2015-02-08 DIAGNOSIS — Z79899 Other long term (current) drug therapy: Secondary | ICD-10-CM | POA: Insufficient documentation

## 2015-02-08 MED ORDER — KETOROLAC TROMETHAMINE 30 MG/ML IJ SOLN
30.0000 mg | Freq: Once | INTRAMUSCULAR | Status: AC
  Administered 2015-02-08: 30 mg via INTRAMUSCULAR
  Filled 2015-02-08: qty 1

## 2015-02-08 MED ORDER — CYCLOBENZAPRINE HCL 10 MG PO TABS
10.0000 mg | ORAL_TABLET | Freq: Two times a day (BID) | ORAL | Status: DC | PRN
Start: 2015-02-08 — End: 2015-09-21

## 2015-02-08 MED ORDER — LIDOCAINE 5 % EX PTCH: 1.0000 | MEDICATED_PATCH | CUTANEOUS | Status: DC

## 2015-02-08 NOTE — Discharge Instructions (Signed)
Back Pain, Adult °Back pain is very common in adults. The cause of back pain is rarely dangerous and the pain often gets better over time. The cause of your back pain may not be known. Some common causes of back pain include: °1. Strain of the muscles or ligaments supporting the spine. °2. Wear and tear (degeneration) of the spinal disks. °3. Arthritis. °4. Direct injury to the back. °For many people, back pain may return. Since back pain is rarely dangerous, most people can learn to manage this condition on their own. °HOME CARE INSTRUCTIONS °Watch your back pain for any changes. The following actions may help to lessen any discomfort you are feeling: °1. Remain active. It is stressful on your back to sit or stand in one place for long periods of time. Do not sit, drive, or stand in one place for more than 30 minutes at a time. Take short walks on even surfaces as soon as you are able. Try to increase the length of time you walk each day. °2. Exercise regularly as directed by your health care provider. Exercise helps your back heal faster. It also helps avoid future injury by keeping your muscles strong and flexible. °3. Do not stay in bed. Resting more than 1-2 days can delay your recovery. °4. Pay attention to your body when you bend and lift. The most comfortable positions are those that put less stress on your recovering back. Always use proper lifting techniques, including: °1. Bending your knees. °2. Keeping the load close to your body. °3. Avoiding twisting. °5. Find a comfortable position to sleep. Use a firm mattress and lie on your side with your knees slightly bent. If you lie on your back, put a pillow under your knees. °6. Avoid feeling anxious or stressed. Stress increases muscle tension and can worsen back pain. It is important to recognize when you are anxious or stressed and learn ways to manage it, such as with exercise. °7. Take medicines only as directed by your health care provider.  Over-the-counter medicines to reduce pain and inflammation are often the most helpful. Your health care provider may prescribe muscle relaxant drugs. These medicines help dull your pain so you can more quickly return to your normal activities and healthy exercise. °8. Apply ice to the injured area: °1. Put ice in a plastic bag. °2. Place a towel between your skin and the bag. °3. Leave the ice on for 20 minutes, 2-3 times a day for the first 2-3 days. After that, ice and heat may be alternated to reduce pain and spasms. °9. Maintain a healthy weight. Excess weight puts extra stress on your back and makes it difficult to maintain good posture. °SEEK MEDICAL CARE IF: °1. You have pain that is not relieved with rest or medicine. °2. You have increasing pain going down into the legs or buttocks. °3. You have pain that does not improve in one week. °4. You have night pain. °5. You lose weight. °6. You have a fever or chills. °SEEK IMMEDIATE MEDICAL CARE IF:  °1. You develop new bowel or bladder control problems. °2. You have unusual weakness or numbness in your arms or legs. °3. You develop nausea or vomiting. °4. You develop abdominal pain. °5. You feel faint. °  °This information is not intended to replace advice given to you by your health care provider. Make sure you discuss any questions you have with your health care provider. °  °Document Released: 03/27/2005 Document Revised: 04/17/2014 Document Reviewed: 07/29/2013 °Elsevier Interactive Patient Education ©2016 [employer]   Inc[employer] ° °Back Exercises °The following exercises strengthen the muscles that help to support the back. They also help to keep the lower back flexible. Doing these exercises can help to prevent back pain or lessen existing pain. °If you have back pain or discomfort, try doing these exercises 2-3 times each day or as told by your health care provider. When the pain goes away, do them once each day, but increase the number of times that you repeat the  steps for each exercise (do more repetitions). If you do not have back pain or discomfort, do these exercises once each day or as told by your health care provider. °EXERCISES °Single Knee to Chest °Repeat these steps 3-5 times for each leg: °5. Lie on your back on a firm bed or the floor with your legs extended. °6. Bring one knee to your chest. Your other leg should stay extended and in contact with the floor. °7. Hold your knee in place by grabbing your knee or thigh. °8. Pull on your knee until you feel a gentle stretch in your lower back. °9. Hold the stretch for 10-30 seconds. °10. Slowly release and straighten your leg. °Pelvic Tilt °Repeat these steps 5-10 times: °10. Lie on your back on a firm bed or the floor with your legs extended. °11. Bend your knees so they are pointing toward the ceiling and your feet are flat on the floor. °12. Tighten your lower abdominal muscles to press your lower back against the floor. This motion will tilt your pelvis so your tailbone points up toward the ceiling instead of pointing to your feet or the floor. °13. With gentle tension and even breathing, hold this position for 5-10 seconds. °Cat-Cow °Repeat these steps until your lower back becomes more flexible: °7. Get into a hands-and-knees position on a firm surface. Keep your hands under your shoulders, and keep your knees under your hips. You may place padding under your knees for comfort. °8. Let your head hang down, and point your tailbone toward the floor so your lower back becomes rounded like the back of a cat. °9. Hold this position for 5 seconds. °10. Slowly lift your head and point your tailbone up toward the ceiling so your back forms a sagging arch like the back of a cow. °11. Hold this position for 5 seconds. °Press-Ups °Repeat these steps 5-10 times: °6. Lie on your abdomen (face-down) on the floor. °7. Place your palms near your head, about shoulder-width apart. °8. While you keep your back as relaxed as  possible and keep your hips on the floor, slowly straighten your arms to raise the top half of your body and lift your shoulders. Do not use your back muscles to raise your upper torso. You may adjust the placement of your hands to make yourself more comfortable. °9. Hold this position for 5 seconds while you keep your back relaxed. °10. Slowly return to lying flat on the floor. °Bridges °Repeat these steps 10 times: °1. Lie on your back on a firm surface. °2. Bend your knees so they are pointing toward the ceiling and your feet are flat on the floor. °3. Tighten your buttocks muscles and lift your buttocks off of the floor until your waist is at almost the same height as your knees. You should feel the muscles working in your buttocks and the back of your thighs. If you do not feel these muscles, slide your feet 1-2 inches farther away from your buttocks. °4. Hold this   position for 3-5 seconds. °5. Slowly lower your hips to the starting position, and allow your buttocks muscles to relax completely. °If this exercise is too easy, try doing it with your arms crossed over your chest. °Abdominal Crunches °Repeat these steps 5-10 times: °1. Lie on your back on a firm bed or the floor with your legs extended. °2. Bend your knees so they are pointing toward the ceiling and your feet are flat on the floor. °3. Cross your arms over your chest. °4. Tip your chin slightly toward your chest without bending your neck. °5. Tighten your abdominal muscles and slowly raise your trunk (torso) high enough to lift your shoulder blades a tiny bit off of the floor. Avoid raising your torso higher than that, because it can put too much stress on your low back and it does not help to strengthen your abdominal muscles. °6. Slowly return to your starting position. °Back Lifts °Repeat these steps 5-10 times: °1. Lie on your abdomen (face-down) with your arms at your sides, and rest your forehead on the floor. °2. Tighten the muscles in your  legs and your buttocks. °3. Slowly lift your chest off of the floor while you keep your hips pressed to the floor. Keep the back of your head in line with the curve in your back. Your eyes should be looking at the floor. °4. Hold this position for 3-5 seconds. °5. Slowly return to your starting position. °SEEK MEDICAL CARE IF: °· Your back pain or discomfort gets much worse when you do an exercise. °· Your back pain or discomfort does not lessen within 2 hours after you exercise. °If you have any of these problems, stop doing these exercises right away. Do not do them again unless your health care provider says that you can. °SEEK IMMEDIATE MEDICAL CARE IF: °· You develop sudden, severe back pain. If this happens, stop doing the exercises right away. Do not do them again unless your health care provider says that you can. °  °This information is not intended to replace advice given to you by your health care provider. Make sure you discuss any questions you have with your health care provider. °  °Document Released: 05/04/2004 Document Revised: 12/16/2014 Document Reviewed: 05/21/2014 °Elsevier Interactive Patient Education ©2016 Elsevier Inc. ° °

## 2015-02-08 NOTE — ED Provider Notes (Signed)
CSN: [id number]     Arrival date & time 02/08/15  1539 History  By signing my name below, I, Eustaquio Maize, attest that this documentation has been prepared under the direction and in the presence of Quintella Reichert, MD. Electronically Signed: Eustaquio Maize, ED Scribe. 02/08/2015. 4:31 PM.  Chief Complaint  Patient presents with  . Flank Pain   The history is provided by the patient. No language interpreter was used.     HPI Comments: [name] [name] is a 64 y.o. male who presents to the Emergency Department complaining of gradual onset, constant, right flank pain x 1 month. Pt states that the pain is exacerbated with movement and sitting up. He mentions that on 01/10/2015 (approximately 4 weeks ago) he fell off of the bed of his truck, landing onto his right side. He was seen in the ED on 01/12/2015 for right sided pain and had a CT A/P with findings of kidney stones. Pt has been seen by nephrology for the kidney stones and was told the kidney stones were not moving and should not be causing the pain. Denies fever, chills, nausea, vomiting, abdominal pain, loss of appetite, numbness, weakness, hematuria, dysuria, or any other associated symptoms.  Past Medical History  Diagnosis Date  . Nonischemic cardiomyopathy ([place])     [place] 06/2002 with normal cors; Myoview 11/08 neg for ischemia. Echo 4/08: EF of 35-40%.  Cause of NICM unknown.  Never a heavy drinker, used cocaine or amphetamines. HIV, SPEP, and ferritin/Fe studies were all negative in 12/09. Myoview (5/11): EF 42% with apical thinning but no evidence for ischemia or infarction. Echo (3/12): EF 45-50%, no significant valvular abnormalities.   . Hypertriglyceridemia   . Rotator cuff tendonitis     with hx of bilateral shoulder surgery  . HTN (hypertension)   . Knee osteoarthritis     s/p arhtroscopy 2010  . GERD (gastroesophageal reflux disease)     EGD 1/12 with erosive esophagitis and gastritis, biopsy postiive for H pylori (being   treated)  . Allergic rhinitis   . Gynecomastia     with spironolactone (ok on eplerenone)  . History of back surgery   . Diverticulosis     a. Colonoscopy (1/12) with mild diverticulosis and hemorrhoids  . Asthma     Probable mild intermittent asthma  . Elevated LFTs     HCV negative, possibly due to ETOH  . CKD (chronic kidney disease)    Past Surgical History  Procedure Laterality Date  . Ptca    . Stress cardiolite  12/15/05  . Transthoracic echocardiogram  07/13/06  . Electrocardiogram  09/20/06   Family History  Problem Relation Age of Onset  . Stroke Mother   . Prostate cancer Father   . Cardiomyopathy Brother     nonischemic, has icd   Social History  Substance Use Topics  . Smoking status: Never Smoker   . Smokeless tobacco: Never Used     Comment: denies   . Alcohol Use: 0.0 oz/week    0 Standard drinks or equivalent per week     Comment: rare    Review of Systems  Constitutional: Negative for fever, chills and appetite change.  Gastrointestinal: Negative for nausea, vomiting and abdominal pain.  Genitourinary: Positive for flank pain (Right). Negative for dysuria and hematuria.  Neurological: Negative for weakness and numbness.  All other systems reviewed and are negative.  Allergies  Review of patient's allergies indicates no known allergies.  Home Medications   Prior to  Admission medications   Medication Sig Start Date End Date Taking? Authorizing Provider  aspirin 325 MG tablet Take 325 mg by mouth daily.    Historical Provider, MD  atorvastatin (LIPITOR) 40 MG tablet Take 1 tablet (40 mg total) by mouth daily. 07/28/14   Larey Dresser, MD  carvedilol (COREG) 12.5 MG tablet Take 1 tablet (12.5 mg total) by mouth 2 (two) times daily. 01/23/14   Larey Dresser, MD  cyclobenzaprine (FLEXERIL) 10 MG tablet Take 1 tablet (10 mg total) by mouth at bedtime. 01/25/15   Dalia Heading, PA-C  eplerenone (INSPRA) 25 MG tablet Take 1 tablet (25 mg total) by  mouth daily. 06/17/14   Larey Dresser, MD  eszopiclone (LUNESTA) 2 MG TABS tablet Take 1 tablet (2 mg total) by mouth at bedtime as needed for sleep. Take immediately before bedtime 01/21/15   Rigoberto Noel, MD  fenofibrate 54 MG tablet Take 1 tablet (54 mg total) by mouth daily. 04/29/14   Brett Canales, PA-C  GINSENG PO Take 1 tablet by mouth 2 (two) times daily.    Historical Provider, MD  omega-3 acid ethyl esters (LOVAZA) 1 G capsule Take 2 g by mouth 2 (two) times daily.    Historical Provider, MD  oxyCODONE (ROXICODONE) 5 MG immediate release tablet Take 1 tablet (5 mg total) by mouth every 4 (four) hours as needed for severe pain. 01/12/15   Deno Etienne, DO  oxyCODONE-acetaminophen (PERCOCET/ROXICET) 5-325 MG tablet Take 1 tablet by mouth every 6 (six) hours as needed for severe pain. 01/25/15   Dalia Heading, PA-C  predniSONE (DELTASONE) 50 MG tablet Take 1 tablet (50 mg total) by mouth daily with breakfast. 01/25/15   Dalia Heading, PA-C  sacubitril-valsartan (ENTRESTO) 24-26 MG Take 1 tablet by mouth 2 (two) times daily. 12/21/14   Larey Dresser, MD   Triage Vitals: BP 107/66 mmHg  Pulse 69  Temp(Src) 98 F (36.7 C) (Oral)  Resp 18  Ht 5' 7.5" (1.715 m)  Wt 170 lb (77.111 kg)  BMI 26.22 kg/m2  SpO2 96%   Physical Exam  Constitutional: He is oriented to person, place, and time. He appears well-developed and well-nourished.  HENT:  Head: Normocephalic and atraumatic.  Cardiovascular: Normal rate and regular rhythm.   No murmur heard. Pulmonary/Chest: Effort normal and breath sounds normal. No respiratory distress.  Abdominal: Soft. There is no tenderness. There is no rebound and no guarding.  Musculoskeletal: He exhibits no edema or tenderness.  5/5 strength in BLEs No discrete back tenderness to palpation  Neurological: He is alert and oriented to person, place, and time.  Skin: Skin is warm and dry.  Psychiatric: He has a normal mood and affect. His behavior is  normal.  Nursing note and vitals reviewed.   ED Course  Procedures (including critical care time)  DIAGNOSTIC STUDIES: Oxygen Saturation is 96% on RA, normal by my interpretation.    COORDINATION OF CARE: 4:29 PM-Discussed treatment plan which includes Toradol injection and referral to orthopedist with pt at bedside and pt agreed to plan.   Labs Review Labs Reviewed - No data to display  Imaging Review No results found.   EKG Interpretation None      MDM   Final diagnoses:  Right-sided low back pain without sciatica   Patient here for evaluation of right side pain. 2 recent ED evaluations for similar symptoms last month. Pain is reproducible with activity, range of motion. Presentation is not consistent with renal colic,  UTI. Pain is previously improved with Toradol, providing one time injection in the department. Discussed home care for musculoskeletal back pain with outpatient follow-up and return precautions.  I personally performed the services described in this documentation, which was scribed in my presence. The recorded information has been reviewed and is accurate.     Quintella Reichert, MD 02/09/15 [phone]

## 2015-02-08 NOTE — ED Notes (Signed)
Right flank pain for over one month.  Pt saw nephology and was told the kidney stones were not moving and were not the problem.

## 2015-02-09 ENCOUNTER — Encounter: Payer: Self-pay | Admitting: Pulmonary Disease

## 2015-02-12 ENCOUNTER — Other Ambulatory Visit: Payer: Self-pay | Admitting: Specialist

## 2015-02-12 DIAGNOSIS — M545 Low back pain: Secondary | ICD-10-CM

## 2015-02-18 ENCOUNTER — Encounter (HOSPITAL_COMMUNITY): Payer: Medicare Other

## 2015-02-25 ENCOUNTER — Ambulatory Visit
Admission: RE | Admit: 2015-02-25 | Discharge: 2015-02-25 | Disposition: A | Discharge status: Home / Self Care | Payer: Medicare Other | Source: Ambulatory Visit | Attending: Specialist | Admitting: Specialist

## 2015-02-25 DIAGNOSIS — M545 Low back pain: Secondary | ICD-10-CM

## 2015-02-25 MED ORDER — GADOBENATE DIMEGLUMINE 529 MG/ML IV SOLN
16.0000 mL | Freq: Once | INTRAVENOUS | Status: AC | PRN
  Administered 2015-02-25: 16 mL via INTRAVENOUS

## 2015-03-01 ENCOUNTER — Other Ambulatory Visit: Payer: Self-pay | Admitting: Cardiology

## 2015-03-01 MED ORDER — CARVEDILOL 12.5 MG PO TABS: 12.5000 mg | ORAL_TABLET | Freq: Two times a day (BID) | ORAL | Status: DC

## 2015-03-01 NOTE — Telephone Encounter (Signed)
Rx for Coreg sent to pharmacy on record

## 2015-03-12 ENCOUNTER — Encounter (HOSPITAL_COMMUNITY): Payer: Medicare Other

## 2015-03-22 ENCOUNTER — Encounter (HOSPITAL_COMMUNITY): Payer: Medicare Other

## 2015-04-06 ENCOUNTER — Other Ambulatory Visit (HOSPITAL_COMMUNITY): Payer: Self-pay

## 2015-04-19 ENCOUNTER — Telehealth (HOSPITAL_COMMUNITY): Payer: Self-pay | Admitting: *Deleted

## 2015-04-19 ENCOUNTER — Encounter (HOSPITAL_COMMUNITY): Payer: Medicare Other

## 2015-04-19 NOTE — Telephone Encounter (Signed)
Pt called to let us know he needed [place] to order his Inspra from [employer].  I called [place] at [phone], pt's ID [id number] and ordered, confirmation # [id number].  Pt aware we will let him know when me arrives

## 2015-04-23 ENCOUNTER — Telehealth (HOSPITAL_COMMUNITY): Payer: Self-pay | Admitting: *Deleted

## 2015-04-23 NOTE — Telephone Encounter (Signed)
Received pt's Inspra from [employer], he is aware and will p/u today

## 2015-04-27 ENCOUNTER — Other Ambulatory Visit (HOSPITAL_COMMUNITY): Payer: Self-pay | Admitting: Cardiology

## 2015-04-28 ENCOUNTER — Other Ambulatory Visit: Payer: Self-pay | Admitting: Specialist

## 2015-04-28 DIAGNOSIS — M545 Low back pain: Secondary | ICD-10-CM

## 2015-04-29 ENCOUNTER — Other Ambulatory Visit (HOSPITAL_COMMUNITY): Payer: Self-pay | Admitting: *Deleted

## 2015-05-03 ENCOUNTER — Ambulatory Visit
Admission: RE | Admit: 2015-05-03 | Discharge: 2015-05-03 | Disposition: A | Discharge status: Home / Self Care | Payer: Medicare Other | Source: Ambulatory Visit | Attending: Specialist | Admitting: Specialist

## 2015-05-03 DIAGNOSIS — M545 Low back pain: Secondary | ICD-10-CM

## 2015-05-03 MED ORDER — GADOBENATE DIMEGLUMINE 529 MG/ML IV SOLN
15.0000 mL | Freq: Once | INTRAVENOUS | Status: AC | PRN
  Administered 2015-05-03: 15 mL via INTRAVENOUS

## 2015-05-10 ENCOUNTER — Encounter (HOSPITAL_COMMUNITY): Payer: Self-pay

## 2015-05-10 ENCOUNTER — Ambulatory Visit (HOSPITAL_COMMUNITY)
Admission: RE | Admit: 2015-05-10 | Discharge: 2015-05-10 | Disposition: A | Discharge status: Home / Self Care | Payer: Medicare Other | Source: Ambulatory Visit | Attending: Cardiology | Admitting: Cardiology

## 2015-05-10 VITALS — BP 102/50 | HR 68 | Wt 175.8 lb

## 2015-05-10 DIAGNOSIS — E785 Hyperlipidemia, unspecified: Secondary | ICD-10-CM | POA: Insufficient documentation

## 2015-05-10 DIAGNOSIS — Z8042 Family history of malignant neoplasm of prostate: Secondary | ICD-10-CM | POA: Diagnosis not present

## 2015-05-10 DIAGNOSIS — K219 Gastro-esophageal reflux disease without esophagitis: Secondary | ICD-10-CM | POA: Diagnosis not present

## 2015-05-10 DIAGNOSIS — Z823 Family history of stroke: Secondary | ICD-10-CM | POA: Insufficient documentation

## 2015-05-10 DIAGNOSIS — I130 Hypertensive heart and chronic kidney disease with heart failure and stage 1 through stage 4 chronic kidney disease, or unspecified chronic kidney disease: Secondary | ICD-10-CM | POA: Insufficient documentation

## 2015-05-10 DIAGNOSIS — Z7982 Long term (current) use of aspirin: Secondary | ICD-10-CM | POA: Diagnosis not present

## 2015-05-10 DIAGNOSIS — E781 Pure hyperglyceridemia: Secondary | ICD-10-CM | POA: Insufficient documentation

## 2015-05-10 DIAGNOSIS — I428 Other cardiomyopathies: Secondary | ICD-10-CM | POA: Diagnosis not present

## 2015-05-10 DIAGNOSIS — Z79899 Other long term (current) drug therapy: Secondary | ICD-10-CM | POA: Insufficient documentation

## 2015-05-10 DIAGNOSIS — I5022 Chronic systolic (congestive) heart failure: Secondary | ICD-10-CM | POA: Diagnosis not present

## 2015-05-10 DIAGNOSIS — N189 Chronic kidney disease, unspecified: Secondary | ICD-10-CM | POA: Diagnosis not present

## 2015-05-10 DIAGNOSIS — G4733 Obstructive sleep apnea (adult) (pediatric): Secondary | ICD-10-CM | POA: Insufficient documentation

## 2015-05-10 DIAGNOSIS — Z8249 Family history of ischemic heart disease and other diseases of the circulatory system: Secondary | ICD-10-CM | POA: Insufficient documentation

## 2015-05-10 LAB — BASIC METABOLIC PANEL
ANION GAP: 11 (ref 5–15)
BUN: 13 mg/dL (ref 6–20)
CALCIUM: 9 mg/dL (ref 8.9–10.3)
CO2: 24 mmol/L (ref 22–32)
Chloride: 105 mmol/L (ref 101–111)
Creatinine, Ser: 0.96 mg/dL (ref 0.61–1.24)
GFR calc Af Amer: >60 mL/min (ref >60)
GFR calc non Af Amer: >60 mL/min (ref >60)
GLUCOSE: 112 mg/dL — AB (ref 65–99)
Potassium: 4.6 mmol/L (ref 3.5–5.1)
Sodium: 140 mmol/L (ref 135–145)

## 2015-05-10 LAB — BRAIN NATRIURETIC PEPTIDE: B Natriuretic Peptide: 8.9 pg/mL (ref 0.0–100.0)

## 2015-05-10 MED ORDER — ASPIRIN 81 MG PO TABS: 81.0000 mg | ORAL_TABLET | Freq: Every day | ORAL | Status: DC

## 2015-05-10 NOTE — Progress Notes (Signed)
Patient ID: [name] [name], male   DOB: 11/30/1950, 65 y.o.   MRN: [id number] Pulmonary: Dr Elsworth Soho PCP: none  Cardiology: Dr. Aundra Dubin  65 yo with history of nonischemic cardiomyopathy presents for followup.  No complaints from a cardiac standpoint.  No exertional dyspnea.  He goes dancing and works out in the gym 3-4 times/week without problems.  No orthopnea/PND.  No chest pain.  No lightheadedness or palpitations.  Broke up with girlfriend recently.  Not able to tolerate CPAP.  Says he is drinking only on the weekend.  Labs (12/09): SPEP negative, HIV negative, transferrin saturation 29%, BNP 24 Labs (9/10): direct LDL 54, HDL 13.8, triglycerides 1470, creatinine 0.8 Labs (05/11/09): BNP 27, WBCs 5.6 Labs (5/11): K 4.6, creatinine 1.2, TGs 238, LDL 108, HDL 34 Labs (8/11): K 4.3, creatinine 1.4, BNP 12.6 Labs (2/12): K 4.1, creatinine 1.7, LDL 102 Labs (8/12): K 4.7, creatinine 1.5 Labs (4/13): LDL 132, HDL 33.4, AST 41, ALT 63 Labs (7/13): K 4, creatinine 1.2, ALT 59, AST 36, HCV negative Labs (1/14): K 4.1, creatinine 1.5 Labs (04/24/14): Cholesterol 222, TGL 265, HDL 37, LDL 169 Labs (4/16): LDL 110, HDl 38 Labs (9/16): LDL 116, HDL 36, TGs 175 Labs (01/2015): K 3.8 Creatinine 1.2 => 1.08, HCT 37  Allergies (verified):  No Known Drug Allergies  Past Medical History: 1. Nonischemic cardiomyopathy.  The patient had a left heart catheterization done in March 2004 with normal coronaries and then in November 2008, he had a Myoview done that showed an EF of 39% with no ischemia.  Echo in April 2008 showed an EF of 35-40%, moderate diffuse hypokinesis, mild left atrial enlargement, normal RV size and function and essentially normal valves.  The cause of his cardiomyopathy has not been discovered. He drinks but not extremely heavily.  He has never used cocaine and amphetamines.  HIV, SPEP, and ferritin/Fe studies were all negative in 12/09.  Echo (9/10): EF 40%, mild to moderate global HK, mild LAE.   Myoview (5/11): EF 42% with apical thinning but no evidence for ischemia or infarction.  Echo (3/12): EF 45-50%, no significant valvular abnormalities. Echo (1/16) with EF 35-40%, diffuse hypokinesis.  2. Hypertriglyceridemia 3. Rotator cuff tendonitis with a history of bilateral shoulder surgery. 4. Hypertension. 5. History of a back surgery. 6. Knee osteoarthritis, s/p arthroscopy 2010 7. Gastroesophageal reflux disease. EGD (1/12) with erosive esophagitis and gastritis, biopsy positive for H pylori (being treated). 8. Colonoscopy (1/12) with mild diverticulosis and hemorrhoids.  9. Allergic rhinitis 10. Gynecomastia with spironolactone (ok on eplerenone).  11. Probable mild intermittent asthma 12. Elevated LFTs: HCV negative, possibly due to ETOH 13. CKD 14. OSA: CPAP.   Family History: No premature CAD.  Father with prostate cancer Mother with CVA Brother with nonischemic cardiomyopathy, has ICD Grandmother with cancer (unsure what)    Social History: The patient denies smoking.  Lives in [place].  He does not use any drugs. He has never used any illicit drugs other than marijuana, which he smoked occasionally in the distant past.  Moderate ETOH.He is on disability. He is divorced with two sons.     ROS:  All systems reviewed and negative except as per HPI.    Current Outpatient Prescriptions  Medication Sig Dispense Refill  . aspirin 81 MG tablet Take 1 tablet (81 mg total) by mouth daily.    Marland Kitchen atorvastatin (LIPITOR) 40 MG tablet Take 1 tablet (40 mg total) by mouth daily. 90 tablet 3  . carvedilol (COREG)  12.5 MG tablet Take 1 tablet (12.5 mg total) by mouth 2 (two) times daily. 180 tablet 3  . cyclobenzaprine (FLEXERIL) 10 MG tablet Take 1 tablet (10 mg total) by mouth 2 (two) times daily as needed for muscle spasms. 12 tablet 0  . ENTRESTO 24-26 MG TAKE ONE TABLET BY MOUTH TWICE DAILY 60 tablet 3  . eplerenone (INSPRA) 25 MG tablet Take 1 tablet (25 mg total) by mouth  daily. 30 tablet 0  . eszopiclone (LUNESTA) 2 MG TABS tablet Take 1 tablet (2 mg total) by mouth at bedtime as needed for sleep. Take immediately before bedtime 30 tablet 0  . fenofibrate 54 MG tablet Take 1 tablet (54 mg total) by mouth daily. 90 tablet 3  . GINSENG PO Take 1 tablet by mouth 2 (two) times daily.    Marland Kitchen lidocaine (LIDODERM) 5 % Place 1 patch onto the skin daily. Remove & Discard patch within 12 hours or as directed by MD 12 patch 0  . omega-3 acid ethyl esters (LOVAZA) 1 G capsule Take 2 g by mouth 2 (two) times daily.    Marland Kitchen oxyCODONE-acetaminophen (PERCOCET/ROXICET) 5-325 MG tablet Take 1 tablet by mouth every 6 (six) hours as needed for severe pain. 20 tablet 0   No current facility-administered medications for this encounter.    BP 102/50 mmHg  Pulse 68  Wt 175 lb 12 oz (79.72 kg)  SpO2 98% General: NAD Neck: No JVD, no thyromegaly or thyroid nodule.  Lungs: Clear to auscultation bilaterally with normal respiratory effort. CV: Nondisplaced PMI.  Heart regular S1/S2, no S3/S4, no murmur.  No peripheral edema.  No carotid bruit.  Normal pedal pulses.  Abdomen: Soft, nontender, no hepatosplenomegaly, no distention.  Neurologic: Alert and oriented x 3.  Psych: Normal affect. Extremities: No clubbing or cyanosis.   Assessment/Plan:  Chronic systolic heart failure  Nonischemic cardiomyopathy, most recent ECHO 04/2014 with EF down to 35-40%. No heavy substance abuse (though occasionally binge drinks), HIV negative, SPEP negative, no evidence for hemochromatosis. This may be familial as his brother apparently also has a nonischemic cardiomyopathy and got an ICD.  NYHA I-II. Volume status stable. Not currently on lasix.  - I will arrange for cardiac MRI to reassess EF and to assess for infiltrative disease or prior myocarditis.  - Continue Inspra 25 mg daily.  - Continue carvedilol 12.5 mg twice a day.  - Continue Entresto 24/26 bid. Will not increase with soft BP  - BMET/BNP  today.   - I encouraged him to limit ETOH.  Hyperlipidemia Continue atorvastatin, lipids ok.  OSA Unable to tolerate CPAP.   OK to decrease ASA to 81 mg daily.    Followup in 4 months.   Loralie Champagne 05/10/2015

## 2015-05-10 NOTE — Patient Instructions (Signed)
Decrease Aspirin to 81 mg daily  Labs today  Your physician has requested that you have a cardiac MRI. Cardiac MRI uses a computer to create images of your heart as its beating, producing both still and moving pictures of your heart and major blood vessels. For further information please visit [email or web address] Please follow the instruction sheet given to you today for more information.  ONCE APPROVED THROUGH YOUR INSURANCE RADIOLOGY WILL CALL YOU TO SCHEDULE  We will contact you in 4 months to schedule your next appointment.

## 2015-05-26 ENCOUNTER — Telehealth (HOSPITAL_COMMUNITY): Payer: Self-pay | Admitting: Vascular Surgery

## 2015-05-26 NOTE — Telephone Encounter (Signed)
Please call pt about Cardiac MRI.Marland Kitchen PLEASE ADVISE

## 2015-05-26 NOTE — Telephone Encounter (Signed)
Wonder if his BP is getting too low.  Have him check it at home if he can.  Have him see [name] [name] in clinic next week.  Get MRI scheduled.

## 2015-05-26 NOTE — Telephone Encounter (Signed)
Returned call to patient. Patient states for past week he has been experiencing "spells" of nausea, dizziness, feeling flushed worsening with exertion that makes him have to sit or lie down until it passes.  Denies CP, but says at times he has a pinching/stabbing [name] in upper middle back. cMRI ordered and pending scheduling as it was due closer to his 4 month follow up with CHF clinic (May). Also waiting to schedule back surgery in a few weeks with Dr. Deno Etienne at [facility] for ruptured disk. Will forward to Dr. Aundra Dubin who recently saw patient in CHF clinic for further instruction.  [name] [name]

## 2015-06-04 ENCOUNTER — Telehealth (HOSPITAL_COMMUNITY): Payer: Self-pay

## 2015-06-04 ENCOUNTER — Telehealth (HOSPITAL_COMMUNITY): Payer: Self-pay | Admitting: *Deleted

## 2015-06-04 NOTE — Telephone Encounter (Signed)
precert for cardiac mri  faxed to [facility].

## 2015-06-04 NOTE — Telephone Encounter (Signed)
Per Dr. Aundra Dubin, pending expedited cMRI for recent s/s as reported in previous phone note.

## 2015-06-08 ENCOUNTER — Telehealth: Payer: Self-pay | Admitting: Cardiology

## 2015-06-08 ENCOUNTER — Encounter: Payer: Self-pay | Admitting: Cardiology

## 2015-06-08 NOTE — Telephone Encounter (Signed)
Called and LVM regarding cardiac MRI on 06-25-15.  Letter mailed to the patient.

## 2015-06-23 ENCOUNTER — Telehealth (HOSPITAL_COMMUNITY): Payer: Self-pay

## 2015-06-23 NOTE — Telephone Encounter (Signed)
LMOMTCB. Patient has had "spells" of dizziness, nausea, flushing, etc with exertion causing him to have to lay down for episodes to subside for a few weeks now.  cMRI per Dr. Aundra Dubin is scheduled for this Friday 06/25/15.  Called to get patient scheduled for f/u with Aundra Dubin to discuss results, be evaluated, and discuss plan of care soon after this cMRI, no answer, left message to call back.  Renee Pain

## 2015-06-24 NOTE — Telephone Encounter (Signed)
Pt called back and I added him to the schedule for 3/21 at 3:20 pm

## 2015-06-25 ENCOUNTER — Ambulatory Visit (HOSPITAL_COMMUNITY)
Admission: RE | Admit: 2015-06-25 | Discharge: 2015-06-25 | Disposition: A | Discharge status: Home / Self Care | Payer: Medicare Other | Source: Ambulatory Visit | Attending: Cardiology | Admitting: Cardiology

## 2015-06-25 DIAGNOSIS — I5022 Chronic systolic (congestive) heart failure: Secondary | ICD-10-CM | POA: Insufficient documentation

## 2015-06-25 DIAGNOSIS — I429 Cardiomyopathy, unspecified: Secondary | ICD-10-CM | POA: Diagnosis not present

## 2015-06-25 LAB — CREATININE, SERUM: CREATININE: 1.07 mg/dL (ref 0.61–1.24)

## 2015-06-25 MED ORDER — GADOBENATE DIMEGLUMINE 529 MG/ML IV SOLN
26.0000 mL | Freq: Once | INTRAVENOUS | Status: AC | PRN
  Administered 2015-06-25: 20 mL via INTRAVENOUS

## 2015-06-29 ENCOUNTER — Encounter (HOSPITAL_COMMUNITY): Payer: Self-pay

## 2015-06-29 ENCOUNTER — Ambulatory Visit (HOSPITAL_COMMUNITY)
Admission: RE | Admit: 2015-06-29 | Discharge: 2015-06-29 | Disposition: A | Discharge status: Home / Self Care | Payer: Medicare Other | Source: Ambulatory Visit | Attending: Cardiology | Admitting: Cardiology

## 2015-06-29 VITALS — BP 122/72 | HR 71 | Wt 171.0 lb

## 2015-06-29 DIAGNOSIS — I5022 Chronic systolic (congestive) heart failure: Secondary | ICD-10-CM | POA: Diagnosis not present

## 2015-06-29 NOTE — Patient Instructions (Signed)
Follow up in 4 months with Dr.McLean

## 2015-06-29 NOTE — Progress Notes (Signed)
Patient ID: [name] [name], male   DOB: January 06, 1951, 65 y.o.   MRN: [id number] Pulmonary: Dr Elsworth Soho PCP: none  Cardiology: Dr. Aundra Dubin  65 yo with history of nonischemic cardiomyopathy presents for followup.  No complaints from a cardiac standpoint.  No exertional dyspnea.  He goes dancing and works out in the gym 3-4 times/week without problems.  No orthopnea/PND.  No chest pain.  No palpitations.  Not able to tolerate CPAP.  Says he is drinking only on the weekend.  Occasional mild lightheadedness with standing.    He has developed back pain and was found to have a herniated disc that apparently needs surgical repair.   Labs (12/09): SPEP negative, HIV negative, transferrin saturation 29%, BNP 24 Labs (9/10): direct LDL 54, HDL 13.8, triglycerides 1470, creatinine 0.8 Labs (05/11/09): BNP 27, WBCs 5.6 Labs (5/11): K 4.6, creatinine 1.2, TGs 238, LDL 108, HDL 34 Labs (8/11): K 4.3, creatinine 1.4, BNP 12.6 Labs (2/12): K 4.1, creatinine 1.7, LDL 102 Labs (8/12): K 4.7, creatinine 1.5 Labs (4/13): LDL 132, HDL 33.4, AST 41, ALT 63 Labs (7/13): K 4, creatinine 1.2, ALT 59, AST 36, HCV negative Labs (1/14): K 4.1, creatinine 1.5 Labs (04/24/14): Cholesterol 222, TGL 265, HDL 37, LDL 169 Labs (4/16): LDL 110, HDl 38 Labs (9/16): LDL 116, HDL 36, TGs 175 Labs (01/2015): K 3.8 Creatinine 1.2 => 1.08, HCT 37 Labs (3/17): creatinine 1.07, BNP 8.9  Allergies (verified):  No Known Drug Allergies  Past Medical History: 1. Nonischemic cardiomyopathy.  The patient had a left heart catheterization done in March 2004 with normal coronaries and then in November 2008, he had a Myoview done that showed an EF of 39% with no ischemia.  Echo in April 2008 showed an EF of 35-40%, moderate diffuse hypokinesis, mild left atrial enlargement, normal RV size and function and essentially normal valves.  The cause of his cardiomyopathy has not been discovered. He drinks but not extremely heavily.  He has never used cocaine  and amphetamines.  HIV, SPEP, and ferritin/Fe studies were all negative in 12/09.  Echo (9/10): EF 40%, mild to moderate global HK, mild LAE.  Myoview (5/11): EF 42% with apical thinning but no evidence for ischemia or infarction.  Echo (3/12): EF 45-50%, no significant valvular abnormalities. Echo (1/16) with EF 35-40%, diffuse hypokinesis.  - Cardiac MRI (3/17) with EF 43%, mild diffuse hypokinesis, mid-wall LGE in the mid septum, possible prior viral myocarditis.  2. Hypertriglyceridemia 3. Rotator cuff tendonitis with a history of bilateral shoulder surgery. 4. Hypertension. 5. History of a back surgery. 6. Knee osteoarthritis, s/p arthroscopy 2010 7. Gastroesophageal reflux disease. EGD (1/12) with erosive esophagitis and gastritis, biopsy positive for H pylori (being treated). 8. Colonoscopy (1/12) with mild diverticulosis and hemorrhoids.  9. Allergic rhinitis 10. Gynecomastia with spironolactone (ok on eplerenone).  11. Probable mild intermittent asthma 12. Elevated LFTs: HCV negative, possibly due to ETOH 13. CKD 14. OSA: CPAP.  15. H/o herniated nucleus pulposus.    Family History: No premature CAD.  Father with prostate cancer Mother with CVA Brother with nonischemic cardiomyopathy, has ICD Grandmother with cancer (unsure what)    Social History: The patient denies smoking.  Lives in [place].  He does not use any drugs. He has never used any illicit drugs other than marijuana, which he smoked occasionally in the distant past.  Moderate ETOH.He is on disability. He is divorced with two sons.     ROS:  All systems reviewed and negative except  as per HPI.    Current Outpatient Prescriptions  Medication Sig Dispense Refill  . aspirin 81 MG tablet Take 1 tablet (81 mg total) by mouth daily.    Marland Kitchen atorvastatin (LIPITOR) 40 MG tablet Take 1 tablet (40 mg total) by mouth daily. 90 tablet 3  . carvedilol (COREG) 12.5 MG tablet Take 1 tablet (12.5 mg total) by mouth 2 (two)  times daily. 180 tablet 3  . cyclobenzaprine (FLEXERIL) 10 MG tablet Take 1 tablet (10 mg total) by mouth 2 (two) times daily as needed for muscle spasms. 12 tablet 0  . ENTRESTO 24-26 MG TAKE ONE TABLET BY MOUTH TWICE DAILY 60 tablet 3  . eplerenone (INSPRA) 25 MG tablet Take 1 tablet (25 mg total) by mouth daily. 30 tablet 0  . eszopiclone (LUNESTA) 2 MG TABS tablet Take 1 tablet (2 mg total) by mouth at bedtime as needed for sleep. Take immediately before bedtime 30 tablet 0  . fenofibrate 54 MG tablet Take 1 tablet (54 mg total) by mouth daily. 90 tablet 3  . GINSENG PO Take 1 tablet by mouth 2 (two) times daily.    Marland Kitchen lidocaine (LIDODERM) 5 % Place 1 patch onto the skin daily. Remove & Discard patch within 12 hours or as directed by MD 12 patch 0  . omega-3 acid ethyl esters (LOVAZA) 1 G capsule Take 2 g by mouth 2 (two) times daily.    Marland Kitchen oxyCODONE-acetaminophen (PERCOCET/ROXICET) 5-325 MG tablet Take 1 tablet by mouth every 6 (six) hours as needed for severe pain. 20 tablet 0   No current facility-administered medications for this encounter.    BP 122/72 mmHg  Pulse 71  Wt 171 lb (77.565 kg)  SpO2 99% General: NAD Neck: No JVD, no thyromegaly or thyroid nodule.  Lungs: Clear to auscultation bilaterally with normal respiratory effort. CV: Nondisplaced PMI.  Heart regular S1/S2, no S3/S4, no murmur.  No peripheral edema.  No carotid bruit.  Normal pedal pulses.  Abdomen: Soft, nontender, no hepatosplenomegaly, no distention.  Neurologic: Alert and oriented x 3.  Psych: Normal affect. Extremities: No clubbing or cyanosis.   Assessment/Plan:  Chronic systolic heart failure  Nonischemic cardiomyopathy, most recent ECHO 04/2014 with EF down to 35-40%.  Cardiac MRI in 3/17, however, showed EF 43% with mid-wall pattern of LGE in the septum that may be suggestive of prior myocarditis.  No heavy substance abuse (though occasionally binge drinks), HIV negative, SPEP negative, no evidence for  hemochromatosis. Would also consider familial cardiomyopathy as his brother apparently also has a nonischemic cardiomyopathy and got an ICD.  NYHA I-II. Volume status stable. Not currently on lasix.  - EF 43% on cardiac MRI in 3/17, out of range for ICD.  - Continue Inspra 25 mg daily.  - Continue carvedilol 12.5 mg twice a day.  - Continue Entresto 24/26 bid. Will not increase with occasional lightheaded episodes.   - I encouraged him to limit ETOH.  Hyperlipidemia Continue atorvastatin, lipids ok.  OSA Unable to tolerate CPAP.  Preoperative evaluation I think that he would not be at excessive risk from back surgery.  If he needs the operation, would be ok to proceed.  He should get his Coreg peri-operatively.   Followup in 4 months.   Loralie Champagne 06/29/2015

## 2015-07-21 ENCOUNTER — Other Ambulatory Visit: Payer: Self-pay | Admitting: Physician Assistant

## 2015-07-22 ENCOUNTER — Inpatient Hospital Stay (HOSPITAL_COMMUNITY): Admission: RE | Admit: 2015-07-22 | Payer: Medicare Other | Source: Ambulatory Visit

## 2015-07-26 ENCOUNTER — Other Ambulatory Visit (HOSPITAL_COMMUNITY): Payer: Self-pay | Admitting: *Deleted

## 2015-07-26 ENCOUNTER — Telehealth (HOSPITAL_COMMUNITY): Payer: Self-pay | Admitting: *Deleted

## 2015-07-26 MED ORDER — FENOFIBRATE 54 MG PO TABS: 54.0000 mg | ORAL_TABLET | Freq: Every day | ORAL | Status: DC

## 2015-07-26 NOTE — Telephone Encounter (Signed)
Pt called to request we order his Inspra from [employer].  [place] at [phone], pt's ID [id number] and ordered med, order # [id number]

## 2015-07-28 ENCOUNTER — Inpatient Hospital Stay (HOSPITAL_COMMUNITY)
Admission: RE | Admit: 2015-07-28 | Discharge: 2015-07-28 | Disposition: A | Discharge status: Home / Self Care | Payer: Medicare Other | Source: Ambulatory Visit

## 2015-07-28 ENCOUNTER — Other Ambulatory Visit (HOSPITAL_COMMUNITY): Payer: Self-pay | Admitting: *Deleted

## 2015-07-28 NOTE — Pre-Procedure Instructions (Signed)
    [name] [name]  07/28/2015      [id number] - [place], [place] - [address] [place] [address] Phone: [phone] Fax: [phone]  [facility] [place], [place] - [id number] [place] [id number] [place] [address] [place] [address] Phone: [phone] Fax: [phone]  [facility] [place], [place] [place] [place] [address] Phone: [phone] Fax: [phone]    Your procedure is scheduled on 07-30-2015   Friday    Report to [facility] Admitting at 10:30 A.M.   Call this number if you have problems the morning of surgery:  [phone]   Remember:  Do not eat food or drink liquids after midnight.   Take these medicines the morning of surgery with A SIP OF WATER Atorvastatin(Lipitor),carvedilol(Coreg),cyclobenzaprine(Flexeril)if needed,Entresto,fenofibrate,pain medication if needed   Do not wear jewelry.  Do not wear lotions, powders, or perfumes.  You may not wear deodorant.  Do not shave 48 hours prior to surgery.  Men may shave face and neck.   Do not bring valuables to the hospital.  [facility] is not responsible for any belongings or valuables.  Contacts, dentures or bridgework may not be worn into surgery.  Leave your suitcase in the car.  After surgery it may be brought to your room.  For patients admitted to the hospital, discharge time will be determined by your treatment team.  Patients discharged the day of surgery will not be allowed to drive home.    Special instructions:  See attached Sheet for instructions on CHG showers  Please read over the following fact sheets that you were given. Pain Booklet and Surgical Site Infection Prevention

## 2015-07-30 ENCOUNTER — Ambulatory Visit (HOSPITAL_COMMUNITY): Admission: RE | Admit: 2015-07-30 | Payer: Medicare Other | Source: Ambulatory Visit | Admitting: Specialist

## 2015-07-30 ENCOUNTER — Encounter (HOSPITAL_COMMUNITY): Admission: RE | Payer: Self-pay | Source: Ambulatory Visit

## 2015-07-30 SURGERY — LUMBAR LAMINECTOMY/DECOMPRESSION MICRODISCECTOMY
Anesthesia: General | Laterality: Right

## 2015-08-03 ENCOUNTER — Other Ambulatory Visit: Payer: Self-pay | Admitting: Physician Assistant

## 2015-08-05 ENCOUNTER — Telehealth (HOSPITAL_COMMUNITY): Payer: Self-pay | Admitting: Pharmacist

## 2015-08-05 NOTE — Telephone Encounter (Signed)
Received #3 bottles of Inspra 25 mg tablets from [place] for patient. Have called and left VM for patient to pick up at his convenience.   Ruta Hinds. Velva Harman, PharmD, BCPS, CPP Clinical Pharmacist Pager: [phone] Phone: [phone] 08/05/2015 2:00 PM

## 2015-09-03 ENCOUNTER — Other Ambulatory Visit: Payer: Self-pay | Admitting: Specialist

## 2015-09-06 ENCOUNTER — Other Ambulatory Visit (HOSPITAL_COMMUNITY): Payer: Self-pay | Admitting: Adult Health

## 2015-09-07 ENCOUNTER — Other Ambulatory Visit (HOSPITAL_COMMUNITY): Payer: Self-pay | Admitting: *Deleted

## 2015-09-07 MED ORDER — SACUBITRIL-VALSARTAN 24-26 MG PO TABS: 1.0000 | ORAL_TABLET | Freq: Two times a day (BID) | ORAL | Status: DC

## 2015-09-13 ENCOUNTER — Ambulatory Visit (HOSPITAL_COMMUNITY)
Admission: RE | Admit: 2015-09-13 | Discharge: 2015-09-13 | Disposition: A | Discharge status: Home / Self Care | Payer: Medicare Other | Source: Ambulatory Visit | Attending: Surgery | Admitting: Surgery

## 2015-09-13 ENCOUNTER — Encounter (HOSPITAL_COMMUNITY)
Admission: RE | Admit: 2015-09-13 | Discharge: 2015-09-13 | Disposition: A | Discharge status: Home / Self Care | Payer: Medicare Other | Source: Ambulatory Visit | Attending: Specialist | Admitting: Specialist

## 2015-09-13 ENCOUNTER — Encounter (HOSPITAL_COMMUNITY): Payer: Self-pay

## 2015-09-13 DIAGNOSIS — M67431 Ganglion, right wrist: Secondary | ICD-10-CM | POA: Diagnosis not present

## 2015-09-13 DIAGNOSIS — I10 Essential (primary) hypertension: Secondary | ICD-10-CM | POA: Insufficient documentation

## 2015-09-13 DIAGNOSIS — Z01818 Encounter for other preprocedural examination: Secondary | ICD-10-CM | POA: Insufficient documentation

## 2015-09-13 DIAGNOSIS — I498 Other specified cardiac arrhythmias: Secondary | ICD-10-CM | POA: Diagnosis not present

## 2015-09-13 DIAGNOSIS — Z01812 Encounter for preprocedural laboratory examination: Secondary | ICD-10-CM | POA: Insufficient documentation

## 2015-09-13 DIAGNOSIS — Z0183 Encounter for blood typing: Secondary | ICD-10-CM | POA: Insufficient documentation

## 2015-09-13 DIAGNOSIS — M5126 Other intervertebral disc displacement, lumbar region: Secondary | ICD-10-CM | POA: Diagnosis not present

## 2015-09-13 HISTORY — DX: Anxiety disorder, unspecified: F41.9

## 2015-09-13 HISTORY — DX: Sleep apnea, unspecified: G47.30

## 2015-09-13 LAB — CBC WITH DIFFERENTIAL/PLATELET
Basophils Absolute: 0 10*3/uL (ref 0.0–0.1)
Basophils Relative: 1 %
EOS PCT: 3 %
Eosinophils Absolute: 0.2 10*3/uL (ref 0.0–0.7)
HEMATOCRIT: 42.9 % (ref 39.0–52.0)
Hemoglobin: 14.7 g/dL (ref 13.0–17.0)
LYMPHS PCT: 30 %
Lymphs Abs: 1.8 10*3/uL (ref 0.7–4.0)
MCH: 30.2 pg (ref 26.0–34.0)
MCHC: 34.3 g/dL (ref 30.0–36.0)
MCV: 88.1 fL (ref 78.0–100.0)
MONO ABS: 0.4 10*3/uL (ref 0.1–1.0)
MONOS PCT: 7 %
NEUTROS ABS: 3.6 10*3/uL (ref 1.7–7.7)
Neutrophils Relative %: 59 %
Platelets: 169 10*3/uL (ref 150–400)
RBC: 4.87 MIL/uL (ref 4.22–5.81)
RDW: 13.2 % (ref 11.5–15.5)
WBC: 6 10*3/uL (ref 4.0–10.5)

## 2015-09-13 LAB — COMPREHENSIVE METABOLIC PANEL
ALT: 20 U/L (ref 17–63)
ANION GAP: 6 (ref 5–15)
AST: 19 U/L (ref 15–41)
Albumin: 4 g/dL (ref 3.5–5.0)
Alkaline Phosphatase: 70 U/L (ref 38–126)
BILIRUBIN TOTAL: 0.8 mg/dL (ref 0.3–1.2)
BUN: 11 mg/dL (ref 6–20)
CO2: 27 mmol/L (ref 22–32)
Calcium: 9.1 mg/dL (ref 8.9–10.3)
Chloride: 108 mmol/L (ref 101–111)
Creatinine, Ser: 1.03 mg/dL (ref 0.61–1.24)
Glucose, Bld: 117 mg/dL — ABNORMAL HIGH (ref 65–99)
POTASSIUM: 3.7 mmol/L (ref 3.5–5.1)
Sodium: 141 mmol/L (ref 135–145)
TOTAL PROTEIN: 6.8 g/dL (ref 6.5–8.1)

## 2015-09-13 LAB — SURGICAL PCR SCREEN
MRSA, PCR: NEGATIVE
STAPHYLOCOCCUS AUREUS: NEGATIVE

## 2015-09-13 LAB — PROTIME-INR
INR: 1.12 (ref 0.00–1.49)
PROTHROMBIN TIME: 14.6 s (ref 11.6–15.2)

## 2015-09-13 LAB — URINALYSIS, ROUTINE W REFLEX MICROSCOPIC
BILIRUBIN URINE: NEGATIVE
Glucose, UA: NEGATIVE mg/dL
Hgb urine dipstick: NEGATIVE
KETONES UR: NEGATIVE mg/dL
Leukocytes, UA: NEGATIVE
NITRITE: NEGATIVE
PH: 6 (ref 5.0–8.0)
Protein, ur: NEGATIVE mg/dL
Specific Gravity, Urine: 1.028 (ref 1.005–1.030)

## 2015-09-13 LAB — APTT: aPTT: 35 seconds (ref 24–37)

## 2015-09-13 NOTE — Pre-Procedure Instructions (Signed)
    MYCA LAOS  09/13/2015      [id number] - [place], [place] - [address] [place] [address] Phone: [phone] Fax: [phone]  [facility] [place], [place] - [id number] [place] [id number] [place] [address] [place] [address] Phone: [phone] Fax: [phone]  [facility] [place], [place]. [place]. [place] [address] Phone: [phone] Fax: [phone]    Your procedure is scheduled on 09/20/15.  Report to [facility] Admitting at 530 A.M.  Call this number if you have problems the morning of surgery:  [phone]   Remember:  Do not eat food or drink liquids after midnight.  Take these medicines the morning of surgery with A SIP OF WATER --carvedilol,oxycodone   Do not wear jewelry, make-up or nail polish.  Do not wear lotions, powders, or perfumes.  You may wear deodorant.  Do not shave 48 hours prior to surgery.  Men may shave face and neck.  Do not bring valuables to the hospital.  [facility] is not responsible for any belongings or valuables.  Contacts, dentures or bridgework may not be worn into surgery.  Leave your suitcase in the car.  After surgery it may be brought to your room.  For patients admitted to the hospital, discharge time will be determined by your treatment team.  Patients discharged the day of surgery will not be allowed to drive home. Name and phone number of your driver:   Special instructions:   Please read over the following fact sheets that you were given.

## 2015-09-19 MED ORDER — CEFAZOLIN SODIUM-DEXTROSE 2-4 GM/100ML-% IV SOLN
2.0000 g | INTRAVENOUS | Status: DC
Start: 2015-09-19 — End: 2015-09-20
  Filled 2015-09-19: qty 100

## 2015-09-19 MED ORDER — CEFAZOLIN SODIUM-DEXTROSE 2-4 GM/100ML-% IV SOLN: 2.0000 g | INTRAVENOUS | Status: DC

## 2015-09-19 NOTE — H&P (Signed)
[name] [name] is an 65 y.o. male.    CHIEF COMPLAINT:    Low back pain.   HISTORY OF PRESENT ILLNESS:   Patient is a 65 year old male.  He has an injury to his back that dates back to late October where he was sliding out of his truck and felt immediate pain and discomfort in his back, radiation of the right groin and inguinal area and into his right thigh.  He has been experiencing pain severe enough to warrant continued use of oxycodone medicines and he is taking a 10 mg tablet about every 4-6 hours to try and relieve pain that continues to persist in the right thigh and groin area.  He underwent a  more recent followup MRI scan because of findings of edema within the right paralumbar psoas muscle and the results of the more recent study done with enhancement indicate that he does have disk protrusions to the right L1-2 and L2-3 level and both of these extend into the right neuroforamen of L1 and L2 respectively.     Past Medical History  Diagnosis Date  . Nonischemic cardiomyopathy ([place])     [place] 06/2002 with normal cors; Myoview 11/08 neg for ischemia. Echo 4/08: EF of 35-40%.  Cause of NICM unknown.  Never a heavy drinker, used cocaine or amphetamines. HIV, SPEP, and ferritin/Fe studies were all negative in 12/09. Myoview (5/11): EF 42% with apical thinning but no evidence for ischemia or infarction. Echo (3/12): EF 45-50%, no significant valvular abnormalities.   . Hypertriglyceridemia   . Rotator cuff tendonitis     with hx of bilateral shoulder surgery  . HTN (hypertension)   . Knee osteoarthritis     s/p arhtroscopy 2010  . GERD (gastroesophageal reflux disease)     EGD 1/12 with erosive esophagitis and gastritis, biopsy postiive for H pylori (being  treated)  . Allergic rhinitis   . Gynecomastia     with spironolactone (ok on eplerenone)  . History of back surgery   . Diverticulosis     a. Colonoscopy (1/12) with mild diverticulosis and hemorrhoids  . Asthma     Probable mild  intermittent asthma  . Elevated LFTs     HCV negative, possibly due to ETOH  . CKD (chronic kidney disease)   . Sleep apnea     doesnot use cpap  . Anxiety     Past Surgical History  Procedure Laterality Date  . Ptca    . Stress cardiolite  12/15/05  . Transthoracic echocardiogram  07/13/06  . Electrocardiogram  09/20/06  . Rotator cuff repair      x3  . Joint replacement    . Back surgery      x2    Family History  Problem Relation Age of Onset  . Stroke Mother   . Prostate cancer Father   . Cardiomyopathy Brother     nonischemic, has icd   Social History:  reports that he has never smoked. He has never used smokeless tobacco. He reports that he drinks alcohol. He reports that he does not use illicit drugs.  Allergies: No Known Allergies  No prescriptions prior to admission    No results found for this or any previous visit (from the past 48 hour(s)). No results found.  Review of Systems  Constitutional: Negative.   Eyes: Negative.   Respiratory: Negative.   Cardiovascular: Negative.   Gastrointestinal: Negative.   Genitourinary: Negative.   Musculoskeletal: Positive for back pain.  Skin: Negative.  Neurological: Positive for headaches.  Psychiatric/Behavioral: Negative.     There were no vitals taken for this visit. Physical Exam  Constitutional: He is oriented to person, place, and time. No distress.  HENT:  Head: Atraumatic.  Eyes: EOM are normal.  Neck: Normal range of motion.  Cardiovascular: Normal rate.   Respiratory: He is in respiratory distress.  GI: He exhibits no distension.  Musculoskeletal: He exhibits tenderness.  Neurological: He is alert and oriented to person, place, and time.  Skin: Skin is warm and dry.  Psychiatric: He has a normal mood and affect.    PHYSICAL EXAMINATION:  There is a noted scar within the psoas muscle and a large segmental vessel noted between L1 and L2.  His pain pattern is into the right inguinal area just above  the right inguinal ligament and the right superior anterior iliac crest area, also over the right groin extending down about halfway down the thigh, it does not reach the knee.  He is weak in right hip flexion at 4-5 on the right, 5/5 on the left.  Knee extension strength is intact and normal both sides.  Foot dorsiflexion and plantar flexion strength is normal.  Adduction strength of the hip decreased on the right.     He shows me a ganglion cyst over his right volar wrist, this at the radial aspect of the wrist and he indicates that he was wondering if he could have this ganglion cyst removed at the same time.  Clinically the area involved is palpably about the size of a small marble about a centimeter in diameter.  It is over this patient's right distal radial wrist in the area of the radial styloid.  The flexor tendon is palpable to the medial aspect of this ganglion cyst, it is a little bit transilluminated.     In regards to a third complaint that he has after the initial two, he has pain over the right lateral elbow and he has discomfort here.  His range of motion of the right elbow is about 5 degrees short of full extension and flexion about 120 degrees.  Slight clicking sensation over the right lateral radiocapitellar joint.  He indicates that he thinks this is a tennis elbow.  It is, however, distal to the area of the lateral epicondyle and appears to be more related to the dorsal aspect of the radiocapitellar joint.  With internal and external rotation of the right forearm or pronation and supination there is felt to be some slight palpable grating here.   ASSESSMENT:   1.  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  Risks of surgery include risks of infection, bleeding, risks  of neurologic compromise.   2.  Right-sided volar radial wrist ganglion cyst.   RADIOGRAPHS:   Plain radiographs of the right wrist show some slight widening of the distal metaphysis of the right radius from an old previous radius injury.  He has also some spur forming along the lateral proximal margin of the scaphoid and he potentially may have  had an old scaphoid injury.  No lucency is noted.   PLAN:  If he is undergoing general anesthetic for surgical treatment of right-sided L1-2 and L2-3 extraforaminal disk herniations, I think including a resection of the patient's ganglion cyst is reasonable.  We would go ahead and add this to the consent form for this to be done after the lumbar procedure.  I discussed cortisone injection of the right radiocapitellar joint with Quillian Quince.  My intention is to try and improve his overall pain pattern to try and relieve any inflammation that may be associated with radiocapitellar  joint pain, either due to a SLAP injury or mild arthrosis.  He wishes to proceed after discussion of the risks and benefits.  PROCEDURE:  The patient's clinical condition is marked by substantial pain and/or significant functional disability. Other conservative therapy has not provided relief, is contraindicated, or not appropriate. There is a reasonable likelihood that injection will significantly improve the patient's pain and/or functional impairment.  I placed him in the supine position with the right elbow in extension.  The triangle between the lateral epicondyle, the patient's radial head and the olecranon process is then identified and the central area then represents the radiocapitellar joint.  In palpating this area, that is where he is most impressibly painful.  One can feel  the interval between the patient's capitella and the radial head.  This area is marked, gloves were used, Betadine was used to prep the area and then a 25-gauge needle inserted into the radiocapitellar joint using ethylene chloride cold spray for local anesthetic.  Then instilled into the right elbow joint a total of 2 mL of Marcaine 0.5% plain and 0.5 mL Depo-Medrol 40 mg/mL, which he tolerated well.  He had good improvement in overall pain in the right elbow.  My impression is this likely represents either synovitis or degenerative changes to the radiocapitellar joint.  Hopefully he will see improvement with the cortisone injection and not need any further treatment here.  If this pain recurs radiographs would be recommended at that time.  We will go ahead and schedule him then for intervention in regards to his lumbar spine and the right wrist volar radial ganglion cyst.  Lanae Crumbly, PA-C 09/19/2015, 2:19 PM

## 2015-09-20 ENCOUNTER — Ambulatory Visit (HOSPITAL_COMMUNITY): Payer: Medicare Other

## 2015-09-20 ENCOUNTER — Encounter (HOSPITAL_COMMUNITY): Payer: Self-pay | Admitting: Surgery

## 2015-09-20 ENCOUNTER — Encounter (HOSPITAL_COMMUNITY)
Admission: AD | Disposition: A | Discharge status: Home / Self Care | Payer: Self-pay | Source: Ambulatory Visit | Attending: Specialist

## 2015-09-20 ENCOUNTER — Ambulatory Visit (HOSPITAL_COMMUNITY): Payer: Medicare Other | Admitting: Anesthesiology

## 2015-09-20 ENCOUNTER — Inpatient Hospital Stay (HOSPITAL_COMMUNITY)
Admission: AD | Admit: 2015-09-20 | Discharge: 2015-09-21 | DRG: 519 | Disposition: A | Discharge status: Home / Self Care | Payer: Medicare Other | Source: Ambulatory Visit | Attending: Specialist | Admitting: Specialist

## 2015-09-20 DIAGNOSIS — Z8042 Family history of malignant neoplasm of prostate: Secondary | ICD-10-CM | POA: Diagnosis not present

## 2015-09-20 DIAGNOSIS — M5116 Intervertebral disc disorders with radiculopathy, lumbar region: Principal | ICD-10-CM | POA: Diagnosis present

## 2015-09-20 DIAGNOSIS — K219 Gastro-esophageal reflux disease without esophagitis: Secondary | ICD-10-CM | POA: Diagnosis present

## 2015-09-20 DIAGNOSIS — Z823 Family history of stroke: Secondary | ICD-10-CM

## 2015-09-20 DIAGNOSIS — M67431 Ganglion, right wrist: Secondary | ICD-10-CM | POA: Diagnosis present

## 2015-09-20 DIAGNOSIS — E781 Pure hyperglyceridemia: Secondary | ICD-10-CM | POA: Diagnosis present

## 2015-09-20 DIAGNOSIS — I10 Essential (primary) hypertension: Secondary | ICD-10-CM | POA: Diagnosis present

## 2015-09-20 DIAGNOSIS — G4730 Sleep apnea, unspecified: Secondary | ICD-10-CM | POA: Diagnosis present

## 2015-09-20 DIAGNOSIS — I428 Other cardiomyopathies: Secondary | ICD-10-CM | POA: Diagnosis present

## 2015-09-20 DIAGNOSIS — M67439 Ganglion, unspecified wrist: Secondary | ICD-10-CM | POA: Diagnosis present

## 2015-09-20 DIAGNOSIS — N62 Hypertrophy of breast: Secondary | ICD-10-CM | POA: Diagnosis present

## 2015-09-20 DIAGNOSIS — Z419 Encounter for procedure for purposes other than remedying health state, unspecified: Secondary | ICD-10-CM

## 2015-09-20 HISTORY — PX: LUMBAR LAMINECTOMY: SHX95

## 2015-09-20 HISTORY — PX: GANGLION CYST EXCISION: SHX1691

## 2015-09-20 SURGERY — MICRODISCECTOMY LUMBAR LAMINECTOMY
Anesthesia: General | Site: Wrist | Laterality: Right

## 2015-09-20 MED ORDER — PANTOPRAZOLE SODIUM 40 MG PO TBEC
40.0000 mg | DELAYED_RELEASE_TABLET | Freq: Every day | ORAL | Status: DC
  Administered 2015-09-20 – 2015-09-21 (×2): 40 mg via ORAL
  Filled 2015-09-20 (×2): qty 1

## 2015-09-20 MED ORDER — FLEET ENEMA 7-19 GM/118ML RE ENEM: 1.0000 | ENEMA | Freq: Once | RECTAL | Status: DC | PRN

## 2015-09-20 MED ORDER — PHENYLEPHRINE HCL 10 MG/ML IJ SOLN
INTRAMUSCULAR | Status: DC | PRN
  Administered 2015-09-20: 40 ug via INTRAVENOUS
  Administered 2015-09-20 (×2): 80 ug via INTRAVENOUS

## 2015-09-20 MED ORDER — LIDOCAINE HCL (CARDIAC) 20 MG/ML IV SOLN
INTRAVENOUS | Status: DC | PRN
  Administered 2015-09-20: 60 mg via INTRAVENOUS

## 2015-09-20 MED ORDER — BUPIVACAINE HCL (PF) 0.5 % IJ SOLN
INTRAMUSCULAR | Status: AC
  Filled 2015-09-20: qty 30

## 2015-09-20 MED ORDER — CARVEDILOL 12.5 MG PO TABS
12.5000 mg | ORAL_TABLET | Freq: Two times a day (BID) | ORAL | Status: DC
  Administered 2015-09-20 – 2015-09-21 (×2): 12.5 mg via ORAL
  Filled 2015-09-20 (×2): qty 1

## 2015-09-20 MED ORDER — OXYCODONE-ACETAMINOPHEN 5-325 MG PO TABS
1.0000 | ORAL_TABLET | ORAL | Status: DC | PRN
  Administered 2015-09-20 – 2015-09-21 (×4): 2 via ORAL
  Filled 2015-09-20 (×4): qty 2

## 2015-09-20 MED ORDER — DOCUSATE SODIUM 100 MG PO CAPS
100.0000 mg | ORAL_CAPSULE | Freq: Two times a day (BID) | ORAL | Status: DC
  Administered 2015-09-20 – 2015-09-21 (×2): 100 mg via ORAL
  Filled 2015-09-20 (×3): qty 1

## 2015-09-20 MED ORDER — HYDROMORPHONE HCL 1 MG/ML IJ SOLN
0.2500 mg | INTRAMUSCULAR | Status: DC | PRN
  Administered 2015-09-20 (×4): 0.5 mg via INTRAVENOUS

## 2015-09-20 MED ORDER — SACUBITRIL-VALSARTAN 24-26 MG PO TABS
1.0000 | ORAL_TABLET | Freq: Two times a day (BID) | ORAL | Status: DC
  Administered 2015-09-20 – 2015-09-21 (×2): 1 via ORAL
  Filled 2015-09-20 (×2): qty 1

## 2015-09-20 MED ORDER — THROMBIN 20000 UNITS EX KIT
PACK | CUTANEOUS | Status: DC | PRN
  Administered 2015-09-20: 20 mL via TOPICAL

## 2015-09-20 MED ORDER — ACETAMINOPHEN 650 MG RE SUPP: 650.0000 mg | RECTAL | Status: DC | PRN

## 2015-09-20 MED ORDER — CHLORHEXIDINE GLUCONATE 4 % EX LIQD: 60.0000 mL | Freq: Once | CUTANEOUS | Status: DC

## 2015-09-20 MED ORDER — SUGAMMADEX SODIUM 200 MG/2ML IV SOLN
INTRAVENOUS | Status: DC | PRN
  Administered 2015-09-20: 200 mg via INTRAVENOUS

## 2015-09-20 MED ORDER — LACTATED RINGERS IV SOLN
INTRAVENOUS | Status: DC | PRN
  Administered 2015-09-20 (×3): via INTRAVENOUS

## 2015-09-20 MED ORDER — PROMETHAZINE HCL 25 MG/ML IJ SOLN: 6.2500 mg | INTRAMUSCULAR | Status: DC | PRN

## 2015-09-20 MED ORDER — FENOFIBRATE 54 MG PO TABS: 54.0000 mg | ORAL_TABLET | Freq: Every day | ORAL | Status: DC

## 2015-09-20 MED ORDER — HYDROMORPHONE HCL 1 MG/ML IJ SOLN
INTRAMUSCULAR | Status: AC
  Filled 2015-09-20: qty 1

## 2015-09-20 MED ORDER — SODIUM CHLORIDE 0.9 % IV SOLN: 250.0000 mL | INTRAVENOUS | Status: DC

## 2015-09-20 MED ORDER — PROPOFOL 10 MG/ML IV BOLUS
INTRAVENOUS | Status: DC | PRN
  Administered 2015-09-20: 150 mg via INTRAVENOUS

## 2015-09-20 MED ORDER — MENTHOL 3 MG MT LOZG: 1.0000 | LOZENGE | OROMUCOSAL | Status: DC | PRN

## 2015-09-20 MED ORDER — 0.9 % SODIUM CHLORIDE (POUR BTL) OPTIME
TOPICAL | Status: DC | PRN
  Administered 2015-09-20: 1000 mL

## 2015-09-20 MED ORDER — BUPIVACAINE HCL (PF) 0.5 % IJ SOLN
INTRAMUSCULAR | Status: DC | PRN
  Administered 2015-09-20: 4 mL

## 2015-09-20 MED ORDER — SODIUM CHLORIDE 0.9 % IV SOLN
INTRAVENOUS | Status: DC
  Administered 2015-09-21: 02:00:00 via INTRAVENOUS

## 2015-09-20 MED ORDER — FENTANYL CITRATE (PF) 250 MCG/5ML IJ SOLN
INTRAMUSCULAR | Status: DC | PRN
  Administered 2015-09-20: 25 ug via INTRAVENOUS
  Administered 2015-09-20: 50 ug via INTRAVENOUS
  Administered 2015-09-20: 25 ug via INTRAVENOUS
  Administered 2015-09-20: 100 ug via INTRAVENOUS
  Administered 2015-09-20: 50 ug via INTRAVENOUS

## 2015-09-20 MED ORDER — ROCURONIUM BROMIDE 100 MG/10ML IV SOLN
INTRAVENOUS | Status: DC | PRN
  Administered 2015-09-20: 50 mg via INTRAVENOUS

## 2015-09-20 MED ORDER — SODIUM CHLORIDE 0.9% FLUSH: 3.0000 mL | INTRAVENOUS | Status: DC | PRN

## 2015-09-20 MED ORDER — SODIUM CHLORIDE 0.9% FLUSH
3.0000 mL | Freq: Two times a day (BID) | INTRAVENOUS | Status: DC
  Administered 2015-09-20: 3 mL via INTRAVENOUS

## 2015-09-20 MED ORDER — MORPHINE SULFATE (PF) 2 MG/ML IV SOLN
1.0000 mg | INTRAVENOUS | Status: DC | PRN
  Administered 2015-09-20 – 2015-09-21 (×3): 2 mg via INTRAVENOUS
  Filled 2015-09-20 (×3): qty 1

## 2015-09-20 MED ORDER — HYDROCODONE-ACETAMINOPHEN 5-325 MG PO TABS
1.0000 | ORAL_TABLET | ORAL | Status: DC | PRN
  Administered 2015-09-20 – 2015-09-21 (×2): 2 via ORAL
  Filled 2015-09-20 (×2): qty 2

## 2015-09-20 MED ORDER — MIDAZOLAM HCL 5 MG/5ML IJ SOLN
INTRAMUSCULAR | Status: DC | PRN
  Administered 2015-09-20: 2 mg via INTRAVENOUS

## 2015-09-20 MED ORDER — CEFAZOLIN SODIUM-DEXTROSE 2-3 GM-% IV SOLR
INTRAVENOUS | Status: DC | PRN
  Administered 2015-09-20: 2 g via INTRAVENOUS

## 2015-09-20 MED ORDER — ONDANSETRON HCL 4 MG/2ML IJ SOLN: 4.0000 mg | INTRAMUSCULAR | Status: DC | PRN

## 2015-09-20 MED ORDER — MIDAZOLAM HCL 2 MG/2ML IJ SOLN
INTRAMUSCULAR | Status: AC
  Filled 2015-09-20: qty 2

## 2015-09-20 MED ORDER — BUPIVACAINE LIPOSOME 1.3 % IJ SUSP
20.0000 mL | INTRAMUSCULAR | Status: AC
  Administered 2015-09-20: 10 mL
  Filled 2015-09-20: qty 20

## 2015-09-20 MED ORDER — PHENOL 1.4 % MT LIQD: 1.0000 | OROMUCOSAL | Status: DC | PRN

## 2015-09-20 MED ORDER — THROMBIN 20000 UNITS EX SOLR
CUTANEOUS | Status: AC
  Filled 2015-09-20: qty 20000

## 2015-09-20 MED ORDER — ROCURONIUM BROMIDE 50 MG/5ML IV SOLN
INTRAVENOUS | Status: AC
  Filled 2015-09-20: qty 1

## 2015-09-20 MED ORDER — PROPOFOL 10 MG/ML IV BOLUS
INTRAVENOUS | Status: AC
  Filled 2015-09-20: qty 20

## 2015-09-20 MED ORDER — KETOROLAC TROMETHAMINE 30 MG/ML IJ SOLN
30.0000 mg | Freq: Once | INTRAMUSCULAR | Status: AC
  Administered 2015-09-20: 30 mg via INTRAVENOUS
  Filled 2015-09-20: qty 1

## 2015-09-20 MED ORDER — DEXAMETHASONE SODIUM PHOSPHATE 4 MG/ML IJ SOLN
INTRAMUSCULAR | Status: DC | PRN
  Administered 2015-09-20: 10 mg via INTRAVENOUS

## 2015-09-20 MED ORDER — CEFAZOLIN SODIUM 1-5 GM-% IV SOLN
1.0000 g | Freq: Three times a day (TID) | INTRAVENOUS | Status: AC
  Administered 2015-09-20 (×2): 1 g via INTRAVENOUS
  Filled 2015-09-20 (×2): qty 50

## 2015-09-20 MED ORDER — ASPIRIN 325 MG PO TABS: 81.0000 mg | ORAL_TABLET | Freq: Every day | ORAL | Status: DC

## 2015-09-20 MED ORDER — ACETAMINOPHEN 325 MG PO TABS: 650.0000 mg | ORAL_TABLET | ORAL | Status: DC | PRN

## 2015-09-20 MED ORDER — BUPIVACAINE HCL 0.5 % IJ SOLN
INTRAMUSCULAR | Status: DC | PRN
  Administered 2015-09-20: 10 mL

## 2015-09-20 MED ORDER — POLYETHYLENE GLYCOL 3350 17 G PO PACK: 17.0000 g | PACK | Freq: Every day | ORAL | Status: DC | PRN

## 2015-09-20 MED ORDER — EPHEDRINE SULFATE 50 MG/ML IJ SOLN
INTRAMUSCULAR | Status: DC | PRN
  Administered 2015-09-20: 10 mg via INTRAVENOUS
  Administered 2015-09-20: 5 mg via INTRAVENOUS
  Administered 2015-09-20 (×2): 10 mg via INTRAVENOUS
  Administered 2015-09-20: 5 mg via INTRAVENOUS
  Administered 2015-09-20: 10 mg via INTRAVENOUS

## 2015-09-20 MED ORDER — LIDOCAINE 2% (20 MG/ML) 5 ML SYRINGE
INTRAMUSCULAR | Status: AC
  Filled 2015-09-20: qty 5

## 2015-09-20 MED ORDER — SPIRONOLACTONE 25 MG PO TABS
25.0000 mg | ORAL_TABLET | Freq: Every day | ORAL | Status: DC
  Administered 2015-09-20 – 2015-09-21 (×2): 25 mg via ORAL
  Filled 2015-09-20 (×2): qty 1

## 2015-09-20 MED ORDER — FENTANYL CITRATE (PF) 250 MCG/5ML IJ SOLN
INTRAMUSCULAR | Status: AC
  Filled 2015-09-20: qty 5

## 2015-09-20 MED ORDER — ATORVASTATIN CALCIUM 40 MG PO TABS: 40.0000 mg | ORAL_TABLET | Freq: Every day | ORAL | Status: DC

## 2015-09-20 MED ORDER — CYCLOBENZAPRINE HCL 10 MG PO TABS
10.0000 mg | ORAL_TABLET | Freq: Two times a day (BID) | ORAL | Status: DC | PRN
  Administered 2015-09-20 – 2015-09-21 (×3): 10 mg via ORAL
  Filled 2015-09-20 (×3): qty 1

## 2015-09-20 MED ORDER — ASPIRIN EC 81 MG PO TBEC
81.0000 mg | DELAYED_RELEASE_TABLET | Freq: Every day | ORAL | Status: DC
  Administered 2015-09-20 – 2015-09-21 (×2): 81 mg via ORAL
  Filled 2015-09-20 (×2): qty 1

## 2015-09-20 MED ORDER — BISACODYL 5 MG PO TBEC
5.0000 mg | DELAYED_RELEASE_TABLET | Freq: Every day | ORAL | Status: DC | PRN
  Administered 2015-09-20: 5 mg via ORAL
  Filled 2015-09-20: qty 1

## 2015-09-20 MED ORDER — ONDANSETRON HCL 4 MG/2ML IJ SOLN
INTRAMUSCULAR | Status: DC | PRN
  Administered 2015-09-20: 4 mg via INTRAVENOUS

## 2015-09-20 SURGICAL SUPPLY — 90 items
ADH SKN CLS APL DERMABOND .7 (GAUZE/BANDAGES/DRESSINGS) ×2
ADH SKN CLS LQ APL DERMABOND (GAUZE/BANDAGES/DRESSINGS) ×2
APL SKNCLS STERI-STRIP NONHPOA (GAUZE/BANDAGES/DRESSINGS) ×2
BANDAGE ACE 3X5.8 VEL STRL LF (GAUZE/BANDAGES/DRESSINGS) ×2 IMPLANT
BANDAGE ACE 4X5 VEL STRL LF (GAUZE/BANDAGES/DRESSINGS) ×2 IMPLANT
BANDAGE ELASTIC 3 VELCRO ST LF (GAUZE/BANDAGES/DRESSINGS) ×4 IMPLANT
BENZOIN TINCTURE PRP APPL 2/3 (GAUZE/BANDAGES/DRESSINGS) ×2 IMPLANT
BNDG CMPR 9X4 STRL LF SNTH (GAUZE/BANDAGES/DRESSINGS) ×4
BNDG ESMARK 4X9 LF (GAUZE/BANDAGES/DRESSINGS) ×6 IMPLANT
BNDG GAUZE ELAST 4 BULKY (GAUZE/BANDAGES/DRESSINGS) ×2 IMPLANT
BUR ROUND FLUTED 4 SOFT TCH (BURR) IMPLANT
BUR ROUND FLUTED 4MM SOFT TCH (BURR)
BUR SABER RD CUTTING 3.0 (BURR) ×3 IMPLANT
BUR SABER RD CUTTING 3.0MM (BURR) ×1
CANISTER SUCTION 2500CC (MISCELLANEOUS) ×4 IMPLANT
CLOSURE WOUND 1/2 X4 (GAUZE/BANDAGES/DRESSINGS) ×1
COVER MAYO STAND STRL (DRAPES) ×2 IMPLANT
COVER SURGICAL LIGHT HANDLE (MISCELLANEOUS) ×6 IMPLANT
CUFF TOURNIQUET SINGLE 18IN (TOURNIQUET CUFF) ×4 IMPLANT
CUFF TOURNIQUET SINGLE 24IN (TOURNIQUET CUFF) IMPLANT
DERMABOND ADHESIVE PROPEN (GAUZE/BANDAGES/DRESSINGS) ×2
DERMABOND ADVANCED (GAUZE/BANDAGES/DRESSINGS) ×2
DERMABOND ADVANCED .7 DNX12 (GAUZE/BANDAGES/DRESSINGS) ×2 IMPLANT
DERMABOND ADVANCED .7 DNX6 (GAUZE/BANDAGES/DRESSINGS) IMPLANT
DRAPE C-ARM 42X72 X-RAY (DRAPES) IMPLANT
DRAPE MICROSCOPE LEICA (MISCELLANEOUS) ×4 IMPLANT
DRAPE POUCH INSTRU U-SHP 10X18 (DRAPES) ×2 IMPLANT
DRAPE PROXIMA HALF (DRAPES) IMPLANT
DRAPE SURG 17X23 STRL (DRAPES) ×16 IMPLANT
DRAPE U-SHAPE 47X51 STRL (DRAPES) ×4 IMPLANT
DRSG MEPILEX BORDER 4X4 (GAUZE/BANDAGES/DRESSINGS) ×2 IMPLANT
DRSG MEPILEX BORDER 4X8 (GAUZE/BANDAGES/DRESSINGS) IMPLANT
DURAPREP 26ML APPLICATOR (WOUND CARE) ×6 IMPLANT
ELECT BLADE 4.0 EZ CLEAN MEGAD (MISCELLANEOUS) ×4
ELECT CAUTERY BLADE 6.4 (BLADE) ×4 IMPLANT
ELECT REM PT RETURN 9FT ADLT (ELECTROSURGICAL) ×4
ELECTRODE BLDE 4.0 EZ CLN MEGD (MISCELLANEOUS) ×2 IMPLANT
ELECTRODE REM PT RTRN 9FT ADLT (ELECTROSURGICAL) ×2 IMPLANT
GAUZE SPONGE 4X4 12PLY STRL (GAUZE/BANDAGES/DRESSINGS) ×2 IMPLANT
GAUZE XEROFORM 1X8 LF (GAUZE/BANDAGES/DRESSINGS) ×4 IMPLANT
GLOVE BIOGEL PI IND STRL 8 (GLOVE) ×2 IMPLANT
GLOVE BIOGEL PI INDICATOR 8 (GLOVE) ×6
GLOVE ECLIPSE 8.5 STRL (GLOVE) ×8 IMPLANT
GLOVE ECLIPSE 9.0 STRL (GLOVE) ×2 IMPLANT
GLOVE ORTHO TXT STRL SZ7.5 (GLOVE) ×8 IMPLANT
GLOVE SURG 8.5 LATEX PF (GLOVE) ×8 IMPLANT
GOWN STRL REUS W/ TWL LRG LVL3 (GOWN DISPOSABLE) ×2 IMPLANT
GOWN STRL REUS W/TWL 2XL LVL3 (GOWN DISPOSABLE) ×16 IMPLANT
GOWN STRL REUS W/TWL LRG LVL3 (GOWN DISPOSABLE) ×8
KIT BASIN OR (CUSTOM PROCEDURE TRAY) ×4 IMPLANT
KIT ROOM TURNOVER OR (KITS) ×4 IMPLANT
NDL SPNL 18GX3.5 QUINCKE PK (NEEDLE) ×4 IMPLANT
NEEDLE SPNL 18GX3.5 QUINCKE PK (NEEDLE) ×8 IMPLANT
NS IRRIG 1000ML POUR BTL (IV SOLUTION) ×4 IMPLANT
PACK LAMINECTOMY ORTHO (CUSTOM PROCEDURE TRAY) ×4 IMPLANT
PACK ORTHO EXTREMITY (CUSTOM PROCEDURE TRAY) ×4 IMPLANT
PAD ARMBOARD 7.5X6 YLW CONV (MISCELLANEOUS) ×8 IMPLANT
PAD CAST 3X4 CTTN HI CHSV (CAST SUPPLIES) IMPLANT
PAD CAST 4YDX4 CTTN HI CHSV (CAST SUPPLIES) ×2 IMPLANT
PADDING CAST COTTON 3X4 STRL (CAST SUPPLIES) ×4
PADDING CAST COTTON 4X4 STRL (CAST SUPPLIES)
PADDING CAST SYNTHETIC 4 (CAST SUPPLIES) ×2
PADDING CAST SYNTHETIC 4X4 STR (CAST SUPPLIES) IMPLANT
PATTIES SURGICAL .5 X.5 (GAUZE/BANDAGES/DRESSINGS) IMPLANT
PATTIES SURGICAL .75X.75 (GAUZE/BANDAGES/DRESSINGS) IMPLANT
SPONGE GAUZE 4X4 12PLY STER LF (GAUZE/BANDAGES/DRESSINGS) ×2 IMPLANT
SPONGE LAP 4X18 X RAY DECT (DISPOSABLE) ×2 IMPLANT
SPONGE SURGIFOAM ABS GEL 100 (HEMOSTASIS) ×4 IMPLANT
STRIP CLOSURE SKIN 1/2X4 (GAUZE/BANDAGES/DRESSINGS) ×3 IMPLANT
SUCTION FRAZIER HANDLE 10FR (MISCELLANEOUS) ×2
SUCTION TUBE FRAZIER 10FR DISP (MISCELLANEOUS) ×2 IMPLANT
SUT ETHILON 4 0 PS 2 18 (SUTURE) IMPLANT
SUT PROLENE 3 0 PS 2 (SUTURE) ×2 IMPLANT
SUT VIC AB 1 CTX 36 (SUTURE) ×8
SUT VIC AB 1 CTX36XBRD ANBCTR (SUTURE) ×2 IMPLANT
SUT VIC AB 2-0 CT1 27 (SUTURE) ×8
SUT VIC AB 2-0 CT1 TAPERPNT 27 (SUTURE) ×2 IMPLANT
SUT VIC AB 3-0 PS2 18 (SUTURE) ×4
SUT VIC AB 3-0 PS2 18XBRD (SUTURE) ×2 IMPLANT
SUT VIC AB 3-0 X1 27 (SUTURE) ×6 IMPLANT
SUT VICRYL 0 UR6 27IN ABS (SUTURE) ×4 IMPLANT
SYR 20CC LL (SYRINGE) ×4 IMPLANT
SYR CONTROL 10ML LL (SYRINGE) ×6 IMPLANT
TOWEL OR 17X24 6PK STRL BLUE (TOWEL DISPOSABLE) ×4 IMPLANT
TOWEL OR 17X26 10 PK STRL BLUE (TOWEL DISPOSABLE) ×4 IMPLANT
TRAY FOLEY CATH 16FRSI W/METER (SET/KITS/TRAYS/PACK) IMPLANT
TUBE CONNECTING 12'X1/4 (SUCTIONS) ×1
TUBE CONNECTING 12X1/4 (SUCTIONS) ×3 IMPLANT
UNDERPAD 30X30 INCONTINENT (UNDERPADS AND DIAPERS) ×4 IMPLANT
WATER STERILE IRR 1000ML POUR (IV SOLUTION) ×4 IMPLANT

## 2015-09-20 NOTE — Progress Notes (Signed)
Pt admitted to room from pacu via bed with IV intact and transfusing. Pt A&O x4; back incision dsg has clean, dry and intact Mepilex dsg. RUE has clean, dry and intact compression wrap/cast dsg. Neuro check wnl. VSS; pt oriented to the unit and room; safety and fall precaution/prevention education completed with pt. Pt call light within reach and pt agrees to call or use call light. Will continue to monitor pt closely. Delia Heady RN

## 2015-09-20 NOTE — Interval H&P Note (Signed)
Patient was seen and examined in the preop holding area. There has been no interval  Change in this patient's exam preop  history and physical exam  Lab tests and images have been examined and reviewed.  The Risks benefits and alternative treatments have been discussed  extensively,questions answered.  The patient has elected to undergo the discussed surgical treatment. 

## 2015-09-20 NOTE — Anesthesia Postprocedure Evaluation (Signed)
Anesthesia Post Note  Patient: [name] [name]  Procedure(s) Performed: Procedure(s) (LRB): FAR LATERAL APPROACH TO EXCISE HERNIATED NUCLEUS PULPOSUS RIGHT Lumbar 1- Lumbar 2 AND RIGHT Lumbar 2-Lumbar 3, EXCISION OF GANGLION CYST RIGHT VOLAR RADIAL WRIST (Right) REMOVAL GANGLION OF WRIST (Right)  Patient location during evaluation: PACU Anesthesia Type: General Level of consciousness: awake and alert Pain management: pain level controlled Vital Signs Assessment: post-procedure vital signs reviewed and stable Respiratory status: spontaneous breathing, nonlabored ventilation, respiratory function stable and patient connected to nasal cannula oxygen Cardiovascular status: blood pressure returned to baseline and stable Postop Assessment: no signs of nausea or vomiting Anesthetic complications: no    Last Vitals:  Filed Vitals:   09/20/15 1247 09/20/15 1303  BP: 117/67 119/65  Pulse: 71 70  Temp:    Resp: 17 13    Last Pain:  Filed Vitals:   09/20/15 1303  PainSc: 4                  Jatasia Gundrum,JAMES TERRILL

## 2015-09-20 NOTE — Anesthesia Procedure Notes (Signed)
Procedure Name: Intubation Date/Time: 09/20/2015 7:44 AM Performed by: Ollen Bowl Pre-anesthesia Checklist: Patient identified, Emergency Drugs available, Suction available, Patient being monitored and Timeout performed Patient Re-evaluated:Patient Re-evaluated prior to inductionOxygen Delivery Method: Circle system utilized and Simple face mask Preoxygenation: Pre-oxygenation with 100% oxygen Intubation Type: IV induction Ventilation: Mask ventilation without difficulty Laryngoscope Size: Miller and 3 Grade View: Grade I Tube type: Oral Tube size: 7.5 mm Number of attempts: 1 Airway Equipment and Method: Patient positioned with wedge pillow and Stylet Placement Confirmation: ETT inserted through vocal cords under direct vision,  positive ETCO2 and breath sounds checked- equal and bilateral Secured at: 22 cm Tube secured with: Tape Dental Injury: Teeth and Oropharynx as per pre-operative assessment

## 2015-09-20 NOTE — Transfer of Care (Signed)
Immediate Anesthesia Transfer of Care Note  Patient: [name] [name]  Procedure(s) Performed: Procedure(s): FAR LATERAL APPROACH TO EXCISE HERNIATED NUCLEUS PULPOSUS RIGHT Lumbar 1- Lumbar 2 AND RIGHT Lumbar 2-Lumbar 3, EXCISION OF GANGLION CYST RIGHT VOLAR RADIAL WRIST (Right) REMOVAL GANGLION OF WRIST (Right)  Patient Location: PACU  Anesthesia Type:General  Level of Consciousness: awake, alert  and oriented  Airway & Oxygen Therapy: Patient Spontanous Breathing and Patient connected to nasal cannula oxygen  Post-op Assessment: Report given to RN, Post -op Vital signs reviewed and stable and Patient moving all extremities X 4  Post vital signs: Reviewed and stable  Last Vitals:  Filed Vitals:   09/20/15 0616  BP: 122/59  Pulse: 68  Temp: 36.8 C  Resp: 20    Last Pain: There were no vitals filed for this visit.    Patients Stated Pain Goal: 3 ([id number])  Complications: No apparent anesthesia complications

## 2015-09-20 NOTE — Op Note (Signed)
09/20/2015  11:54 AM  PATIENT:  [name] [name]  65 y.o. male  MRN: [id number]  OPERATIVE REPORT  PRE-OPERATIVE DIAGNOSIS:  Right L1-2 and L2-3 herniated nucleus pulposus, right volar radial wrist ganglion cyst  POST-OPERATIVE DIAGNOSIS:  Herniated nucleic pulposis Lumbar 1-Lumbar 2, Lumbar 2-Lumbar 3, Right Volar wrist ganglion   PROCEDURE:  Procedure(s): FAR LATERAL APPROACH TO EXCISE HERNIATED NUCLEUS PULPOSUS RIGHT Lumbar 1- Lumbar 2 AND RIGHT Lumbar 2-Lumbar 3, EXCISION OF GANGLION CYST RIGHT VOLAR RADIAL WRIST REMOVAL GANGLION OF WRIST    SURGEON:  Jessy Oto, MD     ASSISTANT:  Benjiman Core, PA-C  (Present throughout the entire procedure and necessary for completion of procedure in a timely manner)     ANESTHESIA:  General, supplemented with local anesthesia Marcaine 1/2% 1:1 with exparel 1.3% total of 25 mL used. Dr Orene Desanctis.  EBL: 88KC    COMPLICATIONS:  None.    PROCEDURE: The patient was met in the holding area, and the appropriate right L2-3 and right L1-2 and right wrist identified and marked with an "X" and my initials.  The patient was then transported to [place] #5. The patient was then placed under  general anesthesia without difficulty and was placed on the operative table in a prone position. Wilson frame and sliding OR table. The patient received appropriate preoperative antibiotic prophylaxis Ancef 2 gm. Foley catheter placed sterilely to straight drain.  The thoracolumbar spine then prepped using sterile conditions with DuraPrep and draped using sterile technique.  Time-out procedure was called and correct. Loupe magnification and head lamp was used. 2 spinal needles placed to the right of the midline by 3 cm and intraoperative lateral C-arm demonstrated the lower needle directed towards the L3-4 disc on the right side and the upper needle placed at the L2-3 level at the level of the L3 pedicle. Skin infiltrated in the right side paramedian skin area with  Marcaine half percent1:1 exparel 1.3% .Marland Kitchen Skin incision approximately 7 cm in length sagittal orientation paramedian approximately 3-1/2 cm for the right of the midline. Incision through skin and subcutaneous layers to the lumbodorsal fascia bleeders controlled using electrocautery. Lumbodorsal fascia then incised in line with the skin incision and then blunt dissection using a finger down to the level of the transverse processes of the [place] lateral approach. The insight retractors then placed in the incision and lateral cross table lateral  demonstrated a penfield 4 at the level of the L1-2 disc space so that these were redirected and exposure obtained of the posterior lateral aspect of the L1-2 and L2-3 levels identifying the transverse process of L2 and L3 then placing additional medial and lateral retractors and superior and inferior retractors. Small amount of muscle was debrided over the posterior aspect of the intertransverse membrane. And then a small amount of the superior aspect of the transverse process of L3 and L2 was resected using 2 and 3 mm Kerrisons releasing the intertransverse membrane and ligament. The operating room microscope carefully draped brought into the field sterilely. Under the operating room microscope then the superior medial and to the transverse process of L3 was carefully exposed and the pedicle of L4 identified. Small leash of vessels cauterized using bipolar electrocautery just medial and superior to the transverse process of L3 at its intersection with the pedicle and the lateral aspect of the superior articular process of L3. Penfield 4 was then used to carefully palpate along the superior lateral aspect of the L3 pedicle identifing the disc  space. The Derricho retractor was then used the carefully retract the inferior medial aspect of the L2 nerve root superiorly and laterally. With this then identified with careful palpation using a Penfield 4. 15 blade scalpel then used to  perform a vertical incision into the posterior lateral aspect of the L2-3 disc and the opening into the disc developed using micropituitary as well as pituitaries with teeth directing the pituitaries medially, resecting disc material. Several small fragments of disc material were resected from the area just beneath the annulus.Careful inspection of the neuroforamen of a ball-tipped probe as well as a hockey-stick nerve probe demonstrated neuroforamen to be free and the L2 nerve root exiting without further compression following the resection of the small amount of disc material and entry into the disc material.  The nerve root was freed from pressure posteriorly this area by resecting portion of the intertransverse ligament as it pressed nerve forward against this protruded disc. Under the operating room microscope then the superior medial and to the transverse process of L3 was carefully exposed and the pedicle of L4 identified. Small leash of vessels cauterized using bipolar electrocautery just medial and superior to the transverse process of L2 at its intersection with the pedicle and the lateral aspect of the superior articular process of L2. Penfield 4 was then used to carefully palpate along the superior lateral aspect of the L2 pedicle identifing the disc space. The Derricho retractor was then used the carefully retract the inferior medial aspect of the L1 nerve root superiorly and laterally. With this then identified with careful palpation using a Penfield 4. 15 blade scalpel then used to perform a vertical incision into the posterior lateral aspect of the L1-2 disc and the opening into the disc developed using micropituitary as well as pituitaries with teeth directing the pituitaries medially, resecting disc material. Several small fragments of disc material were resected from the area just beneath the annulus.an end-cutting C5 resector was used to resect worse over the inferior aspect of the disc space  laterally in the right L1-2 level. Careful inspection of the neuroforamen of a ball-tipped probe as well as a hockey-stick nerve probe demonstrated neuroforamen to be free and the L1 nerve root exiting without further compression following the resection of the small amount of disc material and entry into the disc material.  The nerve root was freed from pressure posteriorly this area by resecting portion of the intertransverse ligament as it pressed nerve forward against this protruded disc.  Irrigation was then carried out using copious amounts of irrigant solution. Minimal bleeding is present felt to represent bleeding from the disc itself. There was no active bleeding that would be cauterized her otherwise. In careful then carefully the MIS retractors were then removed exploring the incision with its removal no active bleeders were noted. Incision then closed using a UR 6-0 Vicryl suture to the lumbodorsal fascia and subcutaneous layers. Superficial layers of proximal with interrupted 2-0 Vicryl sutures and the skin closed with a running subcutaneous stitch of 4-0 Vicryl Dermabond was applied then MedPlex bandage. All instrument and sponge counts were correct.  The patient was then turned carefully to a supine position on a second OR table with a right arm board. Time out protocal was carried out and correct for his right wrist excision of ganglion cyst. Tourniquet was placed about the right upper arm. Right upper extremity was exsanguinated with an Esmarch bandage and tourniquet inflated to 250 mmHg. Incision was longitudinally oriented over the right  radius in line with the ganglion cyst that the stent along the radial aspect of the right wrist proximally about 2-1/2 inches. Incision through skin and then subcutaneous layers were developed using even scissors down to the ganglion cyst this was then freed up both proximally and distally identifying the radial artery and the venous  convitantes proximally.  Ganglion cyst was then freed distally and as this was done it was apparent that the ganglion cyst was scarred into the superficial surface of the vascular bundle. The cyst was then opened and decompressed. Continued dissection showed the cyst to then extended distally and then deep to the radial vascular bundle. The cyst was followed along the radial aspect of the radial vessels as it continued to deep to the blood vessels and then more ulnar ward to form a stalk that inserted onto the volar capsule of the radial carpal joint just radial to the flexor carpi radialis tendon. The stalk and a window of upsloping was then removed over this area using a 15 blade scalpel. Liters were controlled using bipolar electrocautery. Tourniquet was released at 45 minutes. Tourniquet was removed in order to decrease venous congestion and further cauterization was carried out then irrigation of the area of the right distal radial forearm. All electrocautery used to control small venous bleeders. Bleeding was controlled then the incision was closed using interrupted 3-0 Vicryl sutures to the subcutaneous layer. A rubber band drain was placed sutured with a 4-0 nylon stitch to exit out just beneath the dressing with a thumb. The subcutaneous layers and skin were then approximated with mattress sutures of 4-0 nylon. Xeroform gauze by fours and skin with sterile web roll and then a well-padded volar distal forearm splint was applied using plaster repairs. Fingers then opened for range of motion at the MCP joint. Patient was then reactivated extubated and returned to recovery room in satisfactory condition.  Benjiman Core, PA-C perform the duties of assistant surgeon during this case. He was present from the beginning of the case to the end of the case assisting in transfer the patient from his stretcher to the OR table then to a second OR table transfer and back to the stretcher at the end of the case. He assisted in careful retraction  and suction of the laminectomy site delicate neural structures operating under the operating room microscope. He performed closure of the incision both the right paramedian lumbar incision and the right radial wrist incision from the fascia to the skin applying the dressings and the right forearm splint.         NITKA,JAMES E  09/20/2015, 11:54 AM

## 2015-09-20 NOTE — Brief Op Note (Signed)
PATIENT ID:      [name] [name]  MRN:     [id number] DOB/AGE:    10-15-1950 / 65 y.o.       OPERATIVE REPORT   DATE OF PROCEDURE:  09/20/2015      PREOPERATIVE DIAGNOSIS:   Right L1-2 and L2-3 herniated nucleus pulposus, right volar radial wrist ganglion cyst                                                       Body mass index is 25.24 kg/(m^2).    POSTOPERATIVE DIAGNOSIS:   * No post-op diagnosis entered *                                                                     Body mass index is 25.24 kg/(m^2).    PROCEDURE:  Procedure(s): FAR LATERAL APPROACH TO EXCISE HERNIATED NUCLEUS PULPOSUS RIGHT L1-2 AND RIGHT L2-3, EXCISION OF GANGLION CYST RIGHT VOLAR RADIAL WRIST REMOVAL GANGLION OF WRIST    SURGEON: Oronde Hallenbeck [name]   ASSISTANT: Esaw Grandchild          ANESTHESIA:  General supplemented with local marcaine 0.5% 1:1 exparel 1.3% total 20cc, Dr. Orene Desanctis.  EBL:75cc  DRAINS:Rubber band  TOURNIQUET TIME:45 min @ 260mm Hg.  COMPLICATIONS:  None   CONDITION:  stable    [name] [name] 09/20/2015, 7:36 AM

## 2015-09-20 NOTE — Anesthesia Preprocedure Evaluation (Addendum)
Anesthesia Evaluation  Patient identified by MRN, date of birth, ID band Patient awake    Reviewed: Allergy & Precautions, NPO status   History of Anesthesia Complications (+) history of anesthetic complications  Airway Mallampati: II       Dental  (+) Dental Advisory Given, Partial Upper   Pulmonary asthma , sleep apnea ,    breath sounds clear to auscultation       Cardiovascular hypertension,  Rhythm:Regular Rate:Normal  Hx of moderatlely decreased EF   Neuro/Psych    GI/Hepatic GERD  ,  Endo/Other    Renal/GU Renal InsufficiencyRenal disease     Musculoskeletal  (+) Arthritis ,   Abdominal   Peds  Hematology   Anesthesia Other Findings   Reproductive/Obstetrics                           Anesthesia Physical Anesthesia Plan  ASA: III  Anesthesia Plan: General   Post-op Pain Management:    Induction: Intravenous  Airway Management Planned: Oral ETT  Additional Equipment:   Intra-op Plan:   Post-operative Plan: Extubation in OR  Informed Consent: I have reviewed the patients History and Physical, chart, labs and discussed the procedure including the risks, benefits and alternatives for the proposed anesthesia with the patient or authorized representative who has indicated his/her understanding and acceptance.   Dental advisory given  Plan Discussed with: CRNA and Surgeon  Anesthesia Plan Comments:         Anesthesia Quick Evaluation

## 2015-09-21 ENCOUNTER — Encounter (HOSPITAL_COMMUNITY): Payer: Self-pay | Admitting: Specialist

## 2015-09-21 LAB — CBC
HEMATOCRIT: 36 % — AB (ref 39.0–52.0)
HEMOGLOBIN: 12.3 g/dL — AB (ref 13.0–17.0)
MCH: 30.4 pg (ref 26.0–34.0)
MCHC: 34.2 g/dL (ref 30.0–36.0)
MCV: 88.9 fL (ref 78.0–100.0)
Platelets: 145 10*3/uL — ABNORMAL LOW (ref 150–400)
RBC: 4.05 MIL/uL — AB (ref 4.22–5.81)
RDW: 13.2 % (ref 11.5–15.5)
WBC: 10.8 10*3/uL — ABNORMAL HIGH (ref 4.0–10.5)

## 2015-09-21 LAB — BASIC METABOLIC PANEL
Anion gap: 8 (ref 5–15)
BUN: 15 mg/dL (ref 6–20)
CHLORIDE: 106 mmol/L (ref 101–111)
CO2: 21 mmol/L — AB (ref 22–32)
CREATININE: 1 mg/dL (ref 0.61–1.24)
Calcium: 8.2 mg/dL — ABNORMAL LOW (ref 8.9–10.3)
GFR calc Af Amer: >60 mL/min (ref >60)
GFR calc non Af Amer: >60 mL/min (ref >60)
Glucose, Bld: 146 mg/dL — ABNORMAL HIGH (ref 65–99)
POTASSIUM: 4.2 mmol/L (ref 3.5–5.1)
Sodium: 135 mmol/L (ref 135–145)

## 2015-09-21 MED ORDER — OXYCODONE-ACETAMINOPHEN 5-325 MG PO TABS: 1.0000 | ORAL_TABLET | ORAL | Status: DC | PRN

## 2015-09-21 MED ORDER — CYCLOBENZAPRINE HCL 10 MG PO TABS: 10.0000 mg | ORAL_TABLET | Freq: Two times a day (BID) | ORAL | Status: DC | PRN

## 2015-09-21 MED FILL — Thrombin For Soln 20000 Unit: CUTANEOUS | Qty: 1 | Status: AC

## 2015-09-21 NOTE — Care Management Important Message (Signed)
Important Message  Patient Details  Name: [name] [name] MRN: [id number] Date of Birth: November 14, 1950   Medicare Important Message Given:  Yes    Loann Quill 09/21/2015, 8:49 AM

## 2015-09-21 NOTE — Progress Notes (Signed)
Pt ready for discharge. Education/instructions reviewed with pt and all questions/concerns addressed. IV removed and belongings gathered. Pt will be transported out via wheelchair to sister's car. Will continue to monitor.  

## 2015-09-21 NOTE — Progress Notes (Signed)
     Subjective: 1 Day Post-Op Procedure(s) (LRB): FAR LATERAL APPROACH TO EXCISE HERNIATED NUCLEUS PULPOSUS RIGHT Lumbar 1- Lumbar 2 AND RIGHT Lumbar 2-Lumbar 3, EXCISION OF GANGLION CYST RIGHT VOLAR RADIAL WRIST (Right) REMOVAL GANGLION OF WRIST (Right) Awake, alert and oriented x 4. Right thumb with some numbness resolved. Legs without numbness. Patient reports pain as moderate.    Objective:   VITALS:  Temp:  [97.4 F (36.3 C)-98.1 F (36.7 C)] 97.5 F (36.4 C) (06/13 0053) Pulse Rate:  [70-80] 76 (06/13 0500) Resp:  [13-20] 16 (06/13 0500) BP: (75-121)/(46-86) 112/66 mmHg (06/13 0500) SpO2:  [95 %-99 %] 99 % (06/13 0500)  Neurologically intact ABD soft Neurovascular intact Sensation intact distally Intact pulses distally Dorsiflexion/Plantar flexion intact Incision: dressing C/D/I and no drainage No cellulitis present Rubber band removed this am   LABS  Recent Labs  09/21/15 0534  HGB 12.3*  WBC 10.8*  PLT 145*    Recent Labs  09/21/15 0534  NA 135  K 4.2  CL 106  CO2 21*  BUN 15  CREATININE 1.00  GLUCOSE 146*   No results for input(s): LABPT, INR in the last 72 hours.   Assessment/Plan: 1 Day Post-Op Procedure(s) (LRB): FAR LATERAL APPROACH TO EXCISE HERNIATED NUCLEUS PULPOSUS RIGHT Lumbar 1- Lumbar 2 AND RIGHT Lumbar 2-Lumbar 3, EXCISION OF GANGLION CYST RIGHT VOLAR RADIAL WRIST (Right) REMOVAL GANGLION OF WRIST (Right)  Advance diet Up with therapy D/C IV fluids Discharge home with home health  Morton Simson E 09/21/2015, 7:31 AM

## 2015-09-21 NOTE — Evaluation (Signed)
Physical Therapy Evaluation/Discharge  Patient Details Name: [name] [name] MRN: [id number] DOB: 09/20/1950 Today's Date: 09/21/2015   History of Present Illness  65 y.o. male now s/p FAR LATERAL APPROACH TO EXCISE HERNIATED NUCLEUS PULPOSUS RIGHT Lumbar 1- Lumbar 2 AND RIGHT Lumbar 2-Lumbar 3, EXCISION OF GANGLION CYST RIGHT VOLAR RADIAL WRIST. PMH cardimyopathy, anxiety, asthma, bilateral rotator cuff tears  Clinical Impression  Patient evaluated by Physical Therapy with no further acute PT needs identified. All education has been completed and the patient has no further questions. See below for any follow-up Physical Therapy or equipment needs. Pt denies any questions or concerns. PT is signing off. Thank you for this referral.     Follow Up Recommendations No PT follow up    Equipment Recommendations  None recommended by PT    Recommendations for Other Services       Precautions / Restrictions Precautions Precautions: Back Precaution Booklet Issued: Yes (comment) Precaution Comments: precaution sheet provided, reviewed back precautions Restrictions Weight Bearing Restrictions: No      Mobility  Bed Mobility Overal bed mobility: Independent             General bed mobility comments: reviewed logroll, pt able to perform  Transfers Overall transfer level: Independent Equipment used: None                Ambulation/Gait Ambulation/Gait assistance: Independent Ambulation Distance (Feet): 400 Feet Assistive device: None Gait Pattern/deviations: WFL(Within Functional Limits)   Gait velocity interpretation: at or above normal speed for age/gender General Gait Details: no instability or loss of balance  Stairs Stairs: Yes Stairs assistance: Modified independent (Device/Increase time) Stair Management: One rail Right;Alternating pattern;Forwards Number of Stairs: 5 General stair comments: safe and stable technique demonstrated.   Wheelchair Mobility     Modified Rankin (Stroke Patients Only)       Balance Overall balance assessment: No apparent balance deficits (not formally assessed)                                           Pertinent Vitals/Pain Pain Assessment: 0-10 Pain Score: 5  Pain Location: back Pain Descriptors / Indicators: Aching Pain Intervention(s): Limited activity within patient's tolerance;Monitored during session    [place] expects to be discharged to:: Private residence Living Arrangements: Alone Available Help at Discharge: Family;Available PRN/intermittently Type of Home: House Home Access: Stairs to enter Entrance Stairs-Rails: Right Entrance Stairs-Number of Steps: 2 Home Layout: One level Home Equipment: None Additional Comments: sister lives next door and will check on him    Prior Function Level of Independence: Independent               Hand Dominance        Extremity/Trunk Assessment               Lower Extremity Assessment: Overall WFL for tasks assessed      Cervical / Trunk Assessment: Normal  Communication   Communication: No difficulties  Cognition Arousal/Alertness: Awake/alert Behavior During Therapy: WFL for tasks assessed/performed Overall Cognitive Status: Within Functional Limits for tasks assessed                      General Comments      Exercises        Assessment/Plan    PT Assessment Patent does not need any further PT services  PT Diagnosis  PT Problem List    PT Treatment Interventions     PT Goals (Current goals can be found in the Care Plan section) Acute Rehab PT Goals Patient Stated Goal: go home PT Goal Formulation: With patient Time For Goal Achievement: 09/26/15 Potential to Achieve Goals: Good    Frequency     Barriers to discharge        Co-evaluation               End of Session Equipment Utilized During Treatment: Gait belt Activity Tolerance: Patient tolerated  treatment well Patient left: in bed;with call bell/phone within reach Nurse Communication: Mobility status         Time: [id number] PT Time Calculation (min) (ACUTE ONLY): 16 min   Charges:   PT Evaluation $PT Eval Low Complexity: 1 Procedure     PT G Codes:        Cassell Clement, PT, CSCS Pager [phone] Office [phone]  09/21/2015, 11:34 AM

## 2015-09-21 NOTE — Evaluation (Addendum)
Occupational Therapy Evaluation and Discharge Patient Details Name: [name] [name] MRN: [id number] DOB: 1950-12-16 Today's Date: 09/21/2015    History of Present Illness 65 y.o. male now s/p FAR LATERAL APPROACH TO EXCISE HERNIATED NUCLEUS PULPOSUS RIGHT Lumbar 1- Lumbar 2 AND RIGHT Lumbar 2-Lumbar 3, EXCISION OF GANGLION CYST RIGHT VOLAR RADIAL WRIST. PMH cardimyopathy, anxiety, asthma, bilateral rotator cuff tears   Clinical Impression   Pt is functioning at a modified independent level in ADL and mobility. All education completed and pt verbalized understanding. No further OT needs.    Follow Up Recommendations  No OT follow up    Equipment Recommendations  None recommended by OT    Recommendations for Other Services       Precautions / Restrictions Precautions Precautions: Back Precaution Booklet Issued: Yes (comment) Precaution Comments: reviewed back precautions related to ADL and IADL Restrictions Weight Bearing Restrictions: No      Mobility Bed Mobility Overal bed mobility: Modified Independent             General bed mobility comments: reviewed logroll, pt able to perform  Transfers Overall transfer level: Independent Equipment used: None                  Balance Overall balance assessment: Independent                                          ADL Overall ADL's : Modified independent                                       General ADL Comments: Educated at length in back precautions related to ADL and IADL.     Vision     Perception     Praxis      Pertinent Vitals/Pain Pain Assessment: Faces Pain Score: 5  Faces Pain Scale: Hurts little more Pain Location: back Pain Descriptors / Indicators: Sore Pain Intervention(s): Monitored during session;Repositioned     Hand Dominance Right   Extremity/Trunk Assessment Upper Extremity Assessment Upper Extremity Assessment: RUE deficits/detail RUE  Deficits / Details: splint from MPs to forearm RUE: Unable to fully assess due to immobilization RUE Coordination: decreased fine motor  Instructed in edema management techniques.   Lower Extremity Assessment Lower Extremity Assessment: Defer to PT evaluation   Cervical / Trunk Assessment Cervical / Trunk Assessment: Normal   Communication Communication Communication: No difficulties   Cognition Arousal/Alertness: Awake/alert Behavior During Therapy: WFL for tasks assessed/performed Overall Cognitive Status: Within Functional Limits for tasks assessed                     General Comments       Exercises       Shoulder Instructions      Home Living Family/patient expects to be discharged to:: Private residence Living Arrangements: Alone Available Help at Discharge: Family;Available PRN/intermittently Type of Home: House Home Access: Stairs to enter [employer] of Steps: 2 Entrance Stairs-Rails: Right Home Layout: One level     Bathroom Shower/Tub: Teacher, early years/pre: Standard     Home Equipment: Hand held shower head   Additional Comments: sister lives next door and will check on him      Prior Functioning/Environment Level of Independence: Independent  OT Diagnosis: Generalized weakness;Acute pain   OT Problem List:     OT Treatment/Interventions:      OT Goals(Current goals can be found in the care plan section) Acute Rehab OT Goals Patient Stated Goal: go home  OT Frequency:     Barriers to D/C:            Co-evaluation              End of Session Nurse Communication:  (pt ready for d/c)  Activity Tolerance: Patient tolerated treatment well Patient left: in bed;with call bell/phone within reach   Time: 1135-1156 OT Time Calculation (min): 21 min Charges:  OT General Charges $OT Visit: 1 Procedure OT Evaluation $OT Eval Low Complexity: 1 Procedure G-Codes:    Malka So 09/21/2015, 12:01 PM [phone]

## 2015-09-28 NOTE — Discharge Summary (Signed)
Physician Discharge Summary      Patient ID: [name] [name] MRN: [id number] DOB/AGE: 1950-06-27 65 y.o.  Admit date: 09/20/2015 Discharge date: 09/21/2015  Admission Diagnoses:  Principal Problem:   Lumbar disc herniation with radiculopathy Active Problems:   Ganglion cyst of wrist   Discharge Diagnoses:  Same  Past Medical History  Diagnosis Date  . Nonischemic cardiomyopathy ([place])     [place] 06/2002 with normal cors; Myoview 11/08 neg for ischemia. Echo 4/08: EF of 35-40%.  Cause of NICM unknown.  Never a heavy drinker, used cocaine or amphetamines. HIV, SPEP, and ferritin/Fe studies were all negative in 12/09. Myoview (5/11): EF 42% with apical thinning but no evidence for ischemia or infarction. Echo (3/12): EF 45-50%, no significant valvular abnormalities.   . Hypertriglyceridemia   . Rotator cuff tendonitis     with hx of bilateral shoulder surgery  . HTN (hypertension)   . Knee osteoarthritis     s/p arhtroscopy 2010  . GERD (gastroesophageal reflux disease)     EGD 1/12 with erosive esophagitis and gastritis, biopsy postiive for H pylori (being  treated)  . Allergic rhinitis   . Gynecomastia     with spironolactone (ok on eplerenone)  . History of back surgery   . Diverticulosis     a. Colonoscopy (1/12) with mild diverticulosis and hemorrhoids  . Asthma     Probable mild intermittent asthma  . Elevated LFTs     HCV negative, possibly due to ETOH  . CKD (chronic kidney disease)   . Sleep apnea     doesnot use cpap  . Anxiety     Surgeries: Procedure(s): FAR LATERAL APPROACH TO EXCISE HERNIATED NUCLEUS PULPOSUS RIGHT Lumbar 1- Lumbar 2 AND RIGHT Lumbar 2-Lumbar 3, EXCISION OF GANGLION CYST RIGHT VOLAR RADIAL WRIST REMOVAL GANGLION OF WRIST on 09/20/2015   Consultants:    Discharged Condition: Improved  Hospital Course: KEENEN HICKLING is an 65 y.o. male who was admitted 09/20/2015 with a chief complaint of No chief complaint on file. , and found to have a  diagnosis of Lumbar disc herniation with radiculopathy.  He was brought to the operating room on 09/20/2015 and underwent the above named procedures.    He was given perioperative antibiotics:  Anti-infectives    Start     Dose/Rate Route Frequency Ordered Stop   09/20/15 1530  ceFAZolin (ANCEF) IVPB 1 g/50 mL premix     1 g 100 mL/hr over 30 Minutes Intravenous Every 8 hours 09/20/15 1327 09/20/15 2347   09/19/15 1149  ceFAZolin (ANCEF) IVPB 2g/100 mL premix  Status:  Discontinued     2 g 200 mL/hr over 30 Minutes Intravenous On call to O.R. 09/19/15 1149 09/20/15 1327   09/19/15 1149  ceFAZolin (ANCEF) IVPB 2g/100 mL premix  Status:  Discontinued     2 g 200 mL/hr over 30 Minutes Intravenous On call to O.R. 09/19/15 1149 09/20/15 1327    POD#1 foley was discontinued and he was able to void without difficulty. Hgb and Hct was stable. PT and OT worked with the patient and appriased him to be ready for discharge. Dressing changed and he Was alert and oriented and tolerating po pain control medications and po nourishment. He was discharged home on POD#1.  He was given sequential compression devices, early ambulation, and chemoprophylaxis for DVT prophylaxis.  He benefited maximally from their hospital stay and there were no complications.    Recent vital signs:  Filed Vitals:   09/21/15 0053  09/21/15 0500  BP: 75/55 112/66  Pulse: 78 76  Temp: 97.5 F (36.4 C)   Resp: 16 16    Recent laboratory studies:  Results for orders placed or performed during the hospital encounter of 09/20/15  CBC  Result Value Ref Range   WBC 10.8 (H) 4.0 - 10.5 K/uL   RBC 4.05 (L) 4.22 - 5.81 MIL/uL   Hemoglobin 12.3 (L) 13.0 - 17.0 g/dL   HCT 36.0 (L) 39.0 - 52.0 %   MCV 88.9 78.0 - 100.0 fL   MCH 30.4 26.0 - 34.0 pg   MCHC 34.2 30.0 - 36.0 g/dL   RDW 13.2 11.5 - 15.5 %   Platelets 145 (L) 150 - 400 K/uL  Basic Metabolic Panel  Result Value Ref Range   Sodium 135 135 - 145 mmol/L   Potassium  4.2 3.5 - 5.1 mmol/L   Chloride 106 101 - 111 mmol/L   CO2 21 (L) 22 - 32 mmol/L   Glucose, Bld 146 (H) 65 - 99 mg/dL   BUN 15 6 - 20 mg/dL   Creatinine, Ser 1.00 0.61 - 1.24 mg/dL   Calcium 8.2 (L) 8.9 - 10.3 mg/dL   GFR calc non Af Amer >60 >60 mL/min   GFR calc Af Amer >60 >60 mL/min   Anion gap 8 5 - 15    Discharge Medications:     Medication List    STOP taking these medications        BC FAST PAIN RELIEF ARTHRITIS PO     GINSENG PO      TAKE these medications        aspirin 81 MG tablet  Take 1 tablet (81 mg total) by mouth daily.     atorvastatin 40 MG tablet  Commonly known as:  LIPITOR  Take 1 tablet (40 mg total) by mouth daily.     carvedilol 12.5 MG tablet  Commonly known as:  COREG  Take 1 tablet (12.5 mg total) by mouth 2 (two) times daily.     cyclobenzaprine 10 MG tablet  Commonly known as:  FLEXERIL  Take 1 tablet (10 mg total) by mouth 2 (two) times daily as needed for muscle spasms.     eplerenone 25 MG tablet  Commonly known as:  INSPRA  Take 1 tablet (25 mg total) by mouth daily.     fenofibrate 54 MG tablet  TAKE ONE TABLET BY MOUTH ONCE DAILY     oxyCODONE-acetaminophen 5-325 MG tablet  Commonly known as:  PERCOCET/ROXICET  Take 1-2 tablets by mouth every 4 (four) hours as needed for moderate pain.     sacubitril-valsartan 24-26 MG  Commonly known as:  ENTRESTO  Take 1 tablet by mouth 2 (two) times daily.        Diagnostic Studies: Dg Chest 2 View  09/13/2015  CLINICAL DATA:  Pre back surgery evaluation. EXAM: CHEST  2 VIEW COMPARISON:  12/10/2011. FINDINGS: Normal sized heart. Clear lungs. Faint bilateral nipple shadows. Mild thoracic spine degenerative changes and mild scoliosis. IMPRESSION: No acute abnormality. Electronically Signed   By: Claudie Revering M.D.   On: 09/13/2015 17:14   Dg Lumbar Spine 2-3 Views  09/20/2015  CLINICAL DATA:  Surgery: FAR LATERAL APPROACH TO EXCISE HERNIATED NUCLEUS PULPOSUS RIGHT Lumbar 1- Lumbar 2 AND  RIGHT Lumbar 2-Lumbar 3, (Localization Films) EXAM: LUMBAR SPINE - 2-3 VIEW COMPARISON:  05/03/2015 FINDINGS: Numbering is based on previous MRI. FILM #1: Localizing instruments are identified posterior to lumbar spine. The  most superior is posterior to the vertebral body of L3. The more inferior is posterior to the vertebral body at L4. FILM #2: Localizing instrument is identified posterior to vertebral body of L2. IMPRESSION: Intraoperative localization. Electronically Signed   By: Nolon Nations M.D.   On: 09/20/2015 12:24    Disposition: 01-Home or Self Care      Discharge Instructions    Call MD / Call 911    Complete by:  As directed   If you experience chest pain or shortness of breath, CALL 911 and be transported to the hospital emergency room.  If you develope a fever above 101 F, pus (white drainage) or increased drainage or redness at the wound, or calf pain, call your surgeon's office.     Constipation Prevention    Complete by:  As directed   Drink plenty of fluids.  Prune juice may be helpful.  You may use a stool softener, such as Colace (over the counter) 100 mg twice a day.  Use MiraLax (over the counter) for constipation as needed.     Diet - low sodium heart healthy    Complete by:  As directed      Discharge instructions    Complete by:  As directed   Ok to shower 5 days postop.  Do not apply any creams or ointments to incision.  Do not remove steri-strips.  Can use 4x4 gauze and tape for dressing changes.  No aggressive activity.  No bending, squatting or prolonged sitting.  Mostly be in reclined position or lying down.  Ok to do some walking, but nothing excessive.     Driving restrictions    Complete by:  As directed   No driving     Increase activity slowly as tolerated    Complete by:  As directed      Lifting restrictions    Complete by:  As directed   No lifting           Follow-up Information    Follow up with Laurabeth Yip E, MD In 2 weeks.   Specialty:   Orthopedic Surgery   Why:  For wound re-check   Contact information:   [place] [address] [phone]        Signed: Jessy Oto 09/28/2015, 12:36 PM

## 2015-10-06 ENCOUNTER — Telehealth: Payer: Self-pay | Admitting: Specialist

## 2015-10-06 NOTE — Telephone Encounter (Signed)
[facility] - Client  [facility]     Patient Name: [facility] Client [facility] - Client    Client Site [facility] - Day    Physician AA - PHYSICIAN, UNKNOWN- MD    Contact Type Call    Who Is Calling Patient / Member / Family / Caregiver    Call Type Triage / Clinical    Relationship To Patient Self    Return Phone Number [phone] (Primary)    Chief Complaint Dizziness  Gender: Male Reason for Call Symptomatic / Request for Health Information  DOB: Dec 26, 1950  Initial Comment Caller states discharged from hospital two weeks ago for back surgery. Having spells where all the sudden he needs to lay down. When he stands up he gets dizzy and nauseous. Can't stand up long. Bp was low yesterday 94/62. Has a heart condition  Age: 65 Y 11 M 22 D Appointment Disposition EMR Appointment Not Necessary  Return Phone Number: [phone] (Primary) Info pasted into Epic Yes  Address:  [facility]  City/State/Zip: [place] [address] Translation No    Nurse Assessment  Nurse: Venetia Maxon, RN, Manuela Schwartz Date/Time (Eastern Time): 10/06/2015 3:14:36 PM  Confirm and document reason for call. If symptomatic, describe symptoms. You must click the next button to save text entered. ---Caller states discharged from hospital two weeks ago for back surgery. Having spells where all the sudden he needs to lay down. When he stands up he gets dizzy and nauseous. Can't stand up long. Bp was low yesterday 94/62. Has a heart condition . He does not check his B/P at home. He had these episodes before working in the summer./ This past weekend he had the spells again. He takes Coreg, [place] other meds CHF> He has urinated. He is not on diuretic . On steroids and percocets ( for healing for back ) . no fever  Has the patient traveled out of the country within the last 30 days? ---No  Does the patient have any new  or worsening symptoms? ---Yes  Will a triage be completed? ---Yes  Related visit to physician within the last 2 weeks? ---No  Does the PT have any chronic conditions? (i.e. diabetes, asthma, etc.) ---Yes  List chronic conditions. ---Heart pt has CHF  Is this a behavioral health or substance abuse call? ---No    Guidelines      Guideline Title Affirmed Question Affirmed Notes Nurse Date/Time (Eastern Time)  Dizziness - Lightheadedness SEVERE dizziness (e.g., unable to stand, requires support to walk, feels like passing out now)  Venetia Maxon, Mining engineer 10/06/2015 3:19:33 PM  Disp. Time Eilene Ghazi Time) Disposition Final User         10/06/2015 3:22:52 PM Go to ED Now (or PCP triage) Yes Venetia Maxon, RN, Edwena Bunde Understands: Yes   Disagree/Comply: Comply      Care Advice Given Per Guideline         GO TO ED NOW (OR PCP TRIAGE): DRIVING: Another adult should drive. CARE ADVICE given per Dizziness (Adult) guideline. * Please bring a list of your current medicines when you go to the Emergency Department (ER). BRING MEDICINES:             Referrals   [facility] - ED

## 2015-10-11 ENCOUNTER — Ambulatory Visit: Payer: Medicare Other | Admitting: Family

## 2015-10-11 DIAGNOSIS — Z0289 Encounter for other administrative examinations: Secondary | ICD-10-CM

## 2015-10-19 ENCOUNTER — Other Ambulatory Visit (HOSPITAL_COMMUNITY): Payer: Self-pay | Admitting: *Deleted

## 2015-10-19 MED ORDER — EPLERENONE 25 MG PO TABS: 25.0000 mg | ORAL_TABLET | Freq: Every day | ORAL | Status: DC

## 2015-11-02 ENCOUNTER — Telehealth (HOSPITAL_COMMUNITY): Payer: Self-pay | Admitting: Pharmacist

## 2015-11-02 NOTE — Telephone Encounter (Signed)
[employer] patient assistance approved for Inspra. Patient will receive medication at no charge to him through 10/29/16.   Ruta Hinds. Velva Harman, PharmD, BCPS, CPP Clinical Pharmacist Pager: [phone] Phone: [phone] 11/02/2015 12:31 PM

## 2015-11-11 ENCOUNTER — Telehealth (HOSPITAL_COMMUNITY): Payer: Self-pay | Admitting: Vascular Surgery

## 2015-11-11 NOTE — Telephone Encounter (Signed)
Pt needs Inspra ordered pt states he is almost out

## 2015-11-12 NOTE — Telephone Encounter (Signed)
Received pt's Inspra from [employer], pt is aware and will pick up today

## 2016-01-17 ENCOUNTER — Other Ambulatory Visit (HOSPITAL_COMMUNITY): Payer: Self-pay | Admitting: *Deleted

## 2016-01-17 MED ORDER — SACUBITRIL-VALSARTAN 24-26 MG PO TABS: 1.0000 | ORAL_TABLET | Freq: Two times a day (BID) | ORAL | 6 refills | Status: DC

## 2016-02-09 ENCOUNTER — Other Ambulatory Visit (HOSPITAL_COMMUNITY): Payer: Self-pay

## 2016-02-09 MED ORDER — EPLERENONE 25 MG PO TABS: 25.0000 mg | ORAL_TABLET | Freq: Every day | ORAL | 3 refills | Status: DC

## 2016-02-25 ENCOUNTER — Other Ambulatory Visit (HOSPITAL_COMMUNITY): Payer: Self-pay

## 2016-03-06 ENCOUNTER — Telehealth (INDEPENDENT_AMBULATORY_CARE_PROVIDER_SITE_OTHER): Payer: Self-pay | Admitting: Specialist

## 2016-03-06 NOTE — Telephone Encounter (Signed)
Patient requesting refill of tramadol and flexiril. Please call pt when ready for pick up.

## 2016-03-06 NOTE — Telephone Encounter (Signed)
See below, please advise on rx Thanks.

## 2016-03-07 MED ORDER — TRAMADOL HCL 50 MG PO TABS: 50.0000 mg | ORAL_TABLET | Freq: Four times a day (QID) | ORAL | 0 refills | Status: DC | PRN

## 2016-03-07 MED ORDER — CYCLOBENZAPRINE HCL 10 MG PO TABS: 10.0000 mg | ORAL_TABLET | Freq: Two times a day (BID) | ORAL | 0 refills | Status: DC | PRN

## 2016-03-08 NOTE — Telephone Encounter (Signed)
Noted, called in tramadol to [facility] in [place] Thanks.

## 2016-03-09 ENCOUNTER — Other Ambulatory Visit (INDEPENDENT_AMBULATORY_CARE_PROVIDER_SITE_OTHER): Payer: Self-pay | Admitting: Specialist

## 2016-03-09 NOTE — Telephone Encounter (Signed)
Musiq called back saying he just spoke to a Representative at the [facility] on [address] and was told they've not received a Rx for Tramadol. He's wondering if this can be taken care of today. He was also told that the pharmacy can't request and transfer of the medication Rx from [place] to the location in [place]. Please give him a call regarding this.  Pt's ph# [phone]  Thank you.

## 2016-03-09 NOTE — Telephone Encounter (Signed)
Called [facility] on [address] and gave rx over the phone for tramadol. [place] and advise patient is to pick this up at the [facility] location

## 2016-03-09 NOTE — Telephone Encounter (Signed)
Called [facility] on [address] and gave rx over the phone for tramadol. [place] and advise patient is to pick this up at the [facility] location

## 2016-03-09 NOTE — Telephone Encounter (Signed)
Patient requesting his tramadol medication be sent to the [facility] on [address] instead of the [facility] one.

## 2016-03-09 NOTE — Telephone Encounter (Signed)
[facility]ed [facility] [address] and gave rx over the phone for tramadol. [place] and advise patient is to pick this up at the [facility] location

## 2016-03-13 ENCOUNTER — Other Ambulatory Visit (HOSPITAL_COMMUNITY): Payer: Self-pay | Admitting: *Deleted

## 2016-03-13 ENCOUNTER — Other Ambulatory Visit: Payer: Self-pay | Admitting: Cardiology

## 2016-03-13 MED ORDER — CARVEDILOL 12.5 MG PO TABS: 12.5000 mg | ORAL_TABLET | Freq: Two times a day (BID) | ORAL | 3 refills | Status: DC

## 2016-03-20 ENCOUNTER — Other Ambulatory Visit (HOSPITAL_COMMUNITY): Payer: Self-pay | Admitting: *Deleted

## 2016-03-20 MED ORDER — EPLERENONE 25 MG PO TABS: 25.0000 mg | ORAL_TABLET | Freq: Every day | ORAL | 3 refills | Status: DC

## 2016-03-20 NOTE — Telephone Encounter (Signed)
Patient called asking for a Refill on his Inspra medication and that [facility] does not have any more refills for him.  Patient reported he has been out of medication for 2 weeks now.    Refill was sent [facility] in November instead of of [facility]. Erika sent a new prescription to [facility] today.    Called patient and left detailed voicemail stating all of the above.

## 2016-04-04 ENCOUNTER — Telehealth (HOSPITAL_COMMUNITY): Payer: Self-pay | Admitting: *Deleted

## 2016-04-04 NOTE — Telephone Encounter (Signed)
Pt called stating he is out of Inspra and needs to get it from [facility], called [employer] and ordered med, confirmation is [id number], pt aware we will call him when it arrives.

## 2016-04-05 ENCOUNTER — Telehealth (INDEPENDENT_AMBULATORY_CARE_PROVIDER_SITE_OTHER): Payer: Self-pay | Admitting: Specialist

## 2016-04-05 NOTE — Telephone Encounter (Signed)
Pt is requesting oxycodone. Stated that tramadol isn't working and needs something stronger as he is very stiff. Pt number is [phone]

## 2016-04-06 NOTE — Telephone Encounter (Signed)
Please advise 

## 2016-04-06 NOTE — Telephone Encounter (Signed)
I am not prescribing oxycodone for this patient's condition, it does nothing to make his spine better and makes him Dependent on narcotics. [name] [name]

## 2016-04-07 NOTE — Telephone Encounter (Signed)
Patient aware of the below message from Nitka 

## 2016-04-12 NOTE — Telephone Encounter (Signed)
Pt's Inspra arrived today from [facility], pt aware and will p/u

## 2016-04-24 ENCOUNTER — Other Ambulatory Visit (INDEPENDENT_AMBULATORY_CARE_PROVIDER_SITE_OTHER): Payer: Self-pay | Admitting: Specialist

## 2016-04-24 ENCOUNTER — Telehealth (INDEPENDENT_AMBULATORY_CARE_PROVIDER_SITE_OTHER): Payer: Self-pay | Admitting: Specialist

## 2016-04-24 MED ORDER — TRAMADOL HCL 50 MG PO TABS: ORAL_TABLET | ORAL | 0 refills | Status: DC

## 2016-04-24 MED ORDER — PREGABALIN 75 MG PO CAPS: 75.0000 mg | ORAL_CAPSULE | Freq: Two times a day (BID) | ORAL | 3 refills | Status: DC

## 2016-04-24 NOTE — Telephone Encounter (Signed)
Patient called needing Rx refilled (tramadol) Also, patient asked if he can get a Rx refilled for Lyrica. Patient  said he gets the prescrpition through [facility]. The ID # is [id number]. The number to [facility] is [phone]. Patient said the Rx is written for 3 refilled. Patient said he get the Lyrica every 90 days

## 2016-04-24 NOTE — Telephone Encounter (Signed)
Patient called back advised the Rx for Lyrica need to be faxed into [facility]. The fax# is [phone]. The ph# is [phone].

## 2016-04-24 NOTE — Telephone Encounter (Signed)
Rx for tramadol and lyrica printed and signed. Rx for lyrica to go to [facility].

## 2016-04-24 NOTE — Telephone Encounter (Signed)
Patient called needing Rx refilled (tramadol) Also, patient asked if he can get a Rx refilled for Lyrica. Patient  said he gets the prescrpition through [facility]. The ID # is [id number]. The number to [place] is [phone]. Patient said the Rx is written for 3 refilled. Patient said he get the Lyrica every 90 days. The number to contact patient is [phone]

## 2016-04-25 NOTE — Progress Notes (Signed)
Faxed Lyrica to [facility], patient is aware that rx for tramadol is ready for pick up at the front desk.

## 2016-04-25 NOTE — Telephone Encounter (Signed)
Faxed Lyrica to [facility], patient is aware that rx for tramadol is ready for pick up at the front desk.

## 2016-05-01 ENCOUNTER — Encounter (HOSPITAL_COMMUNITY): Payer: Medicare Other

## 2016-05-03 ENCOUNTER — Encounter (INDEPENDENT_AMBULATORY_CARE_PROVIDER_SITE_OTHER): Payer: Self-pay

## 2016-05-03 ENCOUNTER — Other Ambulatory Visit: Payer: Medicare Other

## 2016-05-03 ENCOUNTER — Encounter: Payer: Self-pay | Admitting: Physician Assistant

## 2016-05-03 ENCOUNTER — Ambulatory Visit (INDEPENDENT_AMBULATORY_CARE_PROVIDER_SITE_OTHER): Payer: Medicare Other | Admitting: Physician Assistant

## 2016-05-03 VITALS — BP 102/70 | HR 76 | Ht 67.5 in | Wt 173.6 lb

## 2016-05-03 DIAGNOSIS — R1013 Epigastric pain: Secondary | ICD-10-CM | POA: Diagnosis not present

## 2016-05-03 DIAGNOSIS — K209 Esophagitis, unspecified without bleeding: Secondary | ICD-10-CM

## 2016-05-03 DIAGNOSIS — K219 Gastro-esophageal reflux disease without esophagitis: Secondary | ICD-10-CM

## 2016-05-03 DIAGNOSIS — Z8619 Personal history of other infectious and parasitic diseases: Secondary | ICD-10-CM | POA: Diagnosis not present

## 2016-05-03 MED ORDER — PANTOPRAZOLE SODIUM 40 MG PO TBEC: 40.0000 mg | DELAYED_RELEASE_TABLET | Freq: Every day | ORAL | 11 refills | Status: DC

## 2016-05-03 NOTE — Progress Notes (Signed)
Subjective:    Patient ID: [name] [name], male    DOB: August 06, 1950, 66 y.o.   MRN: [id number]  HPI [name] is a pleasant 66 year old white male known to Dr. Ardis Hughs. He was last seen here in 2014. He had EGD done in 2012 with finding of gastritis, erosive esophagitis and a small to medium-sized hiatal hernia. He was H. pylori positive and treated. Colonoscopy done in January 2012 no polyps, left-sided diverticulosis and small hemorrhoids noted. He has history of hypertension, a nonischemic cardiomyopathy with EF of approximately 35%, congestive heart failure and sleep apnea. He comes in today with complaints of burning subxiphoid discomfort very similar to his previous esophagitis symptoms. He says she's been having trouble over the past 3 months, somewhat progressive and usually worse with alcohol. He denies any dysphagia or odynophagia. He says he is having symptoms most days but not all day long and frequently will have difficulty falling asleep because of burning. He has been taking a whole aspirin daily no NSAIDs. He has not been on any PPI over the past couple of years.  Review of Systems Pertinent positive and negative review of systems were noted in the above HPI section.  All other review of systems was otherwise negative.  Outpatient Encounter Prescriptions as of 05/03/2016  Medication Sig  . aspirin 81 MG tablet Take 1 tablet (81 mg total) by mouth daily. (Patient taking differently: Take 81 mg by mouth at bedtime. )  . carvedilol (COREG) 12.5 MG tablet TAKE ONE TABLET BY MOUTH TWICE DAILY  . carvedilol (COREG) 12.5 MG tablet Take 1 tablet (12.5 mg total) by mouth 2 (two) times daily.  . cyclobenzaprine (FLEXERIL) 10 MG tablet Take 1 tablet (10 mg total) by mouth 2 (two) times daily as needed for muscle spasms.  [facility] eplerenone (INSPRA) 25 MG tablet Take 1 tablet (25 mg total) by mouth daily.  . pregabalin (LYRICA) 75 MG capsule Take 1 capsule (75 mg total) by mouth 2 (two) times daily.  .  sacubitril-valsartan (ENTRESTO) 24-26 MG Take 1 tablet by mouth 2 (two) times daily.  . traMADol (ULTRAM) 50 MG tablet TAKE ONE TABLET BY MOUTH EVERY 4 TO 6 HOURS AS NEEDED FOR PAIN  . pantoprazole (PROTONIX) 40 MG tablet Take 1 tablet (40 mg total) by mouth daily.  . [DISCONTINUED] atorvastatin (LIPITOR) 40 MG tablet Take 1 tablet (40 mg total) by mouth daily. (Patient not taking: Reported on 09/13/2015)  . [DISCONTINUED] fenofibrate 54 MG tablet TAKE ONE TABLET BY MOUTH ONCE DAILY (Patient not taking: Reported on 09/13/2015)  . [DISCONTINUED] oxyCODONE-acetaminophen (PERCOCET/ROXICET) 5-325 MG tablet Take 1-2 tablets by mouth every 4 (four) hours as needed for moderate pain.   No facility-administered encounter medications on file as of 05/03/2016.    No Known Allergies Patient Active Problem List   Diagnosis Date Noted  . Lumbar disc herniation with radiculopathy 09/20/2015    Class: Acute  . Ganglion cyst of wrist 09/20/2015    Class: Chronic  . OSA (obstructive sleep apnea) 07/22/2014  . Hyperlipidemia 01/08/2013  . Elevated LFTs 01/24/2012  . Chronic systolic heart failure ([place]) 11/11/2010  . Asthma 11/11/2010  . ANAL AND RECTAL POLYP 01/14/2010  . HEARTBURN 01/14/2010  . RECTAL BLEEDING 11/24/2009  . BREAST MASS, LEFT 11/24/2009  . COUGH 05/11/2009  . HYPERTRIGLYCERIDEMIA 12/29/2008  . Chest pain 12/29/2008  . Essential hypertension 03/11/2008  . Nonischemic cardiomyopathy ([place]) 03/11/2008  . Other and unspecified hyperlipidemia 12/25/2006  . ALCOHOLISM 12/25/2006  . ALLERGIC RHINITIS  12/25/2006  . HYPERGLYCEMIA 12/25/2006  . UROLITHIASIS, HX OF 12/25/2006   Social History   Social History  . Marital status: Divorced    Spouse name: N/A  . Number of children: 2  . Years of education: N/A   Occupational History  . Disability    Social History Main Topics  . Smoking status: Never Smoker  . Smokeless tobacco: Never Used     Comment: denies   . Alcohol use 0.0 oz/week      Comment: rare  . Drug use: No     Comment: Smoked marijuana on occasion in distant past.   . Sexual activity: Not on file   Other Topics Concern  . Not on file   Social History Narrative  . No narrative on file    Mr. [name] family history includes Cardiomyopathy in his brother; Cirrhosis in his brother; Pancreatitis in his sister; Prostate cancer in his father; Stroke in his mother.      Objective:    Vitals:   05/03/16 1004  BP: 102/70  Pulse: 76    Physical Exam  well-developed older white male in no acute distress, pleasant blood pressure 102/70 pulse 76 height 5 foot 7, weight 173, BMI 26.7. HEENT; nontraumatic normocephalic EOMI PERRLA sclera anicteric, Cardiovascular; regular rate and rhythm with S1-S2 no murmur or gallop, Pulmonary; clear bilaterally, Abdomen ;soft, nontender nondistended bowel sounds are active there is no palpable mass or hepatosplenomegaly, Rectal ;exam not done, Ext; no clubbing cyanosis or edema skin warm and dry, Neuropsych; mood and affect appropriate       Assessment & Plan:   #74 66 year old white male with history of GERD and erosive esophagitis as well as H. pylori induced gastropathy who presents with 3 month history of subxiphoid burning discomfort area Symptoms are consistent with GERD and probable esophagitis. #2 diverticulosis #3 colon cancer screening patient up-to-date with colonoscopy, last colon 2012 no polyps #4 congestive heart failure #5 nonischemic cardiomyopathy with EF 35%   #6 hypertension  Plan; start Protonix 40 mg by mouth every morning Patient was given samples of Nexium 24 hour OTC until he can get his prescription filled when his disability check comes  in 2 weeks. Antireflux regimen reviewed with patient and he was also given educational materials Decrease aspirin to 81 mg by mouth daily Check H. pylori stool antigen and if positive -retreat    Amy S Esterwood PA-C 05/03/2016   Cc: No ref. provider  found

## 2016-05-03 NOTE — Patient Instructions (Addendum)
Please go to the basement level to our lab and stool studies.  We sent a prescription to [place] for Protonix 40 mg.

## 2016-05-04 NOTE — Progress Notes (Signed)
I agree with the above note, plan 

## 2016-06-28 ENCOUNTER — Telehealth (INDEPENDENT_AMBULATORY_CARE_PROVIDER_SITE_OTHER): Payer: Self-pay | Admitting: Specialist

## 2016-06-28 ENCOUNTER — Other Ambulatory Visit (INDEPENDENT_AMBULATORY_CARE_PROVIDER_SITE_OTHER): Payer: Self-pay | Admitting: Specialist

## 2016-06-28 MED ORDER — TRAMADOL HCL 50 MG PO TABS: ORAL_TABLET | ORAL | 0 refills | Status: DC

## 2016-06-28 NOTE — Telephone Encounter (Signed)
Rx for tramadol and probably will need a referral to pain management as he continues to require tramadol a lower level narcotic and lyrica.

## 2016-06-28 NOTE — Telephone Encounter (Signed)
Patient called requesting a refill on his tramadol

## 2016-06-28 NOTE — Telephone Encounter (Signed)
Patient called requesting a refill on his tramadol.  CB#[phone].  Thank you.

## 2016-06-29 NOTE — Telephone Encounter (Signed)
[facility]ed [facility] [address]

## 2016-06-29 NOTE — Progress Notes (Signed)
Called [facility] [address]

## 2016-07-12 ENCOUNTER — Telehealth (HOSPITAL_COMMUNITY): Payer: Self-pay | Admitting: *Deleted

## 2016-07-12 ENCOUNTER — Other Ambulatory Visit (HOSPITAL_COMMUNITY): Payer: Self-pay | Admitting: *Deleted

## 2016-07-12 NOTE — Telephone Encounter (Signed)
Pt called needed to order his Inspra from [facility].  [employer] [employer] [phone]) and ordered med, confirmation [id number]

## 2016-08-01 ENCOUNTER — Telehealth (HOSPITAL_COMMUNITY): Payer: Self-pay | Admitting: *Deleted

## 2016-08-01 ENCOUNTER — Other Ambulatory Visit (HOSPITAL_COMMUNITY): Payer: Self-pay | Admitting: *Deleted

## 2016-08-01 MED ORDER — EPLERENONE 25 MG PO TABS: 25.0000 mg | ORAL_TABLET | Freq: Every day | ORAL | 0 refills | Status: DC

## 2016-08-01 NOTE — Telephone Encounter (Signed)
Patient called asking about his Inspra that we had ordered from [facility].  Patient is aware that he needs to go pick up supply from [facility] until his comes in.  Patient understands with no further questions.

## 2016-08-01 NOTE — Telephone Encounter (Signed)
Patient's medication actually was delivered to our office today. Patient is aware that medication is here and he will pick it up tomorrow.  I have called [name] pharmacy and made them aware.

## 2016-08-23 ENCOUNTER — Other Ambulatory Visit (INDEPENDENT_AMBULATORY_CARE_PROVIDER_SITE_OTHER): Payer: Self-pay | Admitting: Specialist

## 2016-08-23 ENCOUNTER — Telehealth (INDEPENDENT_AMBULATORY_CARE_PROVIDER_SITE_OTHER): Payer: Self-pay | Admitting: Specialist

## 2016-08-23 MED ORDER — CYCLOBENZAPRINE HCL 10 MG PO TABS: 10.0000 mg | ORAL_TABLET | Freq: Two times a day (BID) | ORAL | 0 refills | Status: DC | PRN

## 2016-08-23 MED ORDER — TRAMADOL HCL 50 MG PO TABS: ORAL_TABLET | ORAL | 0 refills | Status: DC

## 2016-08-23 NOTE — Telephone Encounter (Signed)
Patient called needing a refill on tramadol and flexeril sent to his pharmacy at the [facility] on [address]. CB # [phone]

## 2016-08-23 NOTE — Telephone Encounter (Signed)
This has been done already

## 2016-08-23 NOTE — Telephone Encounter (Signed)
Called pt ato to advise that rxs were sent to [facility] but [facility] [address]---I called [facility] and cancelled rx and [facility] [address] ---pt is aware.

## 2016-08-23 NOTE — Telephone Encounter (Signed)
PT REQUESTS REFILL OF FLEXERIL AND TRAMADOL PLEASE.  [phone]

## 2016-09-12 ENCOUNTER — Telehealth (INDEPENDENT_AMBULATORY_CARE_PROVIDER_SITE_OTHER): Payer: Self-pay | Admitting: Specialist

## 2016-09-12 ENCOUNTER — Telehealth (INDEPENDENT_AMBULATORY_CARE_PROVIDER_SITE_OTHER): Payer: Self-pay

## 2016-09-12 ENCOUNTER — Other Ambulatory Visit (INDEPENDENT_AMBULATORY_CARE_PROVIDER_SITE_OTHER): Payer: Self-pay | Admitting: Specialist

## 2016-09-12 MED ORDER — PREGABALIN 75 MG PO CAPS: 75.0000 mg | ORAL_CAPSULE | Freq: Two times a day (BID) | ORAL | 3 refills | Status: DC

## 2016-09-12 NOTE — Telephone Encounter (Signed)
Rx for Lyrica printed, will need to be faxed to [name] [name] ' pharmacy.

## 2016-09-12 NOTE — Telephone Encounter (Signed)
Patient called stating that he needs a new Rx for Lyrica sent to [facility].  ID # is [id number].  CB# is [phone].  Please Advise. Thank You

## 2016-09-12 NOTE — Telephone Encounter (Signed)
Duplicate message. 

## 2016-09-12 NOTE — Telephone Encounter (Signed)
Patient called asked if Dr Louanne Skye could fax a Rx for him to receive (Lyrica) Patient said the Rx will need a cover sheet. Patient said he get a 90 day supply. Patient said the paperwork is on file. The account # is [id number]   The number to contact patient is [phone]

## 2016-09-13 NOTE — Progress Notes (Signed)
I called and advised pt that a new rx was faxed to [facility] @ [phone], their phone is [phone]

## 2016-09-13 NOTE — Telephone Encounter (Signed)
I called and advised pt that a new rx was faxed to [facility] @ [phone], their phone is [phone]

## 2016-09-25 ENCOUNTER — Telehealth (INDEPENDENT_AMBULATORY_CARE_PROVIDER_SITE_OTHER): Payer: Self-pay | Admitting: Specialist

## 2016-09-25 MED ORDER — PREGABALIN 75 MG PO CAPS: 75.0000 mg | ORAL_CAPSULE | Freq: Two times a day (BID) | ORAL | 3 refills | Status: DC

## 2016-09-25 NOTE — Addendum Note (Signed)
Addended by: Minda Ditto, Alyse Low N on: 09/25/2016 03:12 PM   Modules accepted: Orders

## 2016-09-25 NOTE — Telephone Encounter (Signed)
error 

## 2016-09-25 NOTE — Telephone Encounter (Signed)
I resubmitted info to [employer] [employer]

## 2016-09-25 NOTE — Telephone Encounter (Signed)
Patient called and said his Lyrica RX that was sent to [facility] was not received. He was asking if it could be re sent. Fax # [phone]  CB # [phone]

## 2016-09-28 ENCOUNTER — Other Ambulatory Visit (HOSPITAL_COMMUNITY): Payer: Self-pay | Admitting: Cardiology

## 2016-10-09 ENCOUNTER — Telehealth (HOSPITAL_COMMUNITY): Payer: Self-pay | Admitting: *Deleted

## 2016-10-09 NOTE — Telephone Encounter (Signed)
Inspra (eplerenone) left at front desk for pick up.  Left vm for patient to pick up medication from the front desk.

## 2016-10-16 NOTE — Telephone Encounter (Signed)
Patient called back today and he is aware of his Inspra being ready for pickup at the front desk.

## 2016-10-17 ENCOUNTER — Other Ambulatory Visit (INDEPENDENT_AMBULATORY_CARE_PROVIDER_SITE_OTHER): Payer: Self-pay | Admitting: Specialist

## 2016-10-17 ENCOUNTER — Other Ambulatory Visit (INDEPENDENT_AMBULATORY_CARE_PROVIDER_SITE_OTHER): Payer: Self-pay | Admitting: Radiology

## 2016-10-17 MED ORDER — TRAMADOL HCL 50 MG PO TABS: ORAL_TABLET | ORAL | 0 refills | Status: DC

## 2016-10-17 NOTE — Telephone Encounter (Signed)
PT REQUESTS REFILL OF  TRAMADOL PLEASE.  [facility] [address].  [phone]

## 2016-10-17 NOTE — Telephone Encounter (Signed)
I called rx to pharm and I called and advised patient that this has been done.

## 2016-11-09 ENCOUNTER — Telehealth (HOSPITAL_COMMUNITY): Payer: Self-pay | Admitting: Pharmacist

## 2016-11-09 NOTE — Telephone Encounter (Signed)
[employer] patient assistance approved for Inspra 25 mg through 09/06/17. Sending 90 day supply to clinic today (order #: [id number]).   Ruta Hinds. Velva Harman, PharmD, BCPS, CPP Clinical Pharmacist Pager: [phone] Phone: [phone] 11/09/2016 11:23 AM

## 2016-11-20 ENCOUNTER — Telehealth (HOSPITAL_COMMUNITY): Payer: Self-pay | Admitting: *Deleted

## 2016-11-20 NOTE — Telephone Encounter (Signed)
Received pt's Inspra from [facility], pt is aware and will p/u later today or tomorrow

## 2016-11-29 ENCOUNTER — Encounter (HOSPITAL_COMMUNITY): Payer: Medicare Other | Admitting: Cardiology

## 2016-12-07 ENCOUNTER — Other Ambulatory Visit (INDEPENDENT_AMBULATORY_CARE_PROVIDER_SITE_OTHER): Payer: Self-pay

## 2016-12-07 MED ORDER — TRAMADOL HCL 50 MG PO TABS: ORAL_TABLET | ORAL | 0 refills | Status: DC

## 2016-12-07 MED ORDER — CYCLOBENZAPRINE HCL 10 MG PO TABS
10.0000 mg | ORAL_TABLET | Freq: Two times a day (BID) | ORAL | 0 refills | Status: DC | PRN
Start: 2016-12-07 — End: 2018-03-13

## 2016-12-07 NOTE — Telephone Encounter (Signed)
Patient would a Rx refill on Flexeril and Tramadol.  Cb# is [phone].  Please advise.  Thank You.

## 2016-12-07 NOTE — Addendum Note (Signed)
Addended by: Minda Ditto, Geoffery Spruce on: 12/07/2016 09:44 AM   Modules accepted: Orders

## 2016-12-07 NOTE — Telephone Encounter (Signed)
I called and lmom for pt that his rx's hav sent to his pharm

## 2016-12-25 ENCOUNTER — Ambulatory Visit (HOSPITAL_COMMUNITY)
Admission: RE | Admit: 2016-12-25 | Discharge: 2016-12-25 | Disposition: A | Discharge status: Home / Self Care | Payer: Medicare HMO | Source: Ambulatory Visit | Attending: Cardiology | Admitting: Cardiology

## 2016-12-25 ENCOUNTER — Encounter (HOSPITAL_COMMUNITY): Payer: Self-pay | Admitting: Cardiology

## 2016-12-25 VITALS — BP 116/63 | HR 61 | Wt 175.2 lb

## 2016-12-25 DIAGNOSIS — I428 Other cardiomyopathies: Secondary | ICD-10-CM

## 2016-12-25 DIAGNOSIS — I429 Cardiomyopathy, unspecified: Secondary | ICD-10-CM | POA: Diagnosis not present

## 2016-12-25 DIAGNOSIS — N189 Chronic kidney disease, unspecified: Secondary | ICD-10-CM | POA: Insufficient documentation

## 2016-12-25 DIAGNOSIS — G4733 Obstructive sleep apnea (adult) (pediatric): Secondary | ICD-10-CM | POA: Diagnosis not present

## 2016-12-25 DIAGNOSIS — E781 Pure hyperglyceridemia: Secondary | ICD-10-CM | POA: Insufficient documentation

## 2016-12-25 DIAGNOSIS — Z79899 Other long term (current) drug therapy: Secondary | ICD-10-CM | POA: Insufficient documentation

## 2016-12-25 DIAGNOSIS — E785 Hyperlipidemia, unspecified: Secondary | ICD-10-CM | POA: Diagnosis not present

## 2016-12-25 DIAGNOSIS — Z7982 Long term (current) use of aspirin: Secondary | ICD-10-CM | POA: Insufficient documentation

## 2016-12-25 DIAGNOSIS — K219 Gastro-esophageal reflux disease without esophagitis: Secondary | ICD-10-CM | POA: Insufficient documentation

## 2016-12-25 DIAGNOSIS — Z8042 Family history of malignant neoplasm of prostate: Secondary | ICD-10-CM | POA: Insufficient documentation

## 2016-12-25 DIAGNOSIS — I130 Hypertensive heart and chronic kidney disease with heart failure and stage 1 through stage 4 chronic kidney disease, or unspecified chronic kidney disease: Secondary | ICD-10-CM | POA: Diagnosis not present

## 2016-12-25 DIAGNOSIS — I5022 Chronic systolic (congestive) heart failure: Secondary | ICD-10-CM | POA: Insufficient documentation

## 2016-12-25 DIAGNOSIS — Z79891 Long term (current) use of opiate analgesic: Secondary | ICD-10-CM | POA: Diagnosis not present

## 2016-12-25 LAB — BASIC METABOLIC PANEL
ANION GAP: 8 (ref 5–15)
BUN: 11 mg/dL (ref 6–20)
CALCIUM: 8.8 mg/dL — AB (ref 8.9–10.3)
CO2: 22 mmol/L (ref 22–32)
CREATININE: 1.02 mg/dL (ref 0.61–1.24)
Chloride: 106 mmol/L (ref 101–111)
GLUCOSE: 101 mg/dL — AB (ref 65–99)
Potassium: 4.5 mmol/L (ref 3.5–5.1)
Sodium: 136 mmol/L (ref 135–145)

## 2016-12-25 LAB — LIPID PANEL
CHOL/HDL RATIO: 5.4 ratio
CHOLESTEROL: 215 mg/dL — AB (ref 0–200)
HDL: 40 mg/dL — ABNORMAL LOW (ref >40)
LDL Cholesterol: 106 mg/dL — ABNORMAL HIGH (ref 0–99)
Triglycerides: 345 mg/dL — ABNORMAL HIGH (ref <150)
VLDL: 69 mg/dL — ABNORMAL HIGH (ref 0–40)

## 2016-12-25 LAB — CBC
HCT: 43.2 % (ref 39.0–52.0)
Hemoglobin: 14.9 g/dL (ref 13.0–17.0)
MCH: 32.9 pg (ref 26.0–34.0)
MCHC: 34.5 g/dL (ref 30.0–36.0)
MCV: 95.4 fL (ref 78.0–100.0)
PLATELETS: 129 10*3/uL — AB (ref 150–400)
RBC: 4.53 MIL/uL (ref 4.22–5.81)
RDW: 13.1 % (ref 11.5–15.5)
WBC: 4.9 10*3/uL (ref 4.0–10.5)

## 2016-12-25 LAB — TSH: TSH: 3.153 u[IU]/mL (ref 0.350–4.500)

## 2016-12-25 NOTE — Patient Instructions (Signed)
EKG today ok.  Will schedule you for an echocardiogram at [facility]. Address: [address] ([place]), [place], [place] [address]  Phone: [phone] You will be scheduled by phone.  Routine lab work today. Will notify you of abnormal results, otherwise no news is good news!  Return for labs in 3 months.  ___________________________________________________________  ___________________________________________________________  Follow up 6 months with Dr. Aundra Dubin. We will call you closer to this time, or you may call our office to schedule 1 month before you are due to be seen. Take all medication as prescribed the day of your appointment. Bring all medications with you to your appointment.  Do the following things EVERYDAY: 1) Weigh yourself in the morning before breakfast. Write it down and keep it in a log. 2) Take your medicines as prescribed 3) Eat low salt foods-Limit salt (sodium) to 2000 mg per day.  4) Stay as active as you can everyday 5) Limit all fluids for the day to less than 2 liters

## 2016-12-26 ENCOUNTER — Telehealth (HOSPITAL_COMMUNITY): Payer: Self-pay

## 2016-12-26 NOTE — Telephone Encounter (Signed)
Notes recorded by Shirley Muscat, RN on 12/26/2016 at 10:14 AM EDT Pt aware of results and agreeable to reduce triglyceride intake ------  Notes recorded by Larey Dresser, MD on 12/26/2016 at 9:06 AM EDT Labs fairly stable except for mildly low platelets. Needs to watch diet better with high triglycerides.

## 2016-12-26 NOTE — Progress Notes (Signed)
Patient ID: [name] [name], male   DOB: April 05, 1951, 66 y.o.   MRN: [id number] Pulmonary: Dr Elsworth Soho PCP: none  Cardiology: Dr. Aundra Dubin  66 yo with history of nonischemic cardiomyopathy presents for followup of CHF.  I have not seen him for over a year.  He has, however, been doing well symptomatically.  He goes to the gym 4 days/week and dances Friday night.  No significant exertional dyspnea. No chest pain.  No lightheadedness. Taking all meds.  No orthopnea/PND.   ECG (personally reviewed): NSR, RBBB  Labs (12/09): SPEP negative, HIV negative, transferrin saturation 29%, BNP 24 Labs (9/10): direct LDL 54, HDL 13.8, triglycerides 1470, creatinine 0.8 Labs (05/11/09): BNP 27, WBCs 5.6 Labs (5/11): K 4.6, creatinine 1.2, TGs 238, LDL 108, HDL 34 Labs (8/11): K 4.3, creatinine 1.4, BNP 12.6 Labs (2/12): K 4.1, creatinine 1.7, LDL 102 Labs (8/12): K 4.7, creatinine 1.5 Labs (4/13): LDL 132, HDL 33.4, AST 41, ALT 63 Labs (7/13): K 4, creatinine 1.2, ALT 59, AST 36, HCV negative Labs (1/14): K 4.1, creatinine 1.5 Labs (04/24/14): Cholesterol 222, TGL 265, HDL 37, LDL 169 Labs (4/16): LDL 110, HDl 38 Labs (9/16): LDL 116, HDL 36, TGs 175 Labs (01/2015): K 3.8 Creatinine 1.2 => 1.08, HCT 37 Labs (3/17): creatinine 1.07, BNP 8.9 Labs (6/17): K 4.2, creatinine 1.0  Allergies (verified):  No Known Drug Allergies  Past Medical History: 1. Nonischemic cardiomyopathy.  The patient had a left heart catheterization done in March 2004 with normal coronaries and then in November 2008, he had a Myoview done that showed an EF of 39% with no ischemia.  Echo in April 2008 showed an EF of 35-40%, moderate diffuse hypokinesis, mild left atrial enlargement, normal RV size and function and essentially normal valves.  The cause of his cardiomyopathy has not been discovered. He drinks but not extremely heavily.  He has never used cocaine and amphetamines.  HIV, SPEP, and ferritin/Fe studies were all negative in 12/09.   Echo (9/10): EF 40%, mild to moderate global HK, mild LAE.  Myoview (5/11): EF 42% with apical thinning but no evidence for ischemia or infarction.  Echo (3/12): EF 45-50%, no significant valvular abnormalities. Echo (1/16) with EF 35-40%, diffuse hypokinesis.  - Cardiac MRI (3/17) with EF 43%, mild diffuse hypokinesis, mid-wall LGE in the mid septum, possible prior viral myocarditis.  2. Hypertriglyceridemia 3. Rotator cuff tendonitis with a history of bilateral shoulder surgery. 4. Hypertension. 5. History of a back surgery. 6. Knee osteoarthritis, s/p arthroscopy 2010 7. Gastroesophageal reflux disease. EGD (1/12) with erosive esophagitis and gastritis, biopsy positive for H pylori (being treated). 8. Colonoscopy (1/12) with mild diverticulosis and hemorrhoids.  9. Allergic rhinitis 10. Gynecomastia with spironolactone (ok on eplerenone).  11. Probable mild intermittent asthma 12. Elevated LFTs: HCV negative, possibly due to ETOH 13. CKD 14. OSA: CPAP.  15. H/o herniated nucleus pulposus: s/p surgery 6/17.  Family History: No premature CAD.  Father with prostate cancer Mother with CVA Brother with nonischemic cardiomyopathy, has ICD Grandmother with cancer (unsure what)    Social History: The patient denies smoking.  Lives in [place].  He does not use any drugs. He has never used any illicit drugs other than marijuana, which he smoked occasionally in the distant past.  Moderate ETOH.He is on disability. He is divorced with two sons.     ROS:  All systems reviewed and negative except as per HPI.    Current Outpatient Prescriptions  Medication Sig Dispense Refill  .  aspirin 81 MG tablet Take 1 tablet (81 mg total) by mouth daily.    . carvedilol (COREG) 12.5 MG tablet TAKE ONE TABLET BY MOUTH TWICE DAILY 180 tablet 3  . cyclobenzaprine (FLEXERIL) 10 MG tablet Take 1 tablet (10 mg total) by mouth 2 (two) times daily as needed for muscle spasms. 60 tablet 0  . eplerenone  (INSPRA) 25 MG tablet Take 1 tablet (25 mg total) by mouth daily. 14 tablet 0  . pantoprazole (PROTONIX) 40 MG tablet Take 1 tablet (40 mg total) by mouth daily. 30 tablet 11  . pregabalin (LYRICA) 75 MG capsule Take 1 capsule (75 mg total) by mouth 2 (two) times daily. 180 capsule 3  . sacubitril-valsartan (ENTRESTO) 24-26 MG Take 1 tablet by mouth 2 (two) times daily. 60 tablet 3  . traMADol (ULTRAM) 50 MG tablet TAKE ONE TABLET BY MOUTH EVERY 4 TO 6 HOURS AS NEEDED FOR PAIN 90 tablet 0   No current facility-administered medications for this encounter.     BP 116/63   Pulse 61   Wt 175 lb 4 oz (79.5 kg)   SpO2 100%   BMI 27.04 kg/m  General: NAD Neck: No JVD, no thyromegaly or thyroid nodule.  Lungs: Clear to auscultation bilaterally with normal respiratory effort. CV: Nondisplaced PMI.  Heart regular S1/S2, no S3/S4, no murmur.  No peripheral edema.  No carotid bruit.  Normal pedal pulses.  Abdomen: Soft, nontender, no hepatosplenomegaly, no distention.  Skin: Intact without lesions or rashes.  Neurologic: Alert and oriented x 3.  Psych: Normal affect. Extremities: No clubbing or cyanosis.  HEENT: Normal.   Assessment/Plan:  Chronic systolic heart failure  Nonischemic cardiomyopathy, most recent ECHO 04/2014 with EF down to 35-40%.  Cardiac MRI in 3/17, however, showed EF 43% with mid-wall pattern of LGE in the septum that may be suggestive of prior myocarditis.  No heavy substance abuse (though occasionally binge drinks), HIV negative, SPEP negative, no evidence for hemochromatosis. Would also consider familial cardiomyopathy as his brother apparently also has a nonischemic cardiomyopathy and got an ICD.  NYHA I-II. Volume status stable. Not currently on lasix.  - He needs repeat echo to reassess EF.  I will arrange.  - Continue Inspra 25 mg daily.  - Continue carvedilol 12.5 mg twice a day.  - Continue Entresto 24/26 bid.  I will increase this if EF is worse by echo.  - I  encouraged him to limit ETOH.  - BMET today and will need BMET every 3 months on his current medication regimen.  Hyperlipidemia Continue atorvastatin, check lipids today.   OSA Unable to tolerate CPAP.   Followup in 4 months.   Loralie Champagne 12/26/2016

## 2017-01-03 ENCOUNTER — Telehealth (HOSPITAL_COMMUNITY): Payer: Self-pay | Admitting: Pharmacist

## 2017-01-03 MED ORDER — SACUBITRIL-VALSARTAN 24-26 MG PO TABS: 1.0000 | ORAL_TABLET | Freq: Two times a day (BID) | ORAL | 11 refills | Status: DC

## 2017-01-03 NOTE — Telephone Encounter (Signed)
PANF has run out of funding so not able to re-enroll Mr. Recore to assist with his [facility]ene Loll copays. Will send a [employer] PAF application for him to fill out and send back to me. Offered to provide samples in the meantime.   Ruta Hinds. Velva Harman, PharmD, BCPS, CPP Clinical Pharmacist Pager: [phone] Phone: [phone] 01/03/2017 2:25 PM

## 2017-01-09 ENCOUNTER — Other Ambulatory Visit (INDEPENDENT_AMBULATORY_CARE_PROVIDER_SITE_OTHER): Payer: Self-pay | Admitting: Specialist

## 2017-01-09 MED ORDER — PREGABALIN 75 MG PO CAPS: 75.0000 mg | ORAL_CAPSULE | Freq: Two times a day (BID) | ORAL | 2 refills | Status: DC

## 2017-01-09 NOTE — Telephone Encounter (Signed)
Patient called needing Rx for (Lyrica) refilled. The Rx need to be faxed with a cover sheet attached. Patient there is a new policy for the company. Patient said a Rx should be sent in every 6 months for two refills.  The fax# is [phone]. Account # is [id number].  The number to contact patient is [phone]

## 2017-01-10 MED ORDER — PREGABALIN 75 MG PO CAPS: 75.0000 mg | ORAL_CAPSULE | Freq: Two times a day (BID) | ORAL | 2 refills | Status: DC

## 2017-01-10 NOTE — Telephone Encounter (Signed)
I printed and faxed the rx to [facility], I included the patients acct #, Name, Address, Phone number on the cover sheet

## 2017-01-10 NOTE — Addendum Note (Signed)
Addended by: Minda Ditto, Geoffery Spruce on: 01/10/2017 08:41 AM   Modules accepted: Orders

## 2017-01-25 ENCOUNTER — Ambulatory Visit (HOSPITAL_COMMUNITY): Payer: Medicare HMO | Attending: Cardiovascular Disease

## 2017-01-25 ENCOUNTER — Other Ambulatory Visit: Payer: Self-pay

## 2017-01-25 DIAGNOSIS — I428 Other cardiomyopathies: Secondary | ICD-10-CM

## 2017-02-07 ENCOUNTER — Telehealth (HOSPITAL_COMMUNITY): Payer: Self-pay | Admitting: Pharmacist

## 2017-02-07 NOTE — Telephone Encounter (Signed)
[employer] patient assistance denied for [employer]. If patient runs out of medication, can provide samples until PANF reopens by 04/10/17.   Ruta Hinds. Velva Harman, PharmD, BCPS, CPP Clinical Pharmacist Pager: [phone] Phone: [phone] 02/07/2017 9:14 AM

## 2017-02-10 IMAGING — CT CT RENAL STONE PROTOCOL
2 of 4 series · 15 of 46 positions shown, 17 images · non-contrast
Comparison: July 04, 2003

CLINICAL DATA: Right flank pain

EXAM:
CT ABDOMEN AND PELVIS WITHOUT CONTRAST
TECHNIQUE: Multidetector CT imaging of the abdomen and pelvis was performed
following the standard protocol without oral or intravenous contrast
material administration.

[Series 2: renal stone > 200 lbs 5.0 b31f · axial · 0.76mm/px · z∈[+726,+1166]mm · 12 of 101 slices shown, 14 images]
[im 9/101  soft-tissue]
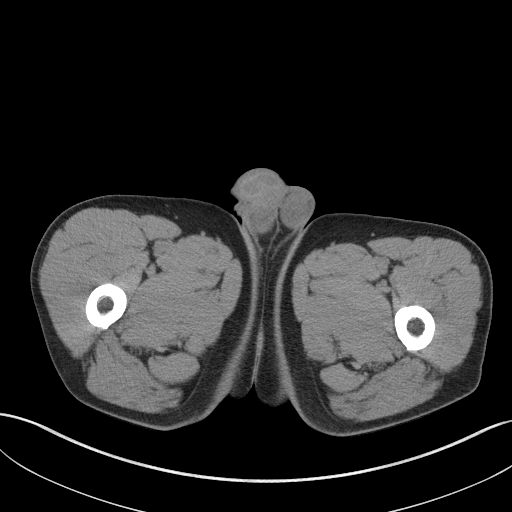
[im 9/101  bone]
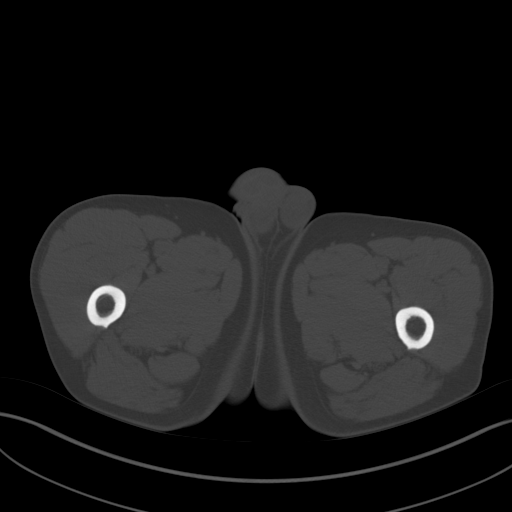
[im 17/101  soft-tissue]
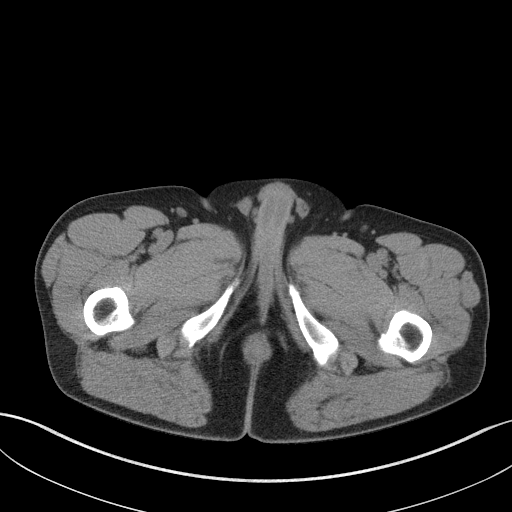
[im 25/101  soft-tissue]
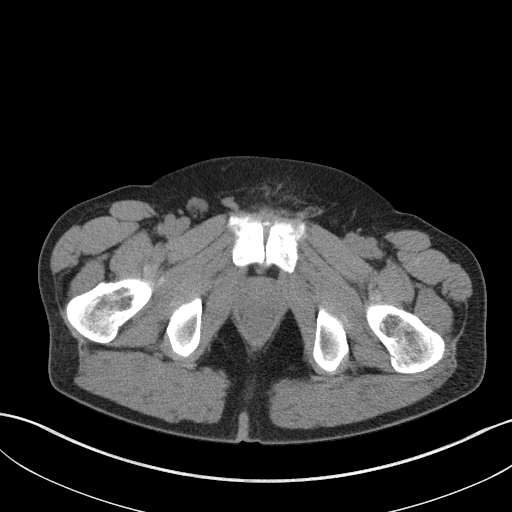
[im 33/101  soft-tissue]
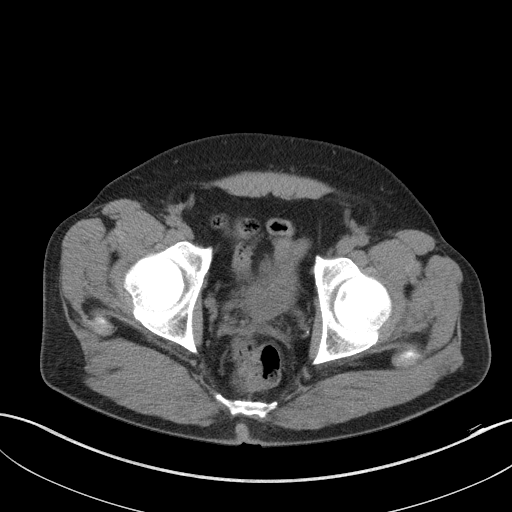
[im 41/101  soft-tissue]
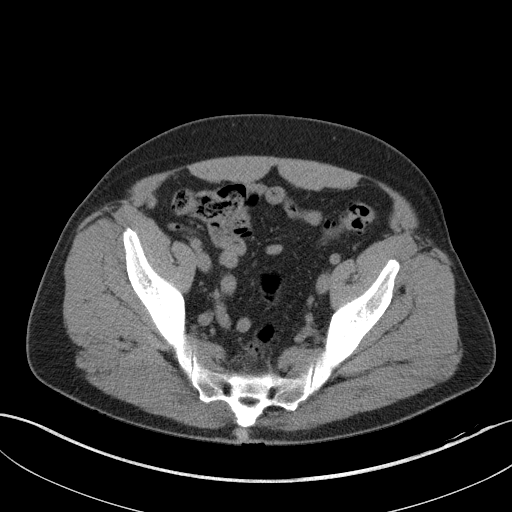
[im 49/101  soft-tissue]
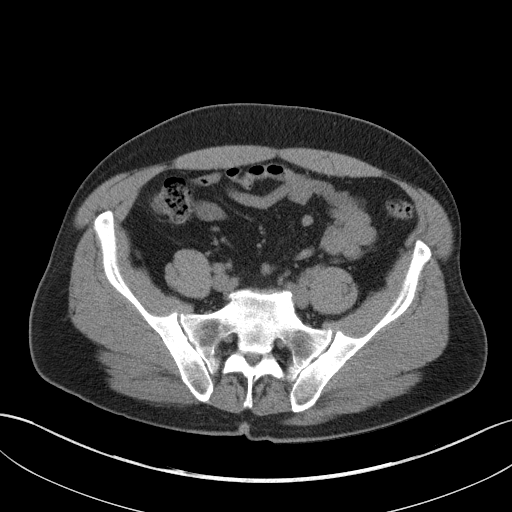
[im 57/101  soft-tissue]
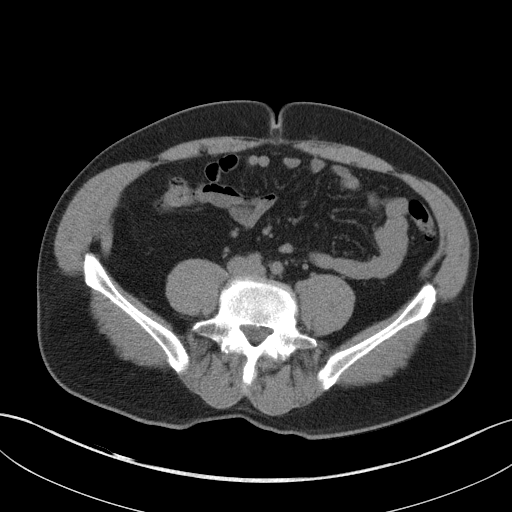
[im 65/101  soft-tissue]
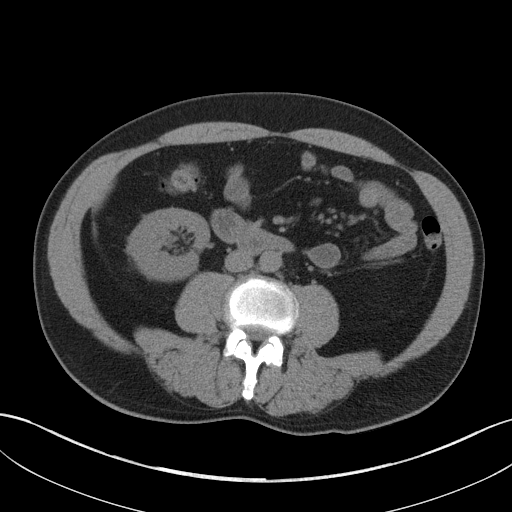
[im 73/101  soft-tissue]
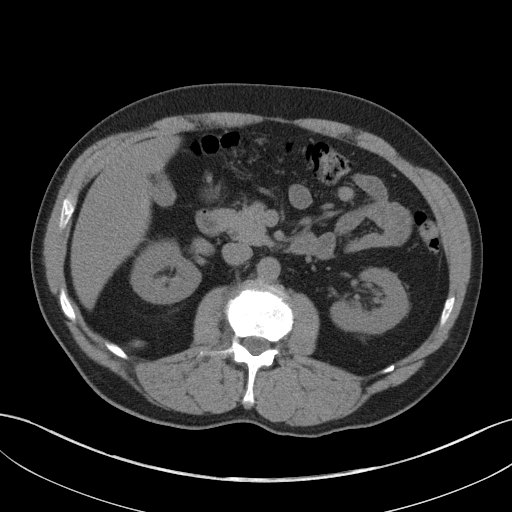
[im 73/101  bone]
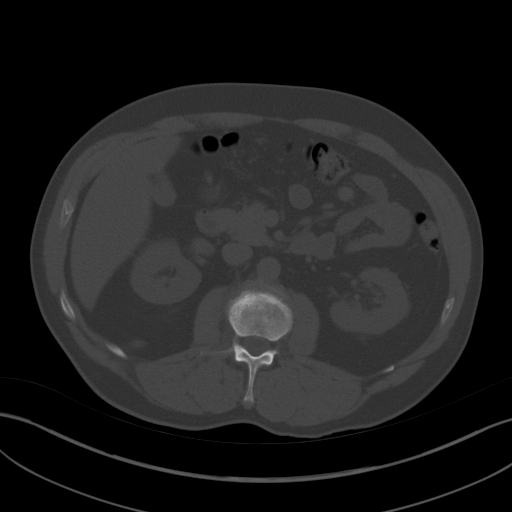
[im 81/101  soft-tissue]
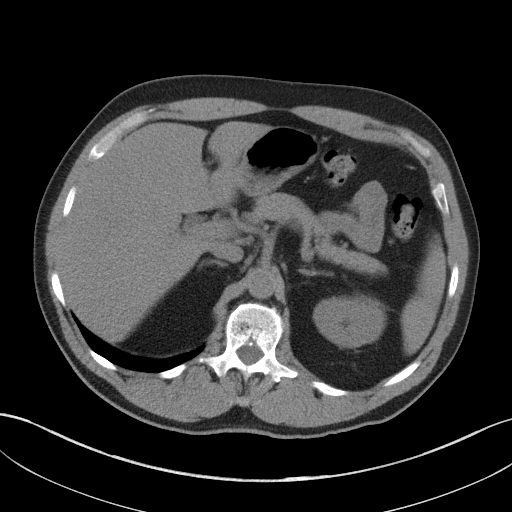
[im 89/101  soft-tissue]
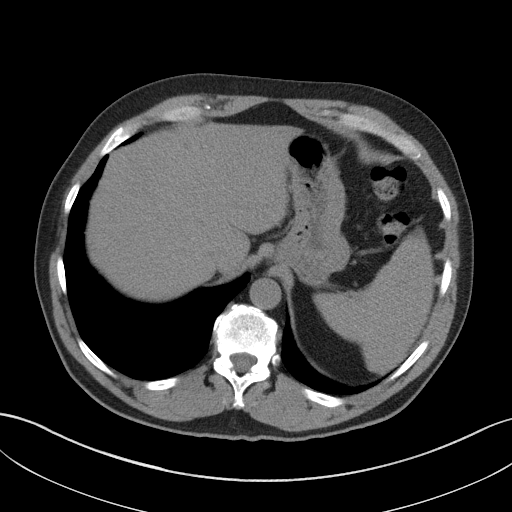
[im 97/101  soft-tissue]
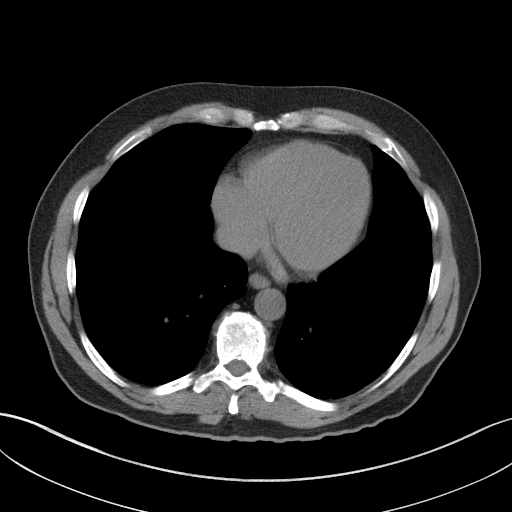

[Series 5: renal stone 3.0 coronal · coronal · 0.79mm/px · 3 of 94 slices shown]
[im 32/94  soft-tissue]
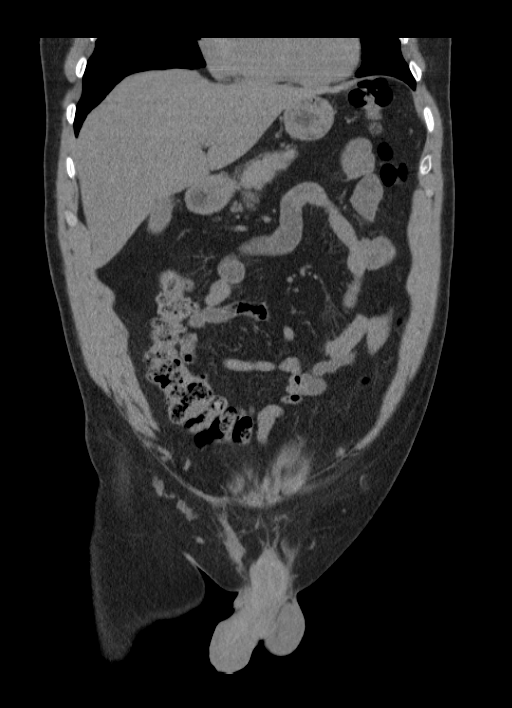
[im 42/94  soft-tissue]
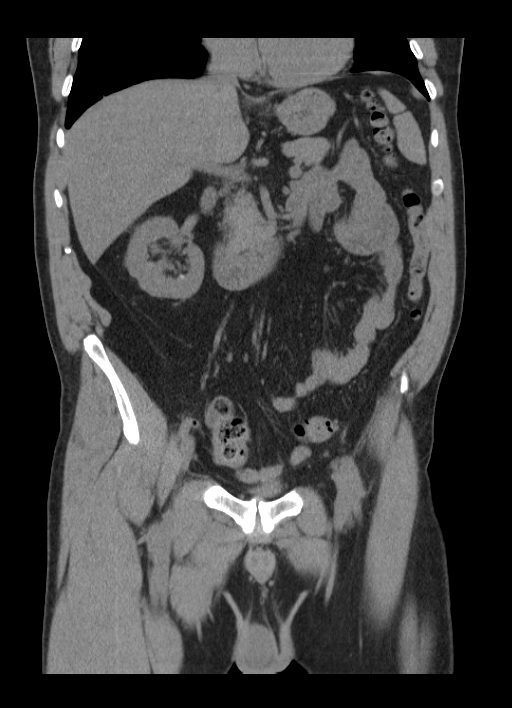
[im 52/94  soft-tissue]
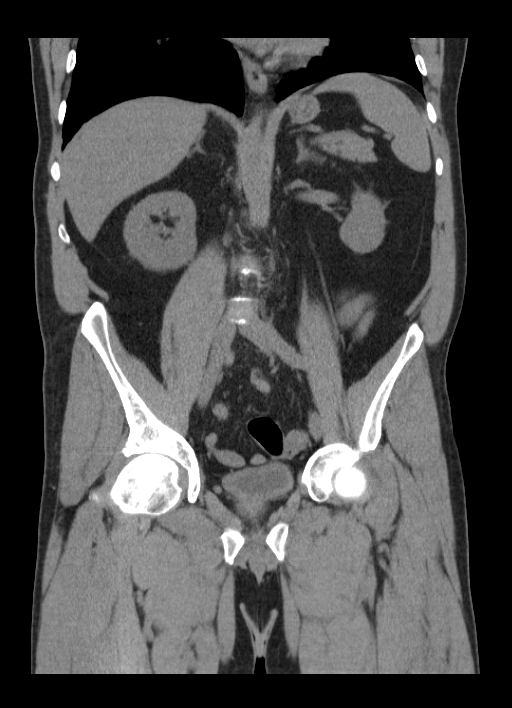

[15 of 46 positions shown; findings below may reference images not displayed]

FINDINGS: Lower chest:  Lung bases are clear.

Hepatobiliary: No focal liver lesions are appreciable on this
noncontrast enhanced study. Gallbladder wall is not appreciably
thickened. There is no biliary duct dilatation.

Pancreas: No pancreatic mass or inflammatory focus.

Spleen: No splenic lesion appreciable.

Adrenals/Urinary Tract: Adrenals appear normal bilaterally. Left
kidney shows no evidence of mass or hydronephrosis. There is no left
renal or ureteral calculus. On the right, there is a 1 mm calculus
in the lower pole region. Slightly more anteriorly, there is a 2 mm
calculus in the lower pole of the right kidney. There is no right
renal mass or hydronephrosis. There is no right ureteral calculus.
Urinary bladder is partially decompressed with wall upper normal in
thickness.

Stomach/Bowel: There are multiple sigmoid diverticula without
diverticulitis. Scattered diverticula noted elsewhere in the colon.
There is no bowel obstruction. No free air or portal venous air.

Vascular/Lymphatic: There is no demonstrable abdominal aortic
aneurysm. No vascular lesion is identified on this noncontrast
enhanced study. There is no adenopathy in the abdomen or pelvis.
Scattered subcentimeter inguinal lymph nodes are felt to be
nonspecific.

Reproductive: Prostate appears normal in size and contour. There is
no pelvic mass or pelvic fluid collection.

Other: Appendix appears normal. No abscess or ascites in the abdomen
or pelvis. There is a minimal ventral hernia containing only fat.

Musculoskeletal: There is degenerative change in the lumbar spine
with vacuum phenomenon at L5-S1. There is no blastic or lytic bone
lesions. There is mild narrowing of both hip joints. No
intramuscular lesions or abdominal wall lesions.
IMPRESSION: Small nonobstructing calculi in the lower pole the right kidney. No
hydronephrosis on either side. No ureteral calculus on either side.

Urinary bladder largely decompressed. The urinary bladder wall
thickness is probably within normal limits. Correlation with
urinalysis may be advisable to exclude early cystitis, however.

Appendix appears normal. No bowel obstruction. No abscess. Multiple
sigmoid diverticula without diverticulitis.

Minimal ventral hernia containing only fat.

## 2017-03-14 ENCOUNTER — Encounter: Payer: Self-pay | Admitting: Nurse Practitioner

## 2017-03-14 ENCOUNTER — Ambulatory Visit (INDEPENDENT_AMBULATORY_CARE_PROVIDER_SITE_OTHER): Payer: Medicare HMO | Admitting: Nurse Practitioner

## 2017-03-14 VITALS — BP 122/78 | HR 76 | Temp 97.8°F | Ht 67.5 in | Wt 169.0 lb

## 2017-03-14 DIAGNOSIS — H538 Other visual disturbances: Secondary | ICD-10-CM | POA: Diagnosis not present

## 2017-03-14 DIAGNOSIS — J069 Acute upper respiratory infection, unspecified: Secondary | ICD-10-CM | POA: Diagnosis not present

## 2017-03-14 MED ORDER — GUAIFENESIN ER 600 MG PO TB12
600.0000 mg | ORAL_TABLET | Freq: Two times a day (BID) | ORAL | 0 refills | Status: DC | PRN
Start: 2017-03-14 — End: 2018-03-13

## 2017-03-14 MED ORDER — BENZONATATE 100 MG PO CAPS: 100.0000 mg | ORAL_CAPSULE | Freq: Three times a day (TID) | ORAL | 0 refills | Status: DC | PRN

## 2017-03-14 MED ORDER — ALBUTEROL SULFATE HFA 108 (90 BASE) MCG/ACT IN AERS: 1.0000 | INHALATION_SPRAY | Freq: Four times a day (QID) | RESPIRATORY_TRACT | 0 refills | Status: DC | PRN

## 2017-03-14 MED ORDER — FLUTICASONE PROPIONATE 50 MCG/ACT NA SUSP: 2.0000 | Freq: Every day | NASAL | 0 refills | Status: DC

## 2017-03-14 MED ORDER — OXYMETAZOLINE HCL 0.05 % NA SOLN: 1.0000 | Freq: Two times a day (BID) | NASAL | 0 refills | Status: DC

## 2017-03-14 NOTE — Patient Instructions (Signed)
URI Instructions: Flonase and Afrin use: apply 1spray of afrin in each nare, wait 9mins, then apply 2sprays of flonase in each nare. Use both nasal spray consecutively x 3days, then flonase only for at least 14days.  Encourage adequate oral hydration.  Avoid decongestants if you have high blood pressure. Use" Delsym" or" Robitussin" cough syrup varietis for cough.  You can use plain "Tylenol" or "Advi"l for fever, chills and achyness.   "Common cold" symptoms are usually triggered by a virus.  The antibiotics are usually not necessary. On average, a" viral cold" illness would take 4-7 days to resolve. Please, make an appointment if you are not better or if you're worse.

## 2017-03-14 NOTE — Progress Notes (Signed)
Subjective:  Patient ID: [name] [name], male    DOB: [birth year]  Age: 66 y.o. MRN: [id number]  CC: Establish Care (est care); Cough (coughing,cold chills and hot,congestion,inside chest feels raw--2 days. ); and Blurred Vision (blur vison on right eye---no eye doctor--going on for w while. )  [name] [name] is here to establish care and discuss cough, nasal congestion, and abnormal vision in right eye. Has No previous pcp.  URI   This is a new problem. The current episode started yesterday. The problem has been unchanged. There has been no fever. Associated symptoms include congestion, coughing, headaches, a plugged ear sensation, rhinorrhea, sinus pain, sneezing, a sore throat and wheezing. Pertinent negatives include no chest pain, nausea, swollen glands or vomiting. He has tried nothing for the symptoms.  Eye Problem   The right eye is affected. This is a chronic problem. The current episode started more than 1 year ago. The problem occurs constantly. The problem has been gradually worsening. There was no injury mechanism. The patient is experiencing no pain. There is no known exposure to pink eye. He does not wear contacts. Associated symptoms include blurred vision and a recent URI. Pertinent negatives include no eye discharge, double vision, eye redness, fever, foreign body sensation, itching, nausea, photophobia or vomiting. He has tried nothing for the symptoms.   Right eye blurry vision: Ongoing for several years. Will like ophthalmology referral.  Outpatient Medications Prior to Visit  Medication Sig Dispense Refill  . aspirin 81 MG tablet Take 1 tablet (81 mg total) by mouth daily.    . carvedilol (COREG) 12.5 MG tablet TAKE ONE TABLET BY MOUTH TWICE DAILY 180 tablet 3  . cyclobenzaprine (FLEXERIL) 10 MG tablet Take 1 tablet (10 mg total) by mouth 2 (two) times daily as needed for muscle spasms. 60 tablet 0  . eplerenone (INSPRA) 25 MG tablet Take 1 tablet (25 mg total) by mouth  daily. 14 tablet 0  . pantoprazole (PROTONIX) 40 MG tablet Take 1 tablet (40 mg total) by mouth daily. 30 tablet 11  . pregabalin (LYRICA) 75 MG capsule Take 1 capsule (75 mg total) by mouth 2 (two) times daily. 180 capsule 2  . sacubitril-valsartan (ENTRESTO) 24-26 MG Take 1 tablet by mouth 2 (two) times daily. 60 tablet 11  . traMADol (ULTRAM) 50 MG tablet TAKE ONE TABLET BY MOUTH EVERY 4 TO 6 HOURS AS NEEDED FOR PAIN 90 tablet 0   No facility-administered medications prior to visit.     ROS Review of Systems  Constitutional: Negative.  Negative for fever.  HENT: Positive for congestion, rhinorrhea, sinus pain, sneezing and sore throat.   Eyes: Positive for blurred vision and pain. Negative for double vision, photophobia, discharge, redness and itching.  Respiratory: Positive for cough, sputum production and wheezing. Negative for shortness of breath.   Cardiovascular: Negative for chest pain.  Gastrointestinal: Negative for nausea and vomiting.  Musculoskeletal: Negative for falls.  Skin: Negative.   Neurological: Positive for headaches.    Objective:  BP 122/78   Pulse 76   Temp 97.8 F (36.6 C)   Ht 5' 7.5" (1.715 m)   Wt 169 lb (76.7 kg)   SpO2 96%   BMI 26.08 kg/m   BP Readings from Last 3 Encounters:  03/14/17 122/78  12/25/16 116/63  05/03/16 102/70    Wt Readings from Last 3 Encounters:  03/14/17 169 lb (76.7 kg)  12/25/16 175 lb 4 oz (79.5 kg)  05/03/16 173 lb 9.6 oz (78.7  kg)    Physical Exam  Constitutional: He is oriented to person, place, and time. No distress.  HENT:  Right Ear: Tympanic membrane, external ear and ear canal normal.  Left Ear: Tympanic membrane, external ear and ear canal normal.  Nose: Mucosal edema and rhinorrhea present. Right sinus exhibits no maxillary sinus tenderness and no frontal sinus tenderness. Left sinus exhibits no maxillary sinus tenderness and no frontal sinus tenderness.  Mouth/Throat: Uvula is midline. Posterior  oropharyngeal erythema present.  Eyes: Conjunctivae and EOM are normal. Pupils are equal, round, and reactive to light. Right eye exhibits no discharge. Left eye exhibits no discharge. No scleral icterus.  Neck: Normal range of motion. Neck supple. No thyromegaly present.  Cardiovascular: Normal rate and regular rhythm.  Pulmonary/Chest: Effort normal. He has wheezes. He has no rales.  Musculoskeletal: He exhibits no edema.  Lymphadenopathy:    He has no cervical adenopathy.  Neurological: He is alert and oriented to person, place, and time.  Psychiatric: He has a normal mood and affect. His behavior is normal.  Vitals reviewed.   Lab Results  Component Value Date   WBC 4.9 12/25/2016   HGB 14.9 12/25/2016   HCT 43.2 12/25/2016   PLT 129 (L) 12/25/2016   GLUCOSE 101 (H) 12/25/2016   CHOL 215 (H) 12/25/2016   TRIG 345 (H) 12/25/2016   HDL 40 (L) 12/25/2016   LDLDIRECT 169.0 04/24/2014   LDLCALC 106 (H) 12/25/2016   ALT 20 09/13/2015   AST 19 09/13/2015   NA 136 12/25/2016   K 4.5 12/25/2016   CL 106 12/25/2016   CREATININE 1.02 12/25/2016   BUN 11 12/25/2016   CO2 22 12/25/2016   TSH 3.153 12/25/2016   PSA 0.45 05/12/2010   INR 1.12 09/13/2015    Assessment & Plan:   [name] [name] was seen today for establish care, cough and blurred vision.  Diagnoses and all orders for this visit:  Acute URI -     fluticasone (FLONASE) 50 MCG/ACT nasal spray; Place 2 sprays into both nostrils daily. -     oxymetazoline (AFRIN NASAL SPRAY) 0.05 % nasal spray; Place 1 spray into both nostrils 2 (two) times daily. Use only for 3days, then stop -     guaiFENesin (MUCINEX) 600 MG 12 hr tablet; Take 1 tablet (600 mg total) by mouth 2 (two) times daily as needed for cough or to loosen phlegm. -     benzonatate (TESSALON) 100 MG capsule; Take 1 capsule (100 mg total) by mouth 3 (three) times daily as needed for cough. -     albuterol (PROVENTIL HFA;VENTOLIN HFA) 108 (90 Base) MCG/ACT inhaler; Inhale  1 puff into the lungs every 6 (six) hours as needed for wheezing or shortness of breath.  Blurry vision, right eye -     Ambulatory referral to Ophthalmology   I am having [name] [name]. [name] [name] start on fluticasone, oxymetazoline, guaiFENesin, benzonatate, and albuterol. I am also having him maintain his aspirin, carvedilol, pantoprazole, eplerenone, cyclobenzaprine, traMADol, sacubitril-valsartan, and pregabalin.  Meds ordered this encounter  Medications  . fluticasone (FLONASE) 50 MCG/ACT nasal spray    Sig: Place 2 sprays into both nostrils daily.    Dispense:  16 g    Refill:  0    Order Specific Question:   Supervising Provider    Answer:   Lucille Passy [[id number]]  . oxymetazoline (AFRIN NASAL SPRAY) 0.05 % nasal spray    Sig: Place 1 spray into both nostrils 2 (two) times daily.  Use only for 3days, then stop    Dispense:  30 mL    Refill:  0    Order Specific Question:   Supervising Provider    Answer:   Lucille Passy [[id number]]  . guaiFENesin (MUCINEX) 600 MG 12 hr tablet    Sig: Take 1 tablet (600 mg total) by mouth 2 (two) times daily as needed for cough or to loosen phlegm.    Dispense:  14 tablet    Refill:  0    Order Specific Question:   Supervising Provider    Answer:   Lucille Passy [[id number]]  . benzonatate (TESSALON) 100 MG capsule    Sig: Take 1 capsule (100 mg total) by mouth 3 (three) times daily as needed for cough.    Dispense:  20 capsule    Refill:  0    Order Specific Question:   Supervising Provider    Answer:   Lucille Passy [[id number]]  . albuterol (PROVENTIL HFA;VENTOLIN HFA) 108 (90 Base) MCG/ACT inhaler    Sig: Inhale 1 puff into the lungs every 6 (six) hours as needed for wheezing or shortness of breath.    Dispense:  1 Inhaler    Refill:  0    Order Specific Question:   Supervising Provider    Answer:   Lucille Passy [[id number]]    Follow-up: Return in about 3 months (around 06/12/2017) for CPE(fasting).  Wilfred Lacy, NP

## 2017-03-15 ENCOUNTER — Encounter: Payer: Self-pay | Admitting: Nurse Practitioner

## 2017-03-26 ENCOUNTER — Ambulatory Visit (HOSPITAL_COMMUNITY)
Admission: RE | Admit: 2017-03-26 | Discharge: 2017-03-26 | Disposition: A | Discharge status: Home / Self Care | Payer: Medicare HMO | Source: Ambulatory Visit | Attending: Internal Medicine | Admitting: Internal Medicine

## 2017-03-26 ENCOUNTER — Telehealth (HOSPITAL_COMMUNITY): Payer: Self-pay | Admitting: *Deleted

## 2017-03-26 DIAGNOSIS — I428 Other cardiomyopathies: Secondary | ICD-10-CM | POA: Diagnosis not present

## 2017-03-26 LAB — BASIC METABOLIC PANEL
ANION GAP: 6 (ref 5–15)
BUN: 7 mg/dL (ref 6–20)
CO2: 27 mmol/L (ref 22–32)
Calcium: 8.9 mg/dL (ref 8.9–10.3)
Chloride: 104 mmol/L (ref 101–111)
Creatinine, Ser: 1.09 mg/dL (ref 0.61–1.24)
GFR calc Af Amer: >60 mL/min (ref >60)
GLUCOSE: 99 mg/dL (ref 65–99)
POTASSIUM: 4.4 mmol/L (ref 3.5–5.1)
SODIUM: 137 mmol/L (ref 135–145)

## 2017-03-26 NOTE — Telephone Encounter (Signed)
Medication Samples have been provided to the patient.  Drug name: Delene Loll       Strength: 24-26  mg      Qty: 4  LOT: [id number] Exp.Date: 06/21  Dosing instructions: Take 1 Tablet Twice Daily  The patient has been instructed regarding the correct time, dose, and frequency of taking this medication, including desired effects and most common side effects.   Darron Doom 2:04 PM 03/26/2017

## 2017-03-31 ENCOUNTER — Other Ambulatory Visit (HOSPITAL_COMMUNITY): Payer: Self-pay | Admitting: Internal Medicine

## 2017-04-11 ENCOUNTER — Telehealth (HOSPITAL_COMMUNITY): Payer: Self-pay | Admitting: Pharmacist

## 2017-04-11 NOTE — Telephone Encounter (Signed)
PANF renewed so that [name] [name] will have $1000 to use toward his Delene Loll copay costs through 01/18/18.   Member ID: [id number] Group ID: [id number] RxBin ID: [id number] PCN: [id number] Eligibility Start Date: 01/19/2017 Eligibility End Date: 01/18/2018 Assistance Amount: $1,000.00   Doroteo Bradford K. Velva Harman, PharmD, BCPS, CPP Clinical Pharmacist Pager: [phone] Phone: [phone] 04/11/2017 2:28 PM

## 2017-04-26 ENCOUNTER — Telehealth (HOSPITAL_COMMUNITY): Payer: Self-pay

## 2017-04-26 NOTE — Telephone Encounter (Signed)
Medication Samples have been provided to the patient.  Drug name: Delene Loll       Strength: 24/26        Qty: 4  LOT: [id number]  Exp.Date: 6/21  Dosing instructions: Take 1 tab twice a day  The patient has been instructed regarding the correct time, dose, and frequency of taking this medication, including desired effects and most common side effects.   Shirley Muscat 11:28 AM 04/26/2017

## 2017-05-28 ENCOUNTER — Other Ambulatory Visit (HOSPITAL_COMMUNITY): Payer: Self-pay | Admitting: *Deleted

## 2017-05-28 ENCOUNTER — Other Ambulatory Visit: Payer: Self-pay | Admitting: Physician Assistant

## 2017-05-28 MED ORDER — SACUBITRIL-VALSARTAN 24-26 MG PO TABS: 1.0000 | ORAL_TABLET | Freq: Two times a day (BID) | ORAL | 11 refills | Status: DC

## 2017-05-28 NOTE — Telephone Encounter (Signed)
Patient states he needs medication pantoprazole refilled. Pt scheduled for follow up on 3.4.19

## 2017-05-29 ENCOUNTER — Other Ambulatory Visit: Payer: Self-pay | Admitting: *Deleted

## 2017-05-29 MED ORDER — PANTOPRAZOLE SODIUM 40 MG PO TBEC: 40.0000 mg | DELAYED_RELEASE_TABLET | Freq: Every day | ORAL | 3 refills | Status: DC

## 2017-06-11 ENCOUNTER — Ambulatory Visit: Payer: Medicare HMO | Admitting: Physician Assistant

## 2017-07-02 ENCOUNTER — Encounter: Payer: Medicare HMO | Admitting: Nurse Practitioner

## 2017-07-02 DIAGNOSIS — Z0289 Encounter for other administrative examinations: Secondary | ICD-10-CM

## 2017-07-04 ENCOUNTER — Other Ambulatory Visit (INDEPENDENT_AMBULATORY_CARE_PROVIDER_SITE_OTHER): Payer: Self-pay | Admitting: Specialist

## 2017-07-04 NOTE — Telephone Encounter (Signed)
[facility]  [phone] Please fax   Prescription expired please send new scrip [facility]   Med refill  Lyrica 75mg      Please call pt when information is faxed

## 2017-07-06 MED ORDER — PREGABALIN 75 MG PO CAPS: 75.0000 mg | ORAL_CAPSULE | Freq: Two times a day (BID) | ORAL | 2 refills | Status: DC

## 2017-07-06 NOTE — Telephone Encounter (Signed)
Faxed to number provided by patient. I called patient and advised.

## 2017-07-10 ENCOUNTER — Other Ambulatory Visit (INDEPENDENT_AMBULATORY_CARE_PROVIDER_SITE_OTHER): Payer: Self-pay | Admitting: Specialist

## 2017-07-10 NOTE — Telephone Encounter (Signed)
Patient requesting RX refill on Tramadol, he uses [facility] on [address].

## 2017-07-11 NOTE — Telephone Encounter (Signed)
Sent to [facility] for Tramadol refill request

## 2017-07-12 MED ORDER — TRAMADOL HCL 50 MG PO TABS: ORAL_TABLET | ORAL | 0 refills | Status: DC

## 2017-07-13 NOTE — Telephone Encounter (Signed)
I called rx to [facility], I call and lmom to advise patient

## 2017-07-17 ENCOUNTER — Telehealth (INDEPENDENT_AMBULATORY_CARE_PROVIDER_SITE_OTHER): Payer: Self-pay | Admitting: Specialist

## 2017-07-17 NOTE — Telephone Encounter (Signed)
Please call patient in regards to your prescription. It was sent to [facility]. [facility] never recievd it.  Please call patient.

## 2017-07-19 ENCOUNTER — Other Ambulatory Visit (INDEPENDENT_AMBULATORY_CARE_PROVIDER_SITE_OTHER): Payer: Self-pay | Admitting: Radiology

## 2017-07-19 MED ORDER — PREGABALIN 75 MG PO CAPS: 75.0000 mg | ORAL_CAPSULE | Freq: Two times a day (BID) | ORAL | 2 refills | Status: DC

## 2017-07-23 ENCOUNTER — Other Ambulatory Visit (INDEPENDENT_AMBULATORY_CARE_PROVIDER_SITE_OTHER): Payer: Self-pay | Admitting: Radiology

## 2017-07-23 ENCOUNTER — Telehealth (INDEPENDENT_AMBULATORY_CARE_PROVIDER_SITE_OTHER): Payer: Self-pay | Admitting: Specialist

## 2017-07-23 MED ORDER — PREGABALIN 75 MG PO CAPS: 75.0000 mg | ORAL_CAPSULE | Freq: Two times a day (BID) | ORAL | 2 refills | Status: DC

## 2017-07-23 NOTE — Telephone Encounter (Signed)
I have sent info to [facility]

## 2017-07-23 NOTE — Telephone Encounter (Signed)
I have faxed info to the pharmacy

## 2017-07-23 NOTE — Telephone Encounter (Signed)
Patient called wanting to get his refill on his Lyrica if the RX could be faxed to either [phone] or [phone] account # [id number]

## 2017-07-25 ENCOUNTER — Telehealth (HOSPITAL_COMMUNITY): Payer: Self-pay | Admitting: Pharmacist

## 2017-07-25 ENCOUNTER — Other Ambulatory Visit (HOSPITAL_COMMUNITY): Payer: Self-pay | Admitting: *Deleted

## 2017-07-25 NOTE — Telephone Encounter (Signed)
[name] [name] called asking for more refills on his Inspra which he receives through [employer] patient assistance program. I have called and ordered a refill for him which should arrive at our clinic within 7-10 business days. He will need to fill out a new application for continued assistance since his current application expires on 0/88/11.   ID # [id number]  Ruta Hinds. Velva Harman, PharmD, BCPS, CPP Clinical Pharmacist Phone: [phone] 07/25/2017 11:02 AM

## 2017-08-01 NOTE — Telephone Encounter (Signed)
Received pt's Inspra from [facility], pt is aware, he will p/u from front desk tomorrow

## 2017-08-15 ENCOUNTER — Ambulatory Visit (HOSPITAL_COMMUNITY)
Admission: RE | Admit: 2017-08-15 | Discharge: 2017-08-15 | Disposition: A | Discharge status: Home / Self Care | Payer: Medicare HMO | Source: Ambulatory Visit | Attending: Cardiology | Admitting: Cardiology

## 2017-08-15 ENCOUNTER — Other Ambulatory Visit: Payer: Self-pay

## 2017-08-15 ENCOUNTER — Encounter (HOSPITAL_COMMUNITY): Payer: Self-pay | Admitting: Cardiology

## 2017-08-15 VITALS — BP 144/83 | HR 59 | Wt 178.2 lb

## 2017-08-15 DIAGNOSIS — Z7982 Long term (current) use of aspirin: Secondary | ICD-10-CM | POA: Insufficient documentation

## 2017-08-15 DIAGNOSIS — R945 Abnormal results of liver function studies: Secondary | ICD-10-CM

## 2017-08-15 DIAGNOSIS — Z79899 Other long term (current) drug therapy: Secondary | ICD-10-CM | POA: Insufficient documentation

## 2017-08-15 DIAGNOSIS — Z823 Family history of stroke: Secondary | ICD-10-CM | POA: Insufficient documentation

## 2017-08-15 DIAGNOSIS — I5022 Chronic systolic (congestive) heart failure: Secondary | ICD-10-CM | POA: Diagnosis not present

## 2017-08-15 DIAGNOSIS — Z79891 Long term (current) use of opiate analgesic: Secondary | ICD-10-CM | POA: Diagnosis not present

## 2017-08-15 DIAGNOSIS — E785 Hyperlipidemia, unspecified: Secondary | ICD-10-CM | POA: Diagnosis not present

## 2017-08-15 DIAGNOSIS — G4733 Obstructive sleep apnea (adult) (pediatric): Secondary | ICD-10-CM | POA: Diagnosis not present

## 2017-08-15 DIAGNOSIS — Z8249 Family history of ischemic heart disease and other diseases of the circulatory system: Secondary | ICD-10-CM | POA: Diagnosis not present

## 2017-08-15 DIAGNOSIS — Z09 Encounter for follow-up examination after completed treatment for conditions other than malignant neoplasm: Secondary | ICD-10-CM | POA: Diagnosis not present

## 2017-08-15 DIAGNOSIS — M1710 Unilateral primary osteoarthritis, unspecified knee: Secondary | ICD-10-CM | POA: Diagnosis not present

## 2017-08-15 DIAGNOSIS — I130 Hypertensive heart and chronic kidney disease with heart failure and stage 1 through stage 4 chronic kidney disease, or unspecified chronic kidney disease: Secondary | ICD-10-CM | POA: Insufficient documentation

## 2017-08-15 DIAGNOSIS — K219 Gastro-esophageal reflux disease without esophagitis: Secondary | ICD-10-CM | POA: Diagnosis not present

## 2017-08-15 DIAGNOSIS — E781 Pure hyperglyceridemia: Secondary | ICD-10-CM | POA: Diagnosis not present

## 2017-08-15 DIAGNOSIS — R7989 Other specified abnormal findings of blood chemistry: Secondary | ICD-10-CM

## 2017-08-15 DIAGNOSIS — I429 Cardiomyopathy, unspecified: Secondary | ICD-10-CM | POA: Insufficient documentation

## 2017-08-15 LAB — BASIC METABOLIC PANEL
ANION GAP: 9 (ref 5–15)
BUN: 10 mg/dL (ref 6–20)
CHLORIDE: 106 mmol/L (ref 101–111)
CO2: 24 mmol/L (ref 22–32)
Calcium: 8.9 mg/dL (ref 8.9–10.3)
Creatinine, Ser: 1.04 mg/dL (ref 0.61–1.24)
GFR calc non Af Amer: >60 mL/min (ref >60)
Glucose, Bld: 99 mg/dL (ref 65–99)
POTASSIUM: 4.6 mmol/L (ref 3.5–5.1)
SODIUM: 139 mmol/L (ref 135–145)

## 2017-08-15 LAB — LIPID PANEL
Cholesterol: 219 mg/dL — ABNORMAL HIGH (ref 0–200)
HDL: 41 mg/dL (ref >40)
LDL Cholesterol: 135 mg/dL — ABNORMAL HIGH (ref 0–99)
Total CHOL/HDL Ratio: 5.3 RATIO
Triglycerides: 216 mg/dL — ABNORMAL HIGH (ref <150)
VLDL: 43 mg/dL — ABNORMAL HIGH (ref 0–40)

## 2017-08-15 LAB — PSA: Prostatic Specific Antigen: 0.79 ng/mL (ref 0.00–4.00)

## 2017-08-15 NOTE — Patient Instructions (Signed)
Labs drawn today (if we do not call you, then your lab work was stable)   Your physician recommends that you schedule a follow-up appointment in: 6 months with Dr. McLean  Please Call an Schedule Appointment    

## 2017-08-16 NOTE — Progress Notes (Signed)
Patient ID: [name] [name], male   DOB: June 19, 1950, 67 y.o.   MRN: [id number] Pulmonary: Dr Elsworth Soho PCP: none  Cardiology: Dr. Aundra Dubin  67 yo with history of nonischemic cardiomyopathy presents for followup of CHF.  He has been doing well. Very active, goes to gym 4-5 times/week and dances on the weekends. Able to push mow his large yard.  No exertional dyspnea. No chest pain.  No orthopnea/PND. Most recent echo in 10/18 showed improvement in EF to 50-55%.   Labs (12/09): SPEP negative, HIV negative, transferrin saturation 29%, BNP 24 Labs (9/10): direct LDL 54, HDL 13.8, triglycerides 1470, creatinine 0.8 Labs (05/11/09): BNP 27, WBCs 5.6 Labs (5/11): K 4.6, creatinine 1.2, TGs 238, LDL 108, HDL 34 Labs (8/11): K 4.3, creatinine 1.4, BNP 12.6 Labs (2/12): K 4.1, creatinine 1.7, LDL 102 Labs (8/12): K 4.7, creatinine 1.5 Labs (4/13): LDL 132, HDL 33.4, AST 41, ALT 63 Labs (7/13): K 4, creatinine 1.2, ALT 59, AST 36, HCV negative Labs (1/14): K 4.1, creatinine 1.5 Labs (04/24/14): Cholesterol 222, TGL 265, HDL 37, LDL 169 Labs (4/16): LDL 110, HDl 38 Labs (9/16): LDL 116, HDL 36, TGs 175 Labs (01/2015): K 3.8 Creatinine 1.2 => 1.08, HCT 37 Labs (3/17): creatinine 1.07, BNP 8.9 Labs (6/17): K 4.2, creatinine 1.0 Labs (9/18): TGs 215, LDL 106, HDL 40 Labs (12/18): K 4.4, creatinine 1.09  Allergies (verified):  No Known Drug Allergies  Past Medical History: 1. Nonischemic cardiomyopathy.  The patient had a left heart catheterization done in March 2004 with normal coronaries and then in November 2008, he had a Myoview done that showed an EF of 39% with no ischemia.  Echo in April 2008 showed an EF of 35-40%, moderate diffuse hypokinesis, mild left atrial enlargement, normal RV size and function and essentially normal valves.  The cause of his cardiomyopathy has not been discovered. He drinks but not extremely heavily.  He has never used cocaine and amphetamines.  HIV, SPEP, and ferritin/Fe studies  were all negative in 12/09.  Echo (9/10): EF 40%, mild to moderate global HK, mild LAE.  Myoview (5/11): EF 42% with apical thinning but no evidence for ischemia or infarction.  Echo (3/12): EF 45-50%, no significant valvular abnormalities. Echo (1/16) with EF 35-40%, diffuse hypokinesis.  - Cardiac MRI (3/17) with EF 43%, mild diffuse hypokinesis, mid-wall LGE in the mid septum, possible prior viral myocarditis.  - Echo (10/18): EF 50-55%, normal RV size and systolic function.  2. Hypertriglyceridemia 3. Rotator cuff tendonitis with a history of bilateral shoulder surgery. 4. Hypertension. 5. History of a back surgery. 6. Knee osteoarthritis, s/p arthroscopy 2010 7. Gastroesophageal reflux disease. EGD (1/12) with erosive esophagitis and gastritis, biopsy positive for H pylori (being treated). 8. Colonoscopy (1/12) with mild diverticulosis and hemorrhoids.  9. Allergic rhinitis 10. Gynecomastia with spironolactone (ok on eplerenone).  11. Probable mild intermittent asthma 12. Elevated LFTs: HCV negative, possibly due to ETOH 13. CKD 14. OSA: CPAP.  15. H/o herniated nucleus pulposus: s/p surgery 6/17.  Family History: No premature CAD.  Father with prostate cancer Mother with CVA Brother with nonischemic cardiomyopathy, has ICD Grandmother with cancer (unsure what)    Social History: The patient denies smoking.  Lives in [place].  He does not use any drugs. He has never used any illicit drugs other than marijuana, which he smoked occasionally in the distant past.  Moderate ETOH.He is on disability. He is divorced with two sons.     ROS:  All systems  reviewed and negative except as per HPI.    Current Outpatient Medications  Medication Sig Dispense Refill  . albuterol (PROVENTIL HFA;VENTOLIN HFA) 108 (90 Base) MCG/ACT inhaler Inhale 1 puff into the lungs every 6 (six) hours as needed for wheezing or shortness of breath. 1 Inhaler 0  . aspirin 81 MG tablet Take 1 tablet (81 mg  total) by mouth daily.    . benzonatate (TESSALON) 100 MG capsule Take 1 capsule (100 mg total) by mouth 3 (three) times daily as needed for cough. 20 capsule 0  . carvedilol (COREG) 12.5 MG tablet TAKE ONE TABLET BY MOUTH TWICE DAILY 180 tablet 3  . carvedilol (COREG) 12.5 MG tablet TAKE ONE TABLET BY MOUTH TWICE DAILY 180 tablet 3  . cyclobenzaprine (FLEXERIL) 10 MG tablet Take 1 tablet (10 mg total) by mouth 2 (two) times daily as needed for muscle spasms. 60 tablet 0  . eplerenone (INSPRA) 25 MG tablet Take 1 tablet (25 mg total) by mouth daily. 14 tablet 0  . fluticasone (FLONASE) 50 MCG/ACT nasal spray Place 2 sprays into both nostrils daily. 16 g 0  . guaiFENesin (MUCINEX) 600 MG 12 hr tablet Take 1 tablet (600 mg total) by mouth 2 (two) times daily as needed for cough or to loosen phlegm. 14 tablet 0  . oxymetazoline (AFRIN NASAL SPRAY) 0.05 % nasal spray Place 1 spray into both nostrils 2 (two) times daily. Use only for 3days, then stop 30 mL 0  . pantoprazole (PROTONIX) 40 MG tablet Take 1 tablet (40 mg total) by mouth daily. 90 tablet 3  . pregabalin (LYRICA) 75 MG capsule Take 1 capsule (75 mg total) by mouth 2 (two) times daily. 180 capsule 2  . sacubitril-valsartan (ENTRESTO) 24-26 MG Take 1 tablet by mouth 2 (two) times daily. 60 tablet 11  . traMADol (ULTRAM) 50 MG tablet TAKE ONE TABLET BY MOUTH EVERY 4 TO 6 HOURS AS NEEDED FOR PAIN 90 tablet 0   No current facility-administered medications for this encounter.     BP (!) 144/83   Pulse (!) 59   Wt 178 lb 4 oz (80.9 kg)   SpO2 100%   BMI 27.51 kg/m  General: NAD Neck: No JVD, no thyromegaly or thyroid nodule.  Lungs: Clear to auscultation bilaterally with normal respiratory effort. CV: Nondisplaced PMI.  Heart regular S1/S2, no S3/S4, no murmur.  No peripheral edema.  No carotid bruit.  Normal pedal pulses.  Abdomen: Soft, nontender, no hepatosplenomegaly, no distention.  Skin: Intact without lesions or rashes.   Neurologic: Alert and oriented x 3.  Psych: Normal affect. Extremities: No clubbing or cyanosis.  HEENT: Normal.   Assessment/Plan:  Chronic systolic heart failure  Nonischemic cardiomyopathy, ECHO 04/2014 with EF down to 35-40%.  Cardiac MRI in 3/17, however, showed EF 43% with mid-wall pattern of LGE in the septum that may be suggestive of prior myocarditis.  Echo in 10/18 showed EF up to 50-55. No heavy substance abuse (though occasionally binge drinks), HIV negative, SPEP negative, no evidence for hemochromatosis. Would also consider familial cardiomyopathy as his brother apparently also has a nonischemic cardiomyopathy and got an ICD.  NYHA I-II. Volume status stable. Not currently on lasix.  - Continue Inspra 25 mg daily.  - Continue carvedilol 12.5 mg twice a day.  - Continue Entresto 24/26 bid.  - I encouraged him again to limit ETOH.  - BMET today and will need BMET every 3 months on his current medication regimen.  Hyperlipidemia He is  not longer on a statin.  Check lipids today. OSA Unable to tolerate CPAP.   Followup in 6 months.   Loralie Champagne 08/16/2017

## 2017-08-20 ENCOUNTER — Encounter (HOSPITAL_COMMUNITY): Payer: Self-pay

## 2017-08-20 ENCOUNTER — Telehealth (HOSPITAL_COMMUNITY): Payer: Self-pay

## 2017-08-20 DIAGNOSIS — E785 Hyperlipidemia, unspecified: Secondary | ICD-10-CM

## 2017-08-20 MED ORDER — ROSUVASTATIN CALCIUM 5 MG PO TABS: 5.0000 mg | ORAL_TABLET | Freq: Every day | ORAL | 3 refills | Status: DC

## 2017-08-20 NOTE — Telephone Encounter (Signed)
Notes recorded by Shirley Muscat, RN on 08/20/2017 at 2:48 PM EDT Pt aware of results, agreeable to med changes (changes made in [facility]), Lipid Letter mailed to patient, and Lab appointment made (orders placed)    ------  Notes recorded by Harvie Junior, CMA on 08/20/2017 at 1:01 PM EDT Left VM ------  Notes recorded by Shirley Muscat, RN on 08/17/2017 at 10:05 AM EDT Left VM  ------  Notes recorded by Larey Dresser, MD on 08/16/2017 at 10:42 PM EDT LDL and TGs rising. Would suggest low dose Crestor 5 mg daily with lipids/LFTs in 2 months.

## 2017-10-22 ENCOUNTER — Ambulatory Visit (HOSPITAL_COMMUNITY)
Admission: RE | Admit: 2017-10-22 | Discharge: 2017-10-22 | Disposition: A | Discharge status: Home / Self Care | Payer: Medicare HMO | Source: Ambulatory Visit | Attending: Cardiology | Admitting: Cardiology

## 2017-10-22 DIAGNOSIS — E785 Hyperlipidemia, unspecified: Secondary | ICD-10-CM | POA: Diagnosis not present

## 2017-10-22 LAB — LIPID PANEL
CHOL/HDL RATIO: 5.6 ratio
CHOLESTEROL: 223 mg/dL — AB (ref 0–200)
HDL: 40 mg/dL — ABNORMAL LOW (ref >40)
LDL Cholesterol: 154 mg/dL — ABNORMAL HIGH (ref 0–99)
Triglycerides: 144 mg/dL (ref <150)
VLDL: 29 mg/dL (ref 0–40)

## 2017-10-22 LAB — HEPATIC FUNCTION PANEL
ALBUMIN: 3.8 g/dL (ref 3.5–5.0)
ALK PHOS: 57 U/L (ref 38–126)
ALT: 27 U/L (ref 0–44)
AST: 21 U/L (ref 15–41)
BILIRUBIN INDIRECT: 0.5 mg/dL (ref 0.3–0.9)
Bilirubin, Direct: 0.2 mg/dL (ref 0.0–0.2)
TOTAL PROTEIN: 6.8 g/dL (ref 6.5–8.1)
Total Bilirubin: 0.7 mg/dL (ref 0.3–1.2)

## 2017-10-23 ENCOUNTER — Encounter (HOSPITAL_COMMUNITY): Payer: Self-pay

## 2017-10-23 ENCOUNTER — Telehealth (HOSPITAL_COMMUNITY): Payer: Self-pay

## 2017-10-23 DIAGNOSIS — E785 Hyperlipidemia, unspecified: Secondary | ICD-10-CM

## 2017-10-23 NOTE — Telephone Encounter (Signed)
Notes recorded by Shirley Muscat, RN on 10/23/2017 at 11:17 AM EDT Pt states he has not even started Crestor 5 mg daily due to statins causing muscle pains. He has agreed to start Crestor and made lab appt. Lipid Letter mailed to patient  ------  Notes recorded by Larey Dresser, MD on 10/22/2017 at 8:11 PM EDT LDL too high, add Crestor 10 mg daily with lipids/LFTs 3 months.

## 2017-10-24 ENCOUNTER — Other Ambulatory Visit (INDEPENDENT_AMBULATORY_CARE_PROVIDER_SITE_OTHER): Payer: Self-pay | Admitting: Specialist

## 2017-10-24 NOTE — Telephone Encounter (Signed)
Pt is going to come by the office tomorrow to complete his part of the form.

## 2017-10-24 NOTE — Telephone Encounter (Signed)
Patient called stating need paper filled out from [facility] to get Lyrica.  Please call patient to advise.  [phone]

## 2017-10-26 MED ORDER — PREGABALIN 75 MG PO CAPS: 75.0000 mg | ORAL_CAPSULE | Freq: Two times a day (BID) | ORAL | 2 refills | Status: DC

## 2017-10-29 ENCOUNTER — Other Ambulatory Visit (INDEPENDENT_AMBULATORY_CARE_PROVIDER_SITE_OTHER): Payer: Self-pay | Admitting: Specialist

## 2017-10-29 MED ORDER — PREGABALIN 75 MG PO CAPS: 75.0000 mg | ORAL_CAPSULE | Freq: Two times a day (BID) | ORAL | 2 refills | Status: DC

## 2017-12-04 ENCOUNTER — Other Ambulatory Visit (HOSPITAL_COMMUNITY): Payer: Self-pay | Admitting: *Deleted

## 2017-12-05 ENCOUNTER — Telehealth (HOSPITAL_COMMUNITY): Payer: Self-pay | Admitting: *Deleted

## 2017-12-05 NOTE — Telephone Encounter (Signed)
Ordered Inspra refill from [facility] which will arrive at clinic in 7-10 business days (order [phone]).   Ruta Hinds. Velva Harman, PharmD, BCPS, CPP Clinical Pharmacist Phone: [phone] 12/05/2017 1:56 PM

## 2017-12-05 NOTE — Telephone Encounter (Signed)
Patient requests refill of Inspra from [employer].  Routed to [place].

## 2017-12-17 ENCOUNTER — Other Ambulatory Visit (HOSPITAL_COMMUNITY): Payer: Self-pay

## 2017-12-17 MED ORDER — EPLERENONE 25 MG PO TABS: 25.0000 mg | ORAL_TABLET | Freq: Every day | ORAL | 3 refills | Status: DC

## 2017-12-19 NOTE — Telephone Encounter (Signed)
Received pt's Inspra from [employer], left pt message that med is at front desk for him to p/u

## 2017-12-24 ENCOUNTER — Other Ambulatory Visit (HOSPITAL_COMMUNITY): Payer: Medicare HMO

## 2017-12-24 ENCOUNTER — Telehealth (HOSPITAL_COMMUNITY): Payer: Self-pay | Admitting: *Deleted

## 2017-12-24 NOTE — Telephone Encounter (Signed)
Pt was sch for labs today to check lipid/liver.  He left a VM on triage line earlier stating he had a physical and labs done this morning at [facility] and wanted [place] to get results from them so he didn't have to come and get stuck again.  Spoke w/[facility], they state they will fax results to [place] tomorrow when they are available.  Attempted to call pt and left message lab appt cancelled.

## 2017-12-27 ENCOUNTER — Telehealth (HOSPITAL_COMMUNITY): Payer: Self-pay | Admitting: Cardiology

## 2017-12-27 DIAGNOSIS — E781 Pure hyperglyceridemia: Secondary | ICD-10-CM

## 2017-12-27 MED ORDER — ROSUVASTATIN CALCIUM 40 MG PO TABS: 40.0000 mg | ORAL_TABLET | Freq: Every day | ORAL | 11 refills | Status: DC

## 2017-12-27 NOTE — Telephone Encounter (Signed)
Abnormal labs received Labs drawn 12/24/17 Cr 1.02 BUN 11 K 4.6 Cholesterol 199 LDL 146 HDL 40 Triglycerides 174  Per Andy Tillery,PA/ Dr [facility] Increase crestor to 40 mg daily Recheck x 3 months  Patient aware

## 2017-12-28 ENCOUNTER — Other Ambulatory Visit (HOSPITAL_COMMUNITY): Payer: Self-pay | Admitting: Cardiology

## 2018-02-25 ENCOUNTER — Telehealth (HOSPITAL_COMMUNITY): Payer: Self-pay | Admitting: Pharmacist

## 2018-02-25 ENCOUNTER — Other Ambulatory Visit (HOSPITAL_COMMUNITY): Payer: Self-pay | Admitting: Internal Medicine

## 2018-02-25 MED ORDER — CARVEDILOL 12.5 MG PO TABS: 12.5000 mg | ORAL_TABLET | Freq: Two times a day (BID) | ORAL | 0 refills | Status: DC

## 2018-02-25 NOTE — Telephone Encounter (Signed)
Patient called stating that his PANF grant has expired and he cannot afford his $400 copay for Entresto. Since PANF is out of funding, have offered him to apply through [employer] patient assistance. Will leave application at front desk and patient knows to bring in income verification and pharmacy print out as well.   Ruta Hinds. Velva Harman, PharmD, BCPS, CPP Clinical Pharmacist Phone: [phone] 02/25/2018 1:37 PM

## 2018-03-04 ENCOUNTER — Other Ambulatory Visit (HOSPITAL_COMMUNITY): Payer: Self-pay

## 2018-03-04 MED ORDER — SACUBITRIL-VALSARTAN 24-26 MG PO TABS: 1.0000 | ORAL_TABLET | Freq: Two times a day (BID) | ORAL | 11 refills | Status: DC

## 2018-03-11 ENCOUNTER — Other Ambulatory Visit (INDEPENDENT_AMBULATORY_CARE_PROVIDER_SITE_OTHER): Payer: Self-pay | Admitting: Specialist

## 2018-03-11 ENCOUNTER — Telehealth (HOSPITAL_COMMUNITY): Payer: Self-pay | Admitting: Pharmacist

## 2018-03-11 NOTE — Telephone Encounter (Signed)
Tramadol refill request 

## 2018-03-11 NOTE — Telephone Encounter (Signed)
[employer] patient assistance approved for [employer] 24-26 mg BID through 04/09/18.   Ruta Hinds. Velva Harman, PharmD, BCPS, CPP Clinical Pharmacist Phone: [phone] 03/11/2018 10:39 AM

## 2018-03-13 ENCOUNTER — Encounter: Payer: Self-pay | Admitting: Nurse Practitioner

## 2018-03-13 ENCOUNTER — Ambulatory Visit (INDEPENDENT_AMBULATORY_CARE_PROVIDER_SITE_OTHER): Payer: Medicare HMO | Admitting: Nurse Practitioner

## 2018-03-13 VITALS — BP 120/76 | HR 70 | Temp 98.5°F | Ht 67.5 in | Wt 175.0 lb

## 2018-03-13 DIAGNOSIS — J209 Acute bronchitis, unspecified: Secondary | ICD-10-CM

## 2018-03-13 DIAGNOSIS — J00 Acute nasopharyngitis [common cold]: Secondary | ICD-10-CM

## 2018-03-13 MED ORDER — HYDROCODONE-HOMATROPINE 5-1.5 MG/5ML PO SYRP: 5.0000 mL | ORAL_SOLUTION | Freq: Every evening | ORAL | 0 refills | Status: DC | PRN

## 2018-03-13 MED ORDER — AZITHROMYCIN 250 MG PO TABS: 250.0000 mg | ORAL_TABLET | Freq: Every day | ORAL | 0 refills | Status: AC

## 2018-03-13 MED ORDER — IPRATROPIUM BROMIDE 0.03 % NA SOLN: 2.0000 | Freq: Two times a day (BID) | NASAL | 0 refills | Status: DC

## 2018-03-13 MED ORDER — METHYLPREDNISOLONE ACETATE 40 MG/ML IJ SUSP
40.0000 mg | Freq: Once | INTRAMUSCULAR | Status: AC
  Administered 2018-03-13: 40 mg via INTRAMUSCULAR

## 2018-03-13 MED ORDER — AZITHROMYCIN 250 MG PO TABS: 250.0000 mg | ORAL_TABLET | Freq: Every day | ORAL | 0 refills | Status: DC

## 2018-03-13 MED ORDER — ALBUTEROL SULFATE HFA 108 (90 BASE) MCG/ACT IN AERS: 1.0000 | INHALATION_SPRAY | Freq: Four times a day (QID) | RESPIRATORY_TRACT | 0 refills | Status: DC | PRN

## 2018-03-13 MED ORDER — GUAIFENESIN-DM 100-10 MG/5ML PO SYRP: 5.0000 mL | ORAL_SOLUTION | ORAL | 0 refills | Status: DC | PRN

## 2018-03-13 MED ORDER — SALINE SPRAY 0.65 % NA SOLN: 1.0000 | NASAL | 0 refills | Status: DC | PRN

## 2018-03-13 NOTE — Patient Instructions (Signed)
Please sign medical release to get records from previous pcp.(recent CPE, lab results and immunization record).  Maintain adequate oral hydration.  Stay azithromycin if no improvement by Friday.

## 2018-03-13 NOTE — Progress Notes (Signed)
Subjective:  Patient ID: [name] [name], male    DOB: October 28, 1950  Age: 67 y.o. MRN: [id number]  CC: Nasal Congestion (pt is complaining of congestions,ears itchy,runny nose,coughing/ started 4 days/ tried sinus otc. )  URI   This is a new problem. The current episode started in the past 7 days. The problem has been unchanged. Associated symptoms include congestion, coughing, ear pain, headaches, joint pain, a plugged ear sensation, rhinorrhea, sinus pain, sneezing, a sore throat, swollen glands and wheezing. Pertinent negatives include no chest pain, nausea or neck pain. He has tried decongestant for the symptoms. The treatment provided mild relief.  influenza and prevenar 13 administered 01/2018 per patient.  Reviewed past Medical, Social and Family history today.  Outpatient Medications Prior to Visit  Medication Sig Dispense Refill  . ALPRAZolam (XANAX) 0.5 MG tablet     . aspirin 81 MG tablet Take 1 tablet (81 mg total) by mouth daily.    . carvedilol (COREG) 12.5 MG tablet Take 1 tablet (12.5 mg total) by mouth 2 (two) times daily. 180 tablet 0  . eplerenone (INSPRA) 25 MG tablet Take 1 tablet (25 mg total) by mouth daily. 30 tablet 3  . pantoprazole (PROTONIX) 40 MG tablet Take 1 tablet (40 mg total) by mouth daily. 90 tablet 3  . pregabalin (LYRICA) 75 MG capsule Take 1 capsule (75 mg total) by mouth 2 (two) times daily. 180 capsule 2  . rosuvastatin (CRESTOR) 40 MG tablet Take 1 tablet (40 mg total) by mouth daily. 30 tablet 11  . sacubitril-valsartan (ENTRESTO) 24-26 MG Take 1 tablet by mouth 2 (two) times daily. 60 tablet 11  . albuterol (PROVENTIL HFA;VENTOLIN HFA) 108 (90 Base) MCG/ACT inhaler Inhale 1 puff into the lungs every 6 (six) hours as needed for wheezing or shortness of breath. (Patient not taking: Reported on 03/13/2018) 1 Inhaler 0  . benzonatate (TESSALON) 100 MG capsule Take 1 capsule (100 mg total) by mouth 3 (three) times daily as needed for cough. (Patient not  taking: Reported on 03/13/2018) 20 capsule 0  . cyclobenzaprine (FLEXERIL) 10 MG tablet Take 1 tablet (10 mg total) by mouth 2 (two) times daily as needed for muscle spasms. (Patient not taking: Reported on 03/13/2018) 60 tablet 0  . fluticasone (FLONASE) 50 MCG/ACT nasal spray Place 2 sprays into both nostrils daily. (Patient not taking: Reported on 03/13/2018) 16 g 0  . guaiFENesin (MUCINEX) 600 MG 12 hr tablet Take 1 tablet (600 mg total) by mouth 2 (two) times daily as needed for cough or to loosen phlegm. (Patient not taking: Reported on 03/13/2018) 14 tablet 0  . oxymetazoline (AFRIN NASAL SPRAY) 0.05 % nasal spray Place 1 spray into both nostrils 2 (two) times daily. Use only for 3days, then stop (Patient not taking: Reported on 03/13/2018) 30 mL 0  . traMADol (ULTRAM) 50 MG tablet TAKE ONE TABLET BY MOUTH EVERY 4 TO 6 HOURS AS NEEDED FOR PAIN (Patient not taking: Reported on 03/13/2018) 90 tablet 0   No facility-administered medications prior to visit.     ROS See HPI  Objective:  BP 120/76   Pulse 70   Temp 98.5 F (36.9 C) (Oral)   Ht 5' 7.5" (1.715 m)   Wt 175 lb (79.4 kg)   SpO2 97%   BMI 27.00 kg/m   BP Readings from Last 3 Encounters:  03/13/18 120/76  08/15/17 (!) 144/83  03/14/17 122/78    Wt Readings from Last 3 Encounters:  03/13/18 175 lb (  79.4 kg)  08/15/17 178 lb 4 oz (80.9 kg)  03/14/17 169 lb (76.7 kg)    Physical Exam  Constitutional: He is oriented to person, place, and time. No distress.  HENT:  Right Ear: Tympanic membrane, external ear and ear canal normal.  Left Ear: Tympanic membrane and ear canal normal.  Nose: Mucosal edema and rhinorrhea present. Right sinus exhibits maxillary sinus tenderness and frontal sinus tenderness. Left sinus exhibits maxillary sinus tenderness and frontal sinus tenderness.  Mouth/Throat: Uvula is midline. Posterior oropharyngeal erythema present. No oropharyngeal exudate. Tonsils are 1+ on the right. Tonsils are 1+ on the  left.  Eyes: No scleral icterus.  Neck: Normal range of motion. Neck supple.  Cardiovascular: Normal rate and regular rhythm.  Pulmonary/Chest: Effort normal. He has wheezes. He has no rales.  Musculoskeletal: He exhibits no edema.  Lymphadenopathy:    He has cervical adenopathy.  Neurological: He is alert and oriented to person, place, and time.  Psychiatric: He has a normal mood and affect. His behavior is normal. Thought content normal.  Vitals reviewed.   Lab Results  Component Value Date   WBC 4.9 12/25/2016   HGB 14.9 12/25/2016   HCT 43.2 12/25/2016   PLT 129 (L) 12/25/2016   GLUCOSE 99 08/15/2017   CHOL 223 (H) 10/22/2017   TRIG 144 10/22/2017   HDL 40 (L) 10/22/2017   LDLDIRECT 169.0 04/24/2014   LDLCALC 154 (H) 10/22/2017   ALT 27 10/22/2017   AST 21 10/22/2017   NA 139 08/15/2017   K 4.6 08/15/2017   CL 106 08/15/2017   CREATININE 1.04 08/15/2017   BUN 10 08/15/2017   CO2 24 08/15/2017   TSH 3.153 12/25/2016   PSA 0.45 05/12/2010   INR 1.12 09/13/2015    Assessment & Plan:   [name] [name] was seen today for nasal congestion.  Diagnoses and all orders for this visit:  Acute nasopharyngitis -     guaiFENesin-dextromethorphan (ROBITUSSIN DM) 100-10 MG/5ML syrup; Take 5 mLs by mouth every 4 (four) hours as needed for cough. -     Discontinue: HYDROcodone-homatropine (HYCODAN) 5-1.5 MG/5ML syrup; Take 5 mLs by mouth at bedtime as needed for cough. -     ipratropium (ATROVENT) 0.03 % nasal spray; Place 2 sprays into both nostrils 2 (two) times daily. Do not use for more than 5days. -     sodium chloride (OCEAN) 0.65 % SOLN nasal spray; Place 1 spray into both nostrils as needed for congestion. -     Discontinue: azithromycin (ZITHROMAX Z-PAK) 250 MG tablet; Take 1 tablet (250 mg total) by mouth daily for 5 days. Take 2tabs on first day, then 1tab once a day till complete -     methylPREDNISolone acetate (DEPO-MEDROL) injection 40 mg -     albuterol (PROVENTIL  HFA;VENTOLIN HFA) 108 (90 Base) MCG/ACT inhaler; Inhale 1-2 puffs into the lungs every 6 (six) hours as needed. -     HYDROcodone-homatropine (HYCODAN) 5-1.5 MG/5ML syrup; Take 5 mLs by mouth at bedtime as needed for cough. -     azithromycin (ZITHROMAX Z-PAK) 250 MG tablet; Take 1 tablet (250 mg total) by mouth daily for 5 days. Take 2tabs on first day, then 1tab once a day till complete  Acute bronchitis, unspecified organism -     guaiFENesin-dextromethorphan (ROBITUSSIN DM) 100-10 MG/5ML syrup; Take 5 mLs by mouth every 4 (four) hours as needed for cough. -     Discontinue: HYDROcodone-homatropine (HYCODAN) 5-1.5 MG/5ML syrup; Take 5 mLs by mouth at  bedtime as needed for cough. -     ipratropium (ATROVENT) 0.03 % nasal spray; Place 2 sprays into both nostrils 2 (two) times daily. Do not use for more than 5days. -     sodium chloride (OCEAN) 0.65 % SOLN nasal spray; Place 1 spray into both nostrils as needed for congestion. -     Discontinue: azithromycin (ZITHROMAX Z-PAK) 250 MG tablet; Take 1 tablet (250 mg total) by mouth daily for 5 days. Take 2tabs on first day, then 1tab once a day till complete -     methylPREDNISolone acetate (DEPO-MEDROL) injection 40 mg -     albuterol (PROVENTIL HFA;VENTOLIN HFA) 108 (90 Base) MCG/ACT inhaler; Inhale 1-2 puffs into the lungs every 6 (six) hours as needed. -     HYDROcodone-homatropine (HYCODAN) 5-1.5 MG/5ML syrup; Take 5 mLs by mouth at bedtime as needed for cough. -     azithromycin (ZITHROMAX Z-PAK) 250 MG tablet; Take 1 tablet (250 mg total) by mouth daily for 5 days. Take 2tabs on first day, then 1tab once a day till complete   I have discontinued [employer]'s cyclobenzaprine, fluticasone, oxymetazoline, guaiFENesin, benzonatate, albuterol, and traMADol. I am also having him start on guaiFENesin-dextromethorphan, ipratropium, sodium chloride, and albuterol. Additionally, I am having him maintain his aspirin, pantoprazole, pregabalin,  eplerenone, rosuvastatin, carvedilol, sacubitril-valsartan, ALPRAZolam, HYDROcodone-homatropine, and azithromycin. We administered methylPREDNISolone acetate.  Meds ordered this encounter  Medications  . guaiFENesin-dextromethorphan (ROBITUSSIN DM) 100-10 MG/5ML syrup    Sig: Take 5 mLs by mouth every 4 (four) hours as needed for cough.    Dispense:  118 mL    Refill:  0    Order Specific Question:   Supervising Provider    Answer:   MATTHEWS, CODY [[id number]]  . DISCONTD: HYDROcodone-homatropine (HYCODAN) 5-1.5 MG/5ML syrup    Sig: Take 5 mLs by mouth at bedtime as needed for cough.    Dispense:  120 mL    Refill:  0    Order Specific Question:   Supervising Provider    Answer:   MATTHEWS, CODY [[id number]]  . ipratropium (ATROVENT) 0.03 % nasal spray    Sig: Place 2 sprays into both nostrils 2 (two) times daily. Do not use for more than 5days.    Dispense:  30 mL    Refill:  0    Order Specific Question:   Supervising Provider    Answer:   MATTHEWS, CODY [[id number]]  . sodium chloride (OCEAN) 0.65 % SOLN nasal spray    Sig: Place 1 spray into both nostrils as needed for congestion.    Dispense:  15 mL    Refill:  0    Order Specific Question:   Supervising Provider    Answer:   MATTHEWS, CODY [[id number]]  . DISCONTD: azithromycin (ZITHROMAX Z-PAK) 250 MG tablet    Sig: Take 1 tablet (250 mg total) by mouth daily for 5 days. Take 2tabs on first day, then 1tab once a day till complete    Dispense:  6 tablet    Refill:  0    Order Specific Question:   Supervising Provider    Answer:   MATTHEWS, CODY [[id number]]  . methylPREDNISolone acetate (DEPO-MEDROL) injection 40 mg  . albuterol (PROVENTIL HFA;VENTOLIN HFA) 108 (90 Base) MCG/ACT inhaler    Sig: Inhale 1-2 puffs into the lungs every 6 (six) hours as needed.    Dispense:  1 Inhaler    Refill:  0    Order Specific Question:   Supervising Provider  Answer:   MATTHEWS, CODY [[id number]]  . HYDROcodone-homatropine (HYCODAN) 5-1.5 MG/5ML syrup    Sig: Take 5  mLs by mouth at bedtime as needed for cough.    Dispense:  60 mL    Refill:  0    Order Specific Question:   Supervising Provider    Answer:   MATTHEWS, CODY [[id number]]  . azithromycin (ZITHROMAX Z-PAK) 250 MG tablet    Sig: Take 1 tablet (250 mg total) by mouth daily for 5 days. Take 2tabs on first day, then 1tab once a day till complete    Dispense:  6 tablet    Refill:  0    Order Specific Question:   Supervising Provider    Answer:   MATTHEWS, CODY [[id number]]    Problem List Items Addressed This Visit    None    Visit Diagnoses    Acute nasopharyngitis    -  Primary   Relevant Medications   guaiFENesin-dextromethorphan (ROBITUSSIN DM) 100-10 MG/5ML syrup   ipratropium (ATROVENT) 0.03 % nasal spray   sodium chloride (OCEAN) 0.65 % SOLN nasal spray   methylPREDNISolone acetate (DEPO-MEDROL) injection 40 mg (Completed)   albuterol (PROVENTIL HFA;VENTOLIN HFA) 108 (90 Base) MCG/ACT inhaler   HYDROcodone-homatropine (HYCODAN) 5-1.5 MG/5ML syrup   azithromycin (ZITHROMAX Z-PAK) 250 MG tablet (Start on 03/15/2018)   Acute bronchitis, unspecified organism       Relevant Medications   guaiFENesin-dextromethorphan (ROBITUSSIN DM) 100-10 MG/5ML syrup   ipratropium (ATROVENT) 0.03 % nasal spray   sodium chloride (OCEAN) 0.65 % SOLN nasal spray   methylPREDNISolone acetate (DEPO-MEDROL) injection 40 mg (Completed)   albuterol (PROVENTIL HFA;VENTOLIN HFA) 108 (90 Base) MCG/ACT inhaler   HYDROcodone-homatropine (HYCODAN) 5-1.5 MG/5ML syrup   azithromycin (ZITHROMAX Z-PAK) 250 MG tablet (Start on 03/15/2018)       Follow-up: Return if symptoms worsen or fail to improve.  Wilfred Lacy, NP

## 2018-03-19 ENCOUNTER — Telehealth (INDEPENDENT_AMBULATORY_CARE_PROVIDER_SITE_OTHER): Payer: Self-pay | Admitting: Specialist

## 2018-03-19 NOTE — Telephone Encounter (Signed)
Patient Account information  [facility]  Fax # [phone]   Patient Account number [id number]   Mediation refill  Lyrica 75 mg twice a day

## 2018-03-25 NOTE — Telephone Encounter (Signed)
Ok to refill 

## 2018-03-27 ENCOUNTER — Ambulatory Visit: Payer: Self-pay

## 2018-03-27 NOTE — Telephone Encounter (Signed)
Patient's last prescription filled October 29, 2017 number 180 tablets with 2 refills should have lasted 9 months.

## 2018-03-27 NOTE — Telephone Encounter (Signed)
Pt called stating that he has continued sinus congestion after completing a Zpac and having a steroid injection. He states he was seen for his symptoms 03/13/18. He states that his chest feels better but his nose and sinus continues to be blocked left side is worse that the right. His discharge is green yellow. He has a itchy throat and his ears are itchy. Appointment scheduled per protocol.  Care advice read to patient.  Pt verbalized understanding of all instructions.  Reason for Disposition . [1] Sinus pain (not just congestion) AND [2] fever  Answer Assessment - Initial Assessment Questions 1. LOCATION: "Where does it hurt?"      Pressure left side of nose 2. ONSET: "When did the sinus pain start?"  (e.g., hours, days)      03/14/18 3. SEVERITY: "How bad is the pain?"   (Scale 1-10; mild, moderate or severe)   - MILD (1-3): doesn't interfere with normal activities    - MODERATE (4-7): interferes with normal activities (e.g., work or school) or awakens from sleep   - SEVERE (8-10): excruciating pain and patient unable to do any normal activities        Severe pressure 4. RECURRENT SYMPTOM: "Have you ever had sinus problems before?" If so, ask: "When was the last time?" and "What happened that time?"      Ongoing sinus issue 5. NASAL CONGESTION: "Is the nose blocked?" If so, ask, "Can you open it or must you breathe through the mouth?"     Breath through mouth left side is solid 6. NASAL DISCHARGE: "Do you have discharge from your nose?" If so ask, "What color?"     Greenish yellow 7. FEVER: "Do you have a fever?" If so, ask: "What is it, how was it measured, and when did it start?"      no 8. OTHER SYMPTOMS: "Do you have any other symptoms?" (e.g., sore throat, cough, earache, difficulty breathing)     Throat is itchy ears are itchy 9. PREGNANCY: "Is there any chance you are pregnant?" "When was your last menstrual period?"     N/A  Protocols used: SINUS PAIN OR CONGESTION-A-AH

## 2018-03-28 ENCOUNTER — Encounter: Payer: Self-pay | Admitting: Nurse Practitioner

## 2018-03-28 ENCOUNTER — Telehealth: Payer: Self-pay | Admitting: Nurse Practitioner

## 2018-03-28 ENCOUNTER — Ambulatory Visit (INDEPENDENT_AMBULATORY_CARE_PROVIDER_SITE_OTHER): Payer: Medicare HMO | Admitting: Nurse Practitioner

## 2018-03-28 VITALS — BP 112/80 | HR 64 | Temp 98.0°F | Ht 67.5 in | Wt 173.0 lb

## 2018-03-28 DIAGNOSIS — J0100 Acute maxillary sinusitis, unspecified: Secondary | ICD-10-CM

## 2018-03-28 MED ORDER — AMOXICILLIN-POT CLAVULANATE 875-125 MG PO TABS: 1.0000 | ORAL_TABLET | Freq: Two times a day (BID) | ORAL | 0 refills | Status: DC

## 2018-03-28 MED ORDER — METHYLPREDNISOLONE ACETATE 40 MG/ML IJ SUSP
40.0000 mg | Freq: Once | INTRAMUSCULAR | Status: AC
  Administered 2018-03-28: 40 mg via INTRAMUSCULAR

## 2018-03-28 MED ORDER — CETIRIZINE HCL 5 MG PO TABS: 5.0000 mg | ORAL_TABLET | Freq: Every day | ORAL | Status: DC

## 2018-03-28 MED ORDER — PREDNISONE 20 MG PO TABS: ORAL_TABLET | ORAL | 0 refills | Status: AC

## 2018-03-28 NOTE — Telephone Encounter (Signed)
Spoke with pharmacy, gave them verbal for 9 tablet for prednisone.

## 2018-03-28 NOTE — Telephone Encounter (Signed)
See note

## 2018-03-28 NOTE — Telephone Encounter (Signed)
Copied from [facility] [id number]. Topic: Quick Communication - See Telephone Encounter >> Mar 28, 2018  2:18 PM Vernona Rieger wrote: CRM for notification. See Telephone encounter for: 03/28/18.  Clarise Cruz with [facility] called and stated that they just received a script for predniSONE (DELTASONE) 20 MG tablet. She stated that the quantity does not match the directions and that he would need 9 pills instead of 8. Please Advise.

## 2018-03-28 NOTE — Telephone Encounter (Signed)
Noted  

## 2018-03-28 NOTE — Patient Instructions (Addendum)
Take mucinex 600mg  every 12hrs x 5days Continue saline sinus rinse once a day.  Start augmentin, zrytec and oral prednisone tomorrow.  Consider CT sinus if no improvement.

## 2018-03-28 NOTE — Progress Notes (Signed)
Subjective:  Patient ID: [name] [name], male    DOB: [birth year]  Age: 67 y.o. MRN: [id number]  CC: Follow-up (somewhate better from last ov/alot nasal congestions,)   Sinusitis  This is a recurrent problem. The current episode started 1 to 4 weeks ago. The problem has been waxing and waning since onset. There has been no fever. Associated symptoms include congestion, ear pain, headaches and sinus pressure. Pertinent negatives include no chills, coughing, hoarse voice, shortness of breath, sneezing, sore throat or swollen glands. Past treatments include oral decongestants, antibiotics, saline sprays and spray decongestants. The treatment provided moderate relief.   Reviewed past Medical, Social and Family history today.  Outpatient Medications Prior to Visit  Medication Sig Dispense Refill  . albuterol (PROVENTIL HFA;VENTOLIN HFA) 108 (90 Base) MCG/ACT inhaler Inhale 1-2 puffs into the lungs every 6 (six) hours as needed. 1 Inhaler 0  . ALPRAZolam (XANAX) 0.5 MG tablet     . aspirin 81 MG tablet Take 1 tablet (81 mg total) by mouth daily.    . carvedilol (COREG) 12.5 MG tablet Take 1 tablet (12.5 mg total) by mouth 2 (two) times daily. 180 tablet 0  . eplerenone (INSPRA) 25 MG tablet Take 1 tablet (25 mg total) by mouth daily. 30 tablet 3  . ipratropium (ATROVENT) 0.03 % nasal spray Place 2 sprays into both nostrils 2 (two) times daily. Do not use for more than 5days. 30 mL 0  . pantoprazole (PROTONIX) 40 MG tablet Take 1 tablet (40 mg total) by mouth daily. 90 tablet 3  . pregabalin (LYRICA) 75 MG capsule Take 1 capsule (75 mg total) by mouth 2 (two) times daily. 180 capsule 2  . rosuvastatin (CRESTOR) 40 MG tablet Take 1 tablet (40 mg total) by mouth daily. 30 tablet 11  . sacubitril-valsartan (ENTRESTO) 24-26 MG Take 1 tablet by mouth 2 (two) times daily. 60 tablet 11  . guaiFENesin-dextromethorphan (ROBITUSSIN DM) 100-10 MG/5ML syrup Take 5 mLs by mouth every 4 (four) hours as needed  for cough. (Patient not taking: Reported on 03/28/2018) 118 mL 0  . HYDROcodone-homatropine (HYCODAN) 5-1.5 MG/5ML syrup Take 5 mLs by mouth at bedtime as needed for cough. (Patient not taking: Reported on 03/28/2018) 60 mL 0  . sodium chloride (OCEAN) 0.65 % SOLN nasal spray Place 1 spray into both nostrils as needed for congestion. (Patient not taking: Reported on 03/28/2018) 15 mL 0   No facility-administered medications prior to visit.     ROS See HPI  Objective:  BP 112/80   Pulse 64   Temp 98 F (36.7 C) (Oral)   Ht 5' 7.5" (1.715 m)   Wt 173 lb (78.5 kg)   SpO2 97%   BMI 26.70 kg/m   BP Readings from Last 3 Encounters:  03/28/18 112/80  03/13/18 120/76  08/15/17 (!) 144/83    Wt Readings from Last 3 Encounters:  03/28/18 173 lb (78.5 kg)  03/13/18 175 lb (79.4 kg)  08/15/17 178 lb 4 oz (80.9 kg)    Physical Exam Vitals signs reviewed.  Constitutional:      Appearance: Normal appearance.  HENT:     Nose: Nasal tenderness, mucosal edema, congestion and rhinorrhea present.     Right Nostril: No foreign body, epistaxis or occlusion.     Left Nostril: Occlusion present. No foreign body or epistaxis.     Right Turbinates: Swollen.     Left Turbinates: Swollen.     Right Sinus: Maxillary sinus tenderness present.  Left Sinus: Maxillary sinus tenderness present.  Neck:     Musculoskeletal: Normal range of motion and neck supple.  Cardiovascular:     Rate and Rhythm: Normal rate and regular rhythm.  Musculoskeletal:     Right lower leg: No edema.     Left lower leg: No edema.  Neurological:     Mental Status: He is alert.     Lab Results  Component Value Date   WBC 4.9 12/25/2016   HGB 14.9 12/25/2016   HCT 43.2 12/25/2016   PLT 129 (L) 12/25/2016   GLUCOSE 99 08/15/2017   CHOL 223 (H) 10/22/2017   TRIG 144 10/22/2017   HDL 40 (L) 10/22/2017   LDLDIRECT 169.0 04/24/2014   LDLCALC 154 (H) 10/22/2017   ALT 27 10/22/2017   AST 21 10/22/2017   NA  139 08/15/2017   K 4.6 08/15/2017   CL 106 08/15/2017   CREATININE 1.04 08/15/2017   BUN 10 08/15/2017   CO2 24 08/15/2017   TSH 3.153 12/25/2016   PSA 0.45 05/12/2010   INR 1.12 09/13/2015    Assessment & Plan:   [name] [name] was seen today for follow-up.  Diagnoses and all orders for this visit:  Acute non-recurrent maxillary sinusitis -     cetirizine (ZYRTEC) 5 MG tablet; Take 1 tablet (5 mg total) by mouth daily. -     amoxicillin-clavulanate (AUGMENTIN) 875-125 MG tablet; Take 1 tablet by mouth 2 (two) times daily. -     predniSONE (DELTASONE) 20 MG tablet; Take 3 tablets (60 mg total) by mouth daily with breakfast for 1 day, THEN 2 tablets (40 mg total) daily with breakfast for 2 days, THEN 1 tablet (20 mg total) daily with breakfast for 2 days. -     methylPREDNISolone acetate (DEPO-MEDROL) injection 40 mg   I am having [name] [name] start on cetirizine, amoxicillin-clavulanate, and predniSONE. I am also having him maintain his aspirin, pantoprazole, pregabalin, eplerenone, rosuvastatin, carvedilol, sacubitril-valsartan, ALPRAZolam, guaiFENesin-dextromethorphan, ipratropium, sodium chloride, albuterol, and HYDROcodone-homatropine. We administered methylPREDNISolone acetate.  Meds ordered this encounter  Medications  . cetirizine (ZYRTEC) 5 MG tablet    Sig: Take 1 tablet (5 mg total) by mouth daily.    Order Specific Question:   Supervising Provider    Answer:   [name] [name] [[id number]]  . amoxicillin-clavulanate (AUGMENTIN) 875-125 MG tablet    Sig: Take 1 tablet by mouth 2 (two) times daily.    Dispense:  14 tablet    Refill:  0    Order Specific Question:   Supervising Provider    Answer:   [name] [name] [[id number]]  . predniSONE (DELTASONE) 20 MG tablet    Sig: Take 3 tablets (60 mg total) by mouth daily with breakfast for 1 day, THEN 2 tablets (40 mg total) daily with breakfast for 2 days, THEN 1 tablet (20 mg total) daily with breakfast for 2 days.    Dispense:  8 tablet     Refill:  0    Order Specific Question:   Supervising Provider    Answer:   [name] [name] [[id number]]  . methylPREDNISolone acetate (DEPO-MEDROL) injection 40 mg    Problem List Items Addressed This Visit    None    Visit Diagnoses    Acute non-recurrent maxillary sinusitis    -  Primary   Relevant Medications   cetirizine (ZYRTEC) 5 MG tablet   amoxicillin-clavulanate (AUGMENTIN) 875-125 MG tablet   predniSONE (DELTASONE) 20 MG tablet (Start on 03/29/2018)  methylPREDNISolone acetate (DEPO-MEDROL) injection 40 mg (Completed)       Follow-up: No follow-ups on file.  Wilfred Lacy, NP

## 2018-03-29 ENCOUNTER — Telehealth (HOSPITAL_COMMUNITY): Payer: Self-pay | Admitting: Licensed Clinical Social Worker

## 2018-03-29 ENCOUNTER — Other Ambulatory Visit (HOSPITAL_COMMUNITY): Payer: Self-pay

## 2018-03-29 ENCOUNTER — Ambulatory Visit (HOSPITAL_COMMUNITY)
Admission: RE | Admit: 2018-03-29 | Discharge: 2018-03-29 | Disposition: A | Discharge status: Home / Self Care | Payer: Medicare HMO | Source: Ambulatory Visit | Attending: Internal Medicine | Admitting: Internal Medicine

## 2018-03-29 DIAGNOSIS — E785 Hyperlipidemia, unspecified: Secondary | ICD-10-CM | POA: Diagnosis not present

## 2018-03-29 DIAGNOSIS — E781 Pure hyperglyceridemia: Secondary | ICD-10-CM

## 2018-03-29 LAB — HEPATIC FUNCTION PANEL
ALK PHOS: 54 U/L (ref 38–126)
ALT: 31 U/L (ref 0–44)
AST: 24 U/L (ref 15–41)
Albumin: 3.9 g/dL (ref 3.5–5.0)
Bilirubin, Direct: 0.2 mg/dL (ref 0.0–0.2)
Indirect Bilirubin: 0.9 mg/dL (ref 0.3–0.9)
TOTAL PROTEIN: 7.2 g/dL (ref 6.5–8.1)
Total Bilirubin: 1.1 mg/dL (ref 0.3–1.2)

## 2018-03-29 LAB — LIPID PANEL
CHOL/HDL RATIO: 6.5 ratio
Cholesterol: 239 mg/dL — ABNORMAL HIGH (ref 0–200)
HDL: 37 mg/dL — ABNORMAL LOW (ref >40)
LDL Cholesterol: 149 mg/dL — ABNORMAL HIGH (ref 0–99)
Triglycerides: 264 mg/dL — ABNORMAL HIGH (ref <150)
VLDL: 53 mg/dL — ABNORMAL HIGH (ref 0–40)

## 2018-03-29 MED ORDER — SACUBITRIL-VALSARTAN 24-26 MG PO TABS: 1.0000 | ORAL_TABLET | Freq: Two times a day (BID) | ORAL | 11 refills | Status: DC

## 2018-03-29 NOTE — Telephone Encounter (Signed)
CSW consulted to assist with getting pt INSPRA reordered.  CSW called assistance program and had them place a new order to be delivered to the clinic.  CSW will continue to follow and assist as needed  Jorge Ny, LCSW Clinical Social Worker [facility] [phone]

## 2018-03-29 NOTE — Telephone Encounter (Signed)
[employer] re-enrollment form faxed in- fax confirmation received.  CSW will continue to follow  Jorge Ny, LCSW Clinical Social Worker [facility] [phone]

## 2018-04-01 ENCOUNTER — Telehealth (HOSPITAL_COMMUNITY): Payer: Self-pay | Admitting: *Deleted

## 2018-04-01 ENCOUNTER — Other Ambulatory Visit (INDEPENDENT_AMBULATORY_CARE_PROVIDER_SITE_OTHER): Payer: Self-pay | Admitting: Specialist

## 2018-04-01 MED ORDER — ROSUVASTATIN CALCIUM 40 MG PO TABS: 40.0000 mg | ORAL_TABLET | Freq: Every day | ORAL | 11 refills | Status: DC

## 2018-04-01 MED ORDER — ROSUVASTATIN CALCIUM 10 MG PO TABS: 40.0000 mg | ORAL_TABLET | Freq: Every day | ORAL | 11 refills | Status: DC

## 2018-04-01 MED ORDER — TRAMADOL HCL 50 MG PO TABS: 50.0000 mg | ORAL_TABLET | Freq: Four times a day (QID) | ORAL | 0 refills | Status: DC | PRN

## 2018-04-01 MED ORDER — PREGABALIN 75 MG PO CAPS: 75.0000 mg | ORAL_CAPSULE | Freq: Two times a day (BID) | ORAL | 2 refills | Status: DC

## 2018-04-01 NOTE — Telephone Encounter (Signed)
I called and advised both rxs have been sent in

## 2018-04-01 NOTE — Telephone Encounter (Signed)
[address]

## 2018-04-01 NOTE — Addendum Note (Signed)
Addended by: Shatoria Stooksbury, Sharlot Gowda on: 04/01/2018 12:50 PM   Modules accepted: Orders

## 2018-04-01 NOTE — Telephone Encounter (Signed)
-----   Message from Larey Dresser, MD sent at 03/30/2018  6:55 AM EST ----- LDL too high, would restart on statin.  Use Crestor 10 mg daily with lipids/LFTs in 2 months.

## 2018-04-01 NOTE — Telephone Encounter (Signed)
Patient called requesting a call from someone to explain new procedures for refill and script. Lyrica  PLEASE call him to discuss.  Wants Tramadol refill as well. [phone]

## 2018-04-16 ENCOUNTER — Telehealth (HOSPITAL_COMMUNITY): Payer: Self-pay | Admitting: Licensed Clinical Social Worker

## 2018-04-16 NOTE — Telephone Encounter (Signed)
CSW received notification from [employer] patient assistance that there was an issue with provider information for patient.  CSW also received call pt inquiring about if his Inspra had come in yet.  CSW spoke with [facility] representative who was able to fix provider information concerns in clinic.  [employer] reports that due to an issue they weren't able to complete the order when placed 2 weeks ago.  CSW had Inspra ordered- they will send to clinic order ID#: [id number]- anticipate it arriving in 7-10 business days.  CSW called pt and left message updating him.  Pt also inquired about Novartis application- currently this application is pending- per [employer] everyone should be notified by end of February- encouraged pt to call the clinic if he runs out of Entresto prior to getting approved through [employer].  Jorge Ny, LCSW Clinical Social Worker [facility] [phone]

## 2018-04-26 ENCOUNTER — Telehealth (HOSPITAL_COMMUNITY): Payer: Self-pay | Admitting: Licensed Clinical Social Worker

## 2018-04-26 ENCOUNTER — Telehealth (HOSPITAL_COMMUNITY): Payer: Self-pay

## 2018-04-26 NOTE — Telephone Encounter (Signed)
Spoke to patient to make him aware his inspira 25mg  tabs are here and waiting for him to pick them up. Told pt that they would be at the front desk. Pt stated he would come by Monday to get them.

## 2018-04-26 NOTE — Telephone Encounter (Signed)
Pt called to inquire about status of his Inspra order and of Novartis application.  Pt Novartis application is still pending- CSW got Entresto sample for pt and left at front desk for him to pick up since he would be out of [employer].  Inspra order has also not come in but it still has not been the 7-10 business days since the order was placed so will hopefully come in by next week- pt will be called once it arrives  CSW will continue to follow through clinic and assist as needed  Jorge Ny, [place] Worker [facility] [phone]

## 2018-05-08 ENCOUNTER — Other Ambulatory Visit (INDEPENDENT_AMBULATORY_CARE_PROVIDER_SITE_OTHER): Payer: Self-pay | Admitting: Specialist

## 2018-05-08 ENCOUNTER — Telehealth (HOSPITAL_COMMUNITY): Payer: Self-pay | Admitting: Licensed Clinical Social Worker

## 2018-05-08 NOTE — Telephone Encounter (Signed)
CSW called pt to discuss Entresto assistance- has been waiting on Novartis response but pt likely not a candidate since has prescription coverage.  CSW applied for [employer] with pt permission- pt granted $2,500 for copay assistance:  Pharmacy Card Information Pharmacy Card Id [id number] Group [id number] PCN [id number] BIN [id number]  Information called into pt pharmacy and pt updated  CSW will continue to follow and assist as needed  Jorge Ny, LCSW Clinical Social Worker [facility] [phone]

## 2018-05-08 NOTE — Telephone Encounter (Signed)
Tramadol refill request 

## 2018-05-20 ENCOUNTER — Telehealth (HOSPITAL_COMMUNITY): Payer: Self-pay | Admitting: *Deleted

## 2018-05-20 NOTE — Telephone Encounter (Signed)
Surgical clearance faxed to [facility] eye for cataract extraction.

## 2018-06-11 ENCOUNTER — Other Ambulatory Visit (INDEPENDENT_AMBULATORY_CARE_PROVIDER_SITE_OTHER): Payer: Self-pay | Admitting: Specialist

## 2018-06-11 NOTE — Telephone Encounter (Signed)
Tramadol refill request 

## 2018-06-13 ENCOUNTER — Telehealth (INDEPENDENT_AMBULATORY_CARE_PROVIDER_SITE_OTHER): Payer: Self-pay | Admitting: Specialist

## 2018-06-13 ENCOUNTER — Telehealth (HOSPITAL_COMMUNITY): Payer: Self-pay | Admitting: Cardiology

## 2018-06-13 NOTE — Telephone Encounter (Signed)
Patient called left voicemail message stating [facility] faxed in Rx for (Tramadol) for him several days ago. Patient asked for a call back concerning Rx. The number to contact patient is [phone]

## 2018-06-13 NOTE — Telephone Encounter (Signed)
Per Tammy Sours, CSW, pt needs to schedule f/u appt with DM (was due 02/2018).  No VM on mobile number and unable to leave message on home number listed.  Will try to call patient back.

## 2018-06-13 NOTE — Telephone Encounter (Signed)
This is still pending in Dr. Otho Ket inbox

## 2018-06-18 NOTE — Telephone Encounter (Signed)
Called patient to schedule f/u with DM.  Still no answer and unable to leave message on home number

## 2018-06-26 ENCOUNTER — Telehealth (HOSPITAL_COMMUNITY): Payer: Self-pay | Admitting: Licensed Clinical Social Worker

## 2018-06-26 ENCOUNTER — Other Ambulatory Visit (HOSPITAL_COMMUNITY): Payer: Self-pay | Admitting: Cardiology

## 2018-06-26 MED ORDER — EPLERENONE 25 MG PO TABS: 25.0000 mg | ORAL_TABLET | Freq: Every day | ORAL | 3 refills | Status: DC

## 2018-06-26 NOTE — Telephone Encounter (Signed)
CSW called [employer] to request another shipment of pt Inspra- had to send in new prescription for them to process application- awaiting MD signature then will sent to [place] at fax #: [phone]  CSW will continue to follow and assist as needed  Jorge Ny, [place] Worker [facility] [phone]

## 2018-06-26 NOTE — Addendum Note (Signed)
Addended by: Kerry Dory on: 06/26/2018 04:17 PM   Modules accepted: Orders

## 2018-07-03 ENCOUNTER — Telehealth (INDEPENDENT_AMBULATORY_CARE_PROVIDER_SITE_OTHER): Payer: Self-pay | Admitting: Specialist

## 2018-07-03 NOTE — Telephone Encounter (Signed)
Patient called to request refill. Stated he was denied and not sure why.  Please call patient to advise,  [phone]

## 2018-07-04 ENCOUNTER — Telehealth (INDEPENDENT_AMBULATORY_CARE_PROVIDER_SITE_OTHER): Payer: Self-pay | Admitting: Specialist

## 2018-07-04 NOTE — Telephone Encounter (Signed)
See other message

## 2018-07-04 NOTE — Telephone Encounter (Signed)
I called and advised patient that we can not refill him meds since we have not seen him since 10/2015. Advised that he needs an office visit to get it.  He states that he doesn't have the money for the copays since he on disability.  I advised that he maybe able to call contact his PCP and get refill from them.  He states that he doesn't take them very often and actually the Lyrica helps him more than the tramadol.  He states that his renewal for the lyrica is coming up and states that he will bring the information by to [place].

## 2018-07-04 NOTE — Telephone Encounter (Signed)
Patient called and stated he received a call from here. States he needed to speak to [place].  Please call patient back.

## 2018-07-10 ENCOUNTER — Telehealth (HOSPITAL_COMMUNITY): Payer: Self-pay

## 2018-07-10 NOTE — Telephone Encounter (Signed)
Called pt to let him know INSPRA 25mg  LOT: [id number] EXP: Jun 07, 2020 was in the office for him, stated I would leave at front desk and he stated would pick up sometime this week

## 2018-08-08 ENCOUNTER — Other Ambulatory Visit (HOSPITAL_COMMUNITY): Payer: Self-pay | Admitting: *Deleted

## 2018-08-08 MED ORDER — EPLERENONE 25 MG PO TABS: 25.0000 mg | ORAL_TABLET | Freq: Every day | ORAL | 3 refills | Status: DC

## 2018-08-08 NOTE — Telephone Encounter (Signed)
inspra rx printed and signed by Dr Aundra Dubin and faxed along w/application to [facility] for pt assist program

## 2018-08-09 ENCOUNTER — Other Ambulatory Visit (HOSPITAL_COMMUNITY): Payer: Self-pay | Admitting: *Deleted

## 2018-08-09 ENCOUNTER — Other Ambulatory Visit (HOSPITAL_COMMUNITY): Payer: Self-pay | Admitting: Cardiology

## 2018-08-11 ENCOUNTER — Other Ambulatory Visit (HOSPITAL_COMMUNITY): Payer: Self-pay | Admitting: Internal Medicine

## 2018-08-13 ENCOUNTER — Telehealth (HOSPITAL_COMMUNITY): Payer: Self-pay

## 2018-08-13 NOTE — Telephone Encounter (Signed)
Received letter from [employer] regarding patient assistance form for Lyrica being incomplete. Advised patient to contact his prescribing physician to complete these forms. Pt states he spoke with [facility] today and they will send his next months refill for lyrica and he will contact his doctors office to continue refills through [employer].

## 2018-08-18 ENCOUNTER — Other Ambulatory Visit: Payer: Self-pay | Admitting: Physician Assistant

## 2018-08-20 ENCOUNTER — Other Ambulatory Visit: Payer: Self-pay | Admitting: Physician Assistant

## 2018-08-20 ENCOUNTER — Other Ambulatory Visit: Payer: Self-pay | Admitting: Radiology

## 2018-08-21 ENCOUNTER — Other Ambulatory Visit: Payer: Self-pay | Admitting: Gastroenterology

## 2018-08-21 ENCOUNTER — Telehealth: Payer: Self-pay | Admitting: Gastroenterology

## 2018-08-21 ENCOUNTER — Other Ambulatory Visit: Payer: Self-pay | Admitting: Physician Assistant

## 2018-08-21 ENCOUNTER — Other Ambulatory Visit: Payer: Self-pay | Admitting: Radiology

## 2018-08-21 MED ORDER — PREGABALIN 75 MG PO CAPS: 75.0000 mg | ORAL_CAPSULE | Freq: Two times a day (BID) | ORAL | 3 refills | Status: DC

## 2018-08-21 MED ORDER — PANTOPRAZOLE SODIUM 40 MG PO TBEC: 40.0000 mg | DELAYED_RELEASE_TABLET | Freq: Every day | ORAL | 0 refills | Status: DC

## 2018-08-21 NOTE — Telephone Encounter (Signed)
Filled prescription to get patient to get through until next appointment. Pharmacy will contact patient.

## 2018-08-28 ENCOUNTER — Ambulatory Visit (INDEPENDENT_AMBULATORY_CARE_PROVIDER_SITE_OTHER): Payer: Medicare HMO | Admitting: Gastroenterology

## 2018-08-28 ENCOUNTER — Ambulatory Visit: Payer: Medicare HMO | Admitting: Gastroenterology

## 2018-08-28 ENCOUNTER — Other Ambulatory Visit: Payer: Self-pay

## 2018-08-28 ENCOUNTER — Encounter: Payer: Self-pay | Admitting: Gastroenterology

## 2018-08-28 VITALS — Ht 67.5 in | Wt 175.0 lb

## 2018-08-28 DIAGNOSIS — K219 Gastro-esophageal reflux disease without esophagitis: Secondary | ICD-10-CM | POA: Diagnosis not present

## 2018-08-28 MED ORDER — PANTOPRAZOLE SODIUM 40 MG PO TBEC: 40.0000 mg | DELAYED_RELEASE_TABLET | Freq: Every day | ORAL | 3 refills | Status: DC

## 2018-08-28 NOTE — Progress Notes (Signed)
Review of pertinent gastrointestinal problems: 1. Routine risk for colon cancer: colonoscopy 04/2010 Dr. Ardis Hughs done for minor rectal bleeding; small hemorrhoids, left sided diverticulosis, no polyps.  Recall colon cancer screening recommended at 10 years 2. H pylori + gastritis; EGD Ardis Hughs 04/2010 done for dyspepsia; treated with abx 3. Erosive esophagitis, EGD 2012. 4. Small to medium sized HH.   This service was provided via virtual visit.   Only audio was used.  The patient was located at home.  I was located in my office.  The patient did consent to this virtual visit and is aware of possible charges through their insurance for this visit.  The patient is an established patient.  My certified medical assistant, Grace Bushy, contributed to this visit by contacting the patient by phone 1 or 2 business days prior to the appointment and also followed up on the recommendations I made after the visit.  Time spent on virtual visit: 23 minutes   HPI: This is a very pleasant 68 year old man   I have not seen him in several years however he was here in our office a little over 2 years ago January 2018 and saw Amy.  At that time he was having complaints of "burning subxiphoid discomfort which was very similar to his previous esophagitis symptoms".  He had not been taking any of his proton pump inhibitor for the past couple of years at least.  Amy put him back on his Protonix 40 mg every morning.  She reiterated antireflux regimen, decreased his aspirin to 81 mg once daily.  She also recheck stool H. pylori antigen since he had been positive in the past.  I do not think he ever turned in that stool H. pylori test.   He does well as long as he takes protonix once a day in AM first thing in the morning. Sometimes will skip a couple days, usually takes it 5 days a week.  As long as he takes it he feels well and has no pyrosis.  No dysphagia. Overall his weight has been stable. No overt GI  bleeding.  Chief complaint is chronic GERD  ROS: complete GI ROS as described in HPI, all other review negative.  Constitutional:  No unintentional weight loss   Past Medical History:  Diagnosis Date  . Allergic rhinitis   . Anxiety   . Asthma    Probable mild intermittent asthma  . CKD (chronic kidney disease)   . Diverticulosis    a. Colonoscopy (1/12) with mild diverticulosis and hemorrhoids  . Elevated LFTs    HCV negative, possibly due to ETOH  . GERD (gastroesophageal reflux disease)    EGD 1/12 with erosive esophagitis and gastritis, biopsy postiive for H pylori (being  treated)  . Gynecomastia    with spironolactone (ok on eplerenone)  . History of back surgery   . HTN (hypertension)   . Hypertriglyceridemia   . Knee osteoarthritis    s/p arhtroscopy 2010  . Nonischemic cardiomyopathy ([place])    [facility] 06/2002 with normal cors; Myoview 11/08 neg for ischemia. Echo 4/08: EF of 35-40%.  Cause of NICM unknown.  Never a heavy drinker, used cocaine or amphetamines. HIV, SPEP, and ferritin/Fe studies were all negative in 12/09. Myoview (5/11): EF 42% with apical thinning but no evidence for ischemia or infarction. Echo (3/12): EF 45-50%, no significant valvular abnormalities.   . Rotator cuff tendonitis    with hx of bilateral shoulder surgery  . Sleep apnea    doesnot  use cpap    Past Surgical History:  Procedure Laterality Date  . ELECTROCARDIOGRAM  09/20/06  . GANGLION CYST EXCISION Right 09/20/2015   Procedure: REMOVAL GANGLION OF WRIST;  Surgeon: Jessy Oto, MD;  Location: [facility];  Service: Orthopedics;  Laterality: Right;  . KNEE ARTHROSCOPY Left    x 2, then partial replacement  . LUMBAR LAMINECTOMY Right 09/20/2015   Procedure: FAR LATERAL APPROACH TO EXCISE HERNIATED NUCLEUS PULPOSUS RIGHT Lumbar 1- Lumbar 2 AND RIGHT Lumbar 2-Lumbar 3, EXCISION OF GANGLION CYST RIGHT VOLAR RADIAL WRIST;  Surgeon: Jessy Oto, MD;  Location: [facility];  Service: Orthopedics;   Laterality: Right;  . MEDIAL PARTIAL KNEE REPLACEMENT Left   . PTCA    . ROTATOR CUFF REPAIR     2R; 3 L  . stress cardiolite  12/15/05  . TRANSTHORACIC ECHOCARDIOGRAM  07/13/06    Current Outpatient Medications  Medication Sig Dispense Refill  . albuterol (PROVENTIL HFA;VENTOLIN HFA) 108 (90 Base) MCG/ACT inhaler Inhale 1-2 puffs into the lungs every 6 (six) hours as needed. 1 Inhaler 0  . ALPRAZolam (XANAX) 0.5 MG tablet     . aspirin 81 MG tablet Take 1 tablet (81 mg total) by mouth daily.    . carvedilol (COREG) 12.5 MG tablet Take 1 tablet by mouth twice daily 180 tablet 0  . ENTRESTO 24-26 MG Take 1 tablet by mouth twice daily 180 tablet 0  . eplerenone (INSPRA) 25 MG tablet Take 1 tablet (25 mg total) by mouth daily. 90 tablet 3  . pantoprazole (PROTONIX) 40 MG tablet Take 1 tablet (40 mg total) by mouth daily. 30 tablet 0  . pregabalin (LYRICA) 75 MG capsule Take 1 capsule (75 mg total) by mouth 2 (two) times daily. 180 capsule 3  . rosuvastatin (CRESTOR) 40 MG tablet Take 1 tablet (40 mg total) by mouth daily. 30 tablet 11   No current facility-administered medications for this visit.     Allergies as of 08/28/2018  . (No Known Allergies)    Family History  Problem Relation Age of Onset  . Stroke Mother   . Prostate cancer Father   . Cardiomyopathy Brother        nonischemic, has icd  . Cirrhosis Brother        alcoholic   . Pancreatitis Sister   . Colon cancer Neg Hx   . Esophageal cancer Neg Hx   . Pancreatic cancer Neg Hx   . Liver disease Neg Hx     Social History   Socioeconomic History  . Marital status: Divorced    Spouse name: Not on file  . Number of children: 2  . Years of education: Not on file  . Highest education level: Not on file  Occupational History  . Occupation: Disability  Social Needs  . Financial resource strain: Not on file  . Food insecurity:    Worry: Not on file    Inability: Not on file  . Transportation needs:    Medical: Not  on file    Non-medical: Not on file  Tobacco Use  . Smoking status: Never Smoker  . Smokeless tobacco: Never Used  . Tobacco comment: denies   Substance and Sexual Activity  . Alcohol use: Yes    Alcohol/week: 6.0 standard drinks    Types: 6 Shots of liquor per week    Comment: weekends  . Drug use: No    Comment: Smoked marijuana on occasion in distant past.   . Sexual  activity: Not Currently  Lifestyle  . Physical activity:    Days per week: Not on file    Minutes per session: Not on file  . Stress: Not on file  Relationships  . Social connections:    Talks on phone: Not on file    Gets together: Not on file    Attends religious service: Not on file    Active member of club or organization: Not on file    Attends meetings of clubs or organizations: Not on file    Relationship status: Not on file  . Intimate partner violence:    Fear of current or ex partner: Not on file    Emotionally abused: Not on file    Physically abused: Not on file    Forced sexual activity: Not on file  Other Topics Concern  . Not on file  Social History Narrative  . Not on file     Physical Exam: Unable to perform because this was a "telemed visit" due to current Covid-19 pandemic  Assessment and plan: 68 y.o. male with chronic GERD without alarm symptoms  He does very well as long as he takes his proton pump inhibitor for 5 times per week.  He has no alarm symptoms.  I am happy to refill this medicine for him now and in a year from now.  If he still needs prescription antiacid medicines in 2 years he knows I would like to see him in the office to discuss.  Please see the "Patient Instructions" section for addition details about the plan.  Owens Loffler, MD [facility] 08/28/2018, 2:17 PM

## 2018-08-28 NOTE — Patient Instructions (Addendum)
  We will call in refills of pantoprazole, 40 mg pills, 90-day supply with 3 refills.   He knows I will refill it again in 1 year however if he still requires prescription antiacid medicines in 2 years I would like to see him in the office again to discuss.  Thank you for entrusting me with your care and choosing [facility].  Dr Ardis Hughs

## 2018-09-18 ENCOUNTER — Other Ambulatory Visit (HOSPITAL_COMMUNITY): Payer: Self-pay | Admitting: Internal Medicine

## 2018-10-01 ENCOUNTER — Telehealth: Payer: Self-pay | Admitting: Specialist

## 2018-10-01 NOTE — Telephone Encounter (Signed)
Patient called asked if [name] [name] could call him concerning his application for Lyrica. Patient said he was told the date was not on the application which should be 08/09/2018. Patient said the proof of how much he make a month was not on the application as well. The number to contact patient is [phone]

## 2018-10-01 NOTE — Telephone Encounter (Signed)
I called and advised patient that I did not rec'e the info unitl after 08/09/18 and that Dr. Louanne Skye signed off on the info on 08/22/18.  And NO where on the application does it ask for a quantity for each month.  He states that they are needing his amount of income, I advised that I would refax ALL the info back to [place] which has been done

## 2018-10-07 ENCOUNTER — Telehealth: Payer: Self-pay

## 2018-10-07 ENCOUNTER — Telehealth: Payer: Self-pay | Admitting: *Deleted

## 2018-10-07 ENCOUNTER — Other Ambulatory Visit: Payer: Self-pay

## 2018-10-07 ENCOUNTER — Ambulatory Visit (INDEPENDENT_AMBULATORY_CARE_PROVIDER_SITE_OTHER): Payer: Medicare HMO | Admitting: Nurse Practitioner

## 2018-10-07 ENCOUNTER — Other Ambulatory Visit: Payer: Medicare HMO

## 2018-10-07 VITALS — Temp 97.1°F | Ht 67.5 in | Wt 175.0 lb

## 2018-10-07 DIAGNOSIS — Z20822 Contact with and (suspected) exposure to covid-19: Secondary | ICD-10-CM

## 2018-10-07 DIAGNOSIS — J069 Acute upper respiratory infection, unspecified: Secondary | ICD-10-CM | POA: Diagnosis not present

## 2018-10-07 MED ORDER — GUAIFENESIN ER 600 MG PO TB12: 600.0000 mg | ORAL_TABLET | Freq: Two times a day (BID) | ORAL | 0 refills | Status: DC

## 2018-10-07 MED ORDER — CETIRIZINE HCL 10 MG PO TABS: 10.0000 mg | ORAL_TABLET | Freq: Every day | ORAL | 0 refills | Status: DC

## 2018-10-07 MED ORDER — FLUTICASONE PROPIONATE 50 MCG/ACT NA SUSP: 2.0000 | Freq: Every day | NASAL | 0 refills | Status: DC

## 2018-10-07 MED ORDER — SALINE SPRAY 0.65 % NA SOLN: 1.0000 | NASAL | 0 refills | Status: DC | PRN

## 2018-10-07 NOTE — Telephone Encounter (Signed)
This encounter was created in error - please disregard.

## 2018-10-07 NOTE — Telephone Encounter (Signed)
-----   Message from Flossie Buffy, NP sent at 10/07/2018  9:13 AM EDT ----- Current symptoms: sinus congestion ans sore throat since Thursday. He has high risk of complications if positive. Please schedule for testing.  Insurance: [employer]#: [id number] ID: [id number]  Thank you

## 2018-10-07 NOTE — Telephone Encounter (Signed)
Copied from [place] [id number]. Topic: Appointment Scheduling - Prior Auth Required for Appointment >> Oct 07, 2018  7:34 AM Robina Ade, Helene Kelp D wrote: No appointment has been scheduled. Patient is requesting same day appointment due to sinus. Per scheduling protocol, this appointment requires a prior authorization prior to scheduling.  Route to department's PEC pool.

## 2018-10-07 NOTE — Patient Instructions (Signed)
Sent message to [facility] Pool for COVID testing. You will be contacted with appt time and location for COVID test. Stop OTC afrin and decongestant Maintain adequate oral hydration Use saline for sinus irrigation.

## 2018-10-07 NOTE — Telephone Encounter (Signed)
Pt schedule for today.

## 2018-10-07 NOTE — Telephone Encounter (Signed)
Pt is aware of appt today at [facility] 1:15pm

## 2018-10-07 NOTE — Telephone Encounter (Signed)
-----   Message from Flossie Buffy, NP sent at 10/07/2018  9:13 AM EDT ----- Current symptoms: sinus congestion ans sore throat since Thursday. He has high risk of complications if positive. Please schedule for testing.  Insurance: [employer]#: [id number] ID: [id number]  Thank you

## 2018-10-07 NOTE — Progress Notes (Signed)
Virtual Visit via Video Note  I connected with [name] [name] on 10/07/18 at  9:00 AM EDT by a video enabled telemedicine application and verified that I am speaking with the correct person using two identifiers.  Location: Patient: Home Provider: office   I discussed the limitations of evaluation and management by telemedicine and the availability of in person appointments. The patient expressed understanding and agreed to proceed.  CC: pt is c/o  sinus issue with sore throat,drainage,congestion,nose stop up,eye and ear itchy/4 days/nasal spray didnt help/ pt mention this happened last year as well.   History of Present Illness: Sinusitis This is a new problem. The current episode started in the past 7 days. The problem is unchanged. There has been no fever. Associated symptoms include congestion, coughing, headaches, sinus pressure, sneezing and a sore throat. Pertinent negatives include no chills, diaphoresis, ear pain, hoarse voice, neck pain, shortness of breath or swollen glands. Past treatments include oral decongestants and spray decongestants. The treatment provided mild relief.   Observations/Objective: Physical Exam  Constitutional: He is oriented to person, place, and time. No distress.  HENT:  Right Ear: External ear normal.  Left Ear: External ear normal.  Nose: Nose normal.  Mouth/Throat: No oropharyngeal exudate.  Eyes: Pupils are equal, round, and reactive to light. EOM are normal.  Neck: Normal range of motion. Neck supple.  Pulmonary/Chest: Effort normal.  Neurological: He is alert and oriented to person, place, and time.  Psychiatric: He has a normal mood and affect.  Vitals reviewed.  Assessment and Plan: [name] [name] was seen today for sinusitis.  Diagnoses and all orders for this visit:  Acute URI -     fluticasone (FLONASE) 50 MCG/ACT nasal spray; Place 2 sprays into both nostrils daily. -     sodium chloride (OCEAN) 0.65 % SOLN nasal spray; Place 1 spray  into both nostrils as needed for congestion. -     guaiFENesin (MUCINEX) 600 MG 12 hr tablet; Take 1 tablet (600 mg total) by mouth 2 (two) times daily. -     cetirizine (ZYRTEC) 10 MG tablet; Take 1 tablet (10 mg total) by mouth daily.   Follow Up Instructions: Sent message to [name] [name] for COVID testing. Stop OTC afrin and decongestant Maintain adequate oral hydration Use saline for sinus irrigation.   I discussed the assessment and treatment plan with the patient. The patient was provided an opportunity to ask questions and all were answered. The patient agreed with the plan and demonstrated an understanding of the instructions.   The patient was advised to call back or seek an in-person evaluation if the symptoms worsen or if the condition fails to improve as anticipated.   Wilfred Lacy, NP

## 2018-10-07 NOTE — Telephone Encounter (Signed)
LM for pt to call back @ [phone] M-F 7a-7p to schedule covid testing. Order placed

## 2018-10-08 ENCOUNTER — Encounter: Payer: Self-pay | Admitting: Nurse Practitioner

## 2018-10-09 ENCOUNTER — Telehealth: Payer: Self-pay | Admitting: Specialist

## 2018-10-09 ENCOUNTER — Telehealth (HOSPITAL_COMMUNITY): Payer: Self-pay | Admitting: Licensed Clinical Social Worker

## 2018-10-09 NOTE — Telephone Encounter (Signed)
Pt called in said he would like a call back from christy in regards to his lyrica said he needs a copy of the application that was filled out because there was an issue with it.   [phone]

## 2018-10-09 NOTE — Telephone Encounter (Signed)
Pt called CSW to request help with patient assistance application for his inspra.  Pt states he brought in the application for renewal but when he called [employer] to check they did not have a record of it being sent in.  CSW called clinic who confirms they have the application but it was not signed by the patient- patient to come in tomorrow to sign and have it turned back in.  CSW will continue to follow and assist as needed  Jorge Ny, [facility] Desk#: [phone] Cell#: [phone]

## 2018-10-10 LAB — NOVEL CORONAVIRUS, NAA: SARS-CoV-2, NAA: NOT DETECTED

## 2018-10-10 NOTE — Telephone Encounter (Signed)
I called and advised patient that I did rec'e forms but that I would have to wait for Dr. Louanne Skye to get back to sign them and it would Wednesday 10/16/2018

## 2018-11-07 ENCOUNTER — Telehealth (HOSPITAL_COMMUNITY): Payer: Self-pay | Admitting: Pharmacy Technician

## 2018-11-07 NOTE — Telephone Encounter (Signed)
Received application for [employer] patient assistance for Inspra. Patient came and signed his portion now we can get the provider to sign and will fax it in after that.  Charlann Boxer, CPhT

## 2018-11-18 ENCOUNTER — Other Ambulatory Visit: Payer: Self-pay | Admitting: Nurse Practitioner

## 2018-11-18 ENCOUNTER — Other Ambulatory Visit: Payer: Self-pay | Admitting: Specialist

## 2018-11-18 ENCOUNTER — Other Ambulatory Visit (HOSPITAL_COMMUNITY): Payer: Self-pay | Admitting: Cardiology

## 2018-11-18 DIAGNOSIS — J069 Acute upper respiratory infection, unspecified: Secondary | ICD-10-CM

## 2018-11-20 NOTE — Telephone Encounter (Signed)
Received notification from [place] that patient was approved to receive Inspra through 04/10/2019.  ID: [id number]  [employer] and confirmed that the order was shipped. It can take 7-10 business days to arrive at the office. Order #: [id number] They are sending in a 55 Day supply and would need someone from the office to call when the patient is ready for a refill. The earliest we can request a refill is October 17th.   Called and spoke with patient. Made him aware of shipment and how we need to re-order medication in the future. Told him that someone from the office would give him a call when we receive the medication.  [employer] patient assistance phone number for follow up is [phone].    Charlann Boxer, CPhT

## 2018-11-26 NOTE — Telephone Encounter (Signed)
Samples received and left a front office for patient to pick up.  Patient aware and voiced understanding

## 2018-12-09 ENCOUNTER — Ambulatory Visit (INDEPENDENT_AMBULATORY_CARE_PROVIDER_SITE_OTHER): Payer: Medicare HMO | Admitting: Orthopedic Surgery

## 2018-12-09 ENCOUNTER — Encounter: Payer: Self-pay | Admitting: Orthopedic Surgery

## 2018-12-09 ENCOUNTER — Other Ambulatory Visit: Payer: Self-pay

## 2018-12-09 ENCOUNTER — Ambulatory Visit (INDEPENDENT_AMBULATORY_CARE_PROVIDER_SITE_OTHER): Payer: Medicare HMO

## 2018-12-09 VITALS — Ht 67.0 in | Wt 175.0 lb

## 2018-12-09 DIAGNOSIS — M25552 Pain in left hip: Secondary | ICD-10-CM | POA: Diagnosis not present

## 2018-12-09 DIAGNOSIS — M25562 Pain in left knee: Secondary | ICD-10-CM

## 2018-12-09 DIAGNOSIS — G8929 Other chronic pain: Secondary | ICD-10-CM

## 2018-12-09 DIAGNOSIS — M5116 Intervertebral disc disorders with radiculopathy, lumbar region: Secondary | ICD-10-CM

## 2018-12-09 MED ORDER — PREDNISONE 10 MG PO TABS: 20.0000 mg | ORAL_TABLET | Freq: Every day | ORAL | 0 refills | Status: DC

## 2018-12-09 NOTE — Progress Notes (Signed)
Office Visit Note   Patient: [name] [name]           Date of Birth: 1951-02-08           MRN: [id number] Visit Date: 12/09/2018              Requested by: Flossie Buffy, NP [address] [place],  [place] [address] PCP: Flossie Buffy, NP  Chief Complaint  Patient presents with  . Left Hip - Pain  . Left Knee - Pain      HPI: Patient is a 68 year old gentleman who presents with acute left groin pain that wears up now and then with walking.  Also complains of acute pain over the lateral aspect of the left knee lateral ligaments he feels like it feels like a pole.  Occasionally has some radicular pain down into his foot.  Patient states he is status post several lumbar spine surgeries for left-sided radicular pain status post hemiarthroplasty medial joint line of the left knee.  Assessment & Plan: Visit Diagnoses:  1. Pain in left hip   2. Chronic pain of left knee   3. Lumbar disc herniation with radiculopathy     Plan: Discussed with the patient the groin pain and lateral knee pain may be coming from radicular symptoms.  Discussed that he does have degenerative changes of the patellofemoral and lateral joint line of the left knee no signs of any bony abnormality of the left hip.  We will start him on a low-dose prednisone to see if this will help relieve his symptoms reevaluate in 4 weeks.  Follow-Up Instructions: Return in about 4 weeks (around 01/06/2019).   Ortho Exam  Patient is alert, oriented, no adenopathy, well-dressed, normal affect, normal respiratory effort. Examination patient has a normal gait does not have an abductor lurch she has no pain with internal or external rotation of the left hip.  Straight leg raise is negative he has no focal motor weakness in the left lower extremity the left knee is stable no tenderness to palpation.  Patient states he has had 2 lumbar spine discectomies with Dr. Louanne Skye.  Imaging: Xr Hip Unilat W Or W/o Pelvis 2-3  Views Left  Result Date: 12/09/2018 2 view radiographs of the left hip shows a congruent joint with no avascular necrosis no joint space narrowing.  The visualized portion of the lumbar spine does show some degenerative disc disease.  Xr Knee 1-2 Views Left  Result Date: 12/09/2018 2 view radiographs of the left knee shows a stable unicompartmental knee arthroplasty.  Patient does have large osteophytic bone spurs in the patellofemoral joint as well as joint space narrowing and bony spurs in the lateral joint line.  The hemiarthroplasty is stable.  No images are attached to the encounter.  Labs: No results found for: HGBA1C, ESRSEDRATE, CRP, LABURIC, REPTSTATUS, GRAMSTAIN, CULT, LABORGA   Lab Results  Component Value Date   ALBUMIN 3.9 03/29/2018   ALBUMIN 3.8 10/22/2017   ALBUMIN 4.0 09/13/2015    No results found for: MG No results found for: VD25OH  No results found for: PREALBUMIN CBC EXTENDED Latest Ref Rng & Units 12/25/2016 09/21/2015 09/13/2015  WBC 4.0 - 10.5 K/uL 4.9 10.8(H) 6.0  RBC 4.22 - 5.81 MIL/uL 4.53 4.05(L) 4.87  HGB 13.0 - 17.0 g/dL 14.9 12.3(L) 14.7  HCT 39.0 - 52.0 % 43.2 36.0(L) 42.9  PLT 150 - 400 K/uL 129(L) 145(L) 169  NEUTROABS 1.7 - 7.7 K/uL - - 3.6  LYMPHSABS 0.7 - 4.0 K/uL - - 1.8     Body mass index is 27.41 kg/m.  Orders:  Orders Placed This Encounter  Procedures  . XR HIP UNILAT W OR W/O PELVIS 2-3 VIEWS LEFT  . XR Knee 1-2 Views Left   Meds ordered this encounter  Medications  . predniSONE (DELTASONE) 10 MG tablet    Sig: Take 2 tablets (20 mg total) by mouth daily with breakfast.    Dispense:  60 tablet    Refill:  0     Procedures: No procedures performed  Clinical Data: No additional findings.  ROS:  All other systems negative, except as noted in the HPI. Review of Systems  Objective: Vital Signs: Ht 5\' 7"  (1.702 m)   Wt 175 lb (79.4 kg)   BMI 27.41 kg/m   Specialty Comments:  No specialty comments available.   PMFS History: Patient Active Problem List   Diagnosis Date Noted  . Lumbar disc herniation with radiculopathy 09/20/2015    Class: Acute  . Ganglion cyst of wrist 09/20/2015    Class: Chronic  . OSA (obstructive sleep apnea) 07/22/2014  . Hyperlipidemia 01/08/2013  . Elevated LFTs 01/24/2012  . Chronic systolic heart failure ([place]) 11/11/2010  . Asthma 11/11/2010  . ANAL AND RECTAL POLYP 01/14/2010  . HEARTBURN 01/14/2010  . RECTAL BLEEDING 11/24/2009  . BREAST MASS, LEFT 11/24/2009  . COUGH 05/11/2009  . HYPERTRIGLYCERIDEMIA 12/29/2008  . Chest pain 12/29/2008  . Essential hypertension 03/11/2008  . Nonischemic cardiomyopathy ([place]) 03/11/2008  . Other and unspecified hyperlipidemia 12/25/2006  . ALCOHOLISM 12/25/2006  . ALLERGIC RHINITIS 12/25/2006  . HYPERGLYCEMIA 12/25/2006  . UROLITHIASIS, HX OF 12/25/2006   Past Medical History:  Diagnosis Date  . Allergic rhinitis   . Anxiety   . Asthma    Probable mild intermittent asthma  . CKD (chronic kidney disease)   . Diverticulosis    a. Colonoscopy (1/12) with mild diverticulosis and hemorrhoids  . Elevated LFTs    HCV negative, possibly due to ETOH  . GERD (gastroesophageal reflux disease)    EGD 1/12 with erosive esophagitis and gastritis, biopsy postiive for H pylori (being  treated)  . Gynecomastia    with spironolactone (ok on eplerenone)  . History of back surgery   . HTN (hypertension)   . Hypertriglyceridemia   . Knee osteoarthritis    s/p arhtroscopy 2010  . Nonischemic cardiomyopathy ([place])    [place] 06/2002 with normal cors; Myoview 11/08 neg for ischemia. Echo 4/08: EF of 35-40%.  Cause of NICM unknown.  Never a heavy drinker, used cocaine or amphetamines. HIV, SPEP, and ferritin/Fe studies were all negative in 12/09. Myoview (5/11): EF 42% with apical thinning but no evidence for ischemia or infarction. Echo (3/12): EF 45-50%, no significant valvular abnormalities.   . Rotator cuff tendonitis    with hx of  bilateral shoulder surgery  . Sleep apnea    doesnot use cpap    Family History  Problem Relation Age of Onset  . Stroke Mother   . Prostate cancer Father   . Cardiomyopathy Brother        nonischemic, has icd  . Cirrhosis Brother        alcoholic   . Pancreatitis Sister   . Colon cancer Neg Hx   . Esophageal cancer Neg Hx   . Pancreatic cancer Neg Hx   . Liver disease Neg Hx     Past Surgical History:  Procedure Laterality Date  . ELECTROCARDIOGRAM  09/20/06  . GANGLION CYST EXCISION Right 09/20/2015   Procedure: REMOVAL GANGLION OF WRIST;  Surgeon: Jessy Oto, MD;  Location: [facility];  Service: Orthopedics;  Laterality: Right;  . KNEE ARTHROSCOPY Left    x 2, then partial replacement  . LUMBAR LAMINECTOMY Right 09/20/2015   Procedure: FAR LATERAL APPROACH TO EXCISE HERNIATED NUCLEUS PULPOSUS RIGHT Lumbar 1- Lumbar 2 AND RIGHT Lumbar 2-Lumbar 3, EXCISION OF GANGLION CYST RIGHT VOLAR RADIAL WRIST;  Surgeon: Jessy Oto, MD;  Location: [facility];  Service: Orthopedics;  Laterality: Right;  . MEDIAL PARTIAL KNEE REPLACEMENT Left   . PTCA    . ROTATOR CUFF REPAIR     2R; 3 L  . stress cardiolite  12/15/05  . TRANSTHORACIC ECHOCARDIOGRAM  07/13/06   Social History   Occupational History  . Occupation: Disability  Tobacco Use  . Smoking status: Never Smoker  . Smokeless tobacco: Never Used  . Tobacco comment: denies   Substance and Sexual Activity  . Alcohol use: Yes    Alcohol/week: 6.0 standard drinks    Types: 6 Shots of liquor per week    Comment: weekends  . Drug use: No    Comment: Smoked marijuana on occasion in distant past.   . Sexual activity: Not Currently

## 2018-12-20 ENCOUNTER — Encounter (HOSPITAL_COMMUNITY): Payer: Medicare HMO | Admitting: Cardiology

## 2018-12-23 ENCOUNTER — Ambulatory Visit: Payer: Medicare HMO | Admitting: Orthopedic Surgery

## 2019-01-08 ENCOUNTER — Ambulatory Visit (INDEPENDENT_AMBULATORY_CARE_PROVIDER_SITE_OTHER): Payer: Medicare HMO

## 2019-01-08 ENCOUNTER — Ambulatory Visit (INDEPENDENT_AMBULATORY_CARE_PROVIDER_SITE_OTHER): Payer: Medicare HMO | Admitting: Family

## 2019-01-08 ENCOUNTER — Encounter: Payer: Self-pay | Admitting: Family

## 2019-01-08 DIAGNOSIS — G8929 Other chronic pain: Secondary | ICD-10-CM

## 2019-01-08 DIAGNOSIS — M5441 Lumbago with sciatica, right side: Secondary | ICD-10-CM | POA: Diagnosis not present

## 2019-01-08 MED ORDER — HYDROCODONE-ACETAMINOPHEN 5-325 MG PO TABS: 1.0000 | ORAL_TABLET | Freq: Four times a day (QID) | ORAL | 0 refills | Status: DC | PRN

## 2019-01-08 NOTE — Progress Notes (Signed)
Office Visit Note   Patient: [name] [name]           Date of Birth: 1950/10/09           MRN: [id number] Visit Date: 01/08/2019              Requested by: Flossie Buffy, NP [place],  [place] [address] PCP: Flossie Buffy, NP  No chief complaint on file.     HPI: The patient is a 68 year old gentleman who presents today complaining of right groin pain that is much worse with standing and ambulation.  Pain to the lateral aspect of his hip this radiates down the lateral aspect of his leg to his knee as well as down to his groin the symptoms on his right side are new.  He has a longstanding history of degenerative disc disease with radiculopathy typically this has been on the left side.  Was treated about a month ago with prednisone this did improve his symptoms of the left however he now has worsening on the right.  Has no weakness.  Status post 2 lumbar spine surgeries with Dr. Louanne Skye.    Assessment & Plan: Visit Diagnoses:  1. Chronic bilateral low back pain with right-sided sciatica     Plan: Radiographs obtained of his lumbar spine today.  Will refer to Dr. Ernestina Patches for evaluation and ESI.  We will also update his MRI if cannot get relief with ESI will need to go back to see Dr. Louanne Skye.  Follow-Up Instructions: No follow-ups on file.   Right Hip Exam  Right hip exam is normal.    Back Exam   Tenderness  The patient is experiencing no tenderness.   Range of Motion  The patient has normal back ROM.  Muscle Strength  The patient has normal back strength.  Tests  Straight leg raise right: positive Straight leg raise left: negative      Patient is alert, oriented, no adenopathy, well-dressed, normal affect, normal respiratory effort.   Imaging: No results found. No images are attached to the encounter.  Labs: No results found for: HGBA1C, ESRSEDRATE, CRP, LABURIC, REPTSTATUS, GRAMSTAIN, CULT, LABORGA   Lab Results   Component Value Date   ALBUMIN 3.9 03/29/2018   ALBUMIN 3.8 10/22/2017   ALBUMIN 4.0 09/13/2015    No results found for: MG No results found for: VD25OH  No results found for: PREALBUMIN CBC EXTENDED Latest Ref Rng & Units 12/25/2016 09/21/2015 09/13/2015  WBC 4.0 - 10.5 K/uL 4.9 10.8(H) 6.0  RBC 4.22 - 5.81 MIL/uL 4.53 4.05(L) 4.87  HGB 13.0 - 17.0 g/dL 14.9 12.3(L) 14.7  HCT 39.0 - 52.0 % 43.2 36.0(L) 42.9  PLT 150 - 400 K/uL 129(L) 145(L) 169  NEUTROABS 1.7 - 7.7 K/uL - - 3.6  LYMPHSABS 0.7 - 4.0 K/uL - - 1.8     There is no height or weight on file to calculate BMI.  Orders:  Orders Placed This Encounter  Procedures  . XR Lumbar Spine 2-3 Views  . MR LUMBAR SPINE WO CONTRAST  . Ambulatory referral to Physical Medicine Rehab   No orders of the defined types were placed in this encounter.    Procedures: No procedures performed  Clinical Data: No additional findings.  ROS:  All other systems negative, except as noted in the HPI. Review of Systems  Constitutional: Negative for chills and fever.  Musculoskeletal: Positive for arthralgias and myalgias.  Neurological: Negative for weakness and numbness.  Objective: Vital Signs: There were no vitals taken for this visit.  Specialty Comments:  No specialty comments available.  PMFS History: Patient Active Problem List   Diagnosis Date Noted  . Lumbar disc herniation with radiculopathy 09/20/2015    Class: Acute  . Ganglion cyst of wrist 09/20/2015    Class: Chronic  . OSA (obstructive sleep apnea) 07/22/2014  . Hyperlipidemia 01/08/2013  . Elevated LFTs 01/24/2012  . Chronic systolic heart failure ([facility]) 11/11/2010  . Asthma 11/11/2010  . ANAL AND RECTAL POLYP 01/14/2010  . HEARTBURN 01/14/2010  . RECTAL BLEEDING 11/24/2009  . BREAST MASS, LEFT 11/24/2009  . COUGH 05/11/2009  . HYPERTRIGLYCERIDEMIA 12/29/2008  . Chest pain 12/29/2008  . Essential hypertension 03/11/2008  . Nonischemic cardiomyopathy  ([place]) 03/11/2008  . Other and unspecified hyperlipidemia 12/25/2006  . ALCOHOLISM 12/25/2006  . ALLERGIC RHINITIS 12/25/2006  . HYPERGLYCEMIA 12/25/2006  . UROLITHIASIS, HX OF 12/25/2006   Past Medical History:  Diagnosis Date  . Allergic rhinitis   . Anxiety   . Asthma    Probable mild intermittent asthma  . CKD (chronic kidney disease)   . Diverticulosis    a. Colonoscopy (1/12) with mild diverticulosis and hemorrhoids  . Elevated LFTs    HCV negative, possibly due to ETOH  . GERD (gastroesophageal reflux disease)    EGD 1/12 with erosive esophagitis and gastritis, biopsy postiive for H pylori (being  treated)  . Gynecomastia    with spironolactone (ok on eplerenone)  . History of back surgery   . HTN (hypertension)   . Hypertriglyceridemia   . Knee osteoarthritis    s/p arhtroscopy 2010  . Nonischemic cardiomyopathy ([place])    [place] 06/2002 with normal cors; Myoview 11/08 neg for ischemia. Echo 4/08: EF of 35-40%.  Cause of NICM unknown.  Never a heavy drinker, used cocaine or amphetamines. HIV, SPEP, and ferritin/Fe studies were all negative in 12/09. Myoview (5/11): EF 42% with apical thinning but no evidence for ischemia or infarction. Echo (3/12): EF 45-50%, no significant valvular abnormalities.   . Rotator cuff tendonitis    with hx of bilateral shoulder surgery  . Sleep apnea    doesnot use cpap    Family History  Problem Relation Age of Onset  . Stroke Mother   . Prostate cancer Father   . Cardiomyopathy Brother        nonischemic, has icd  . Cirrhosis Brother        alcoholic   . Pancreatitis Sister   . Colon cancer Neg Hx   . Esophageal cancer Neg Hx   . Pancreatic cancer Neg Hx   . Liver disease Neg Hx     Past Surgical History:  Procedure Laterality Date  . ELECTROCARDIOGRAM  09/20/06  . GANGLION CYST EXCISION Right 09/20/2015   Procedure: REMOVAL GANGLION OF WRIST;  Surgeon: Jessy Oto, MD;  Location: [facility];  Service: Orthopedics;  Laterality: Right;   . KNEE ARTHROSCOPY Left    x 2, then partial replacement  . LUMBAR LAMINECTOMY Right 09/20/2015   Procedure: FAR LATERAL APPROACH TO EXCISE HERNIATED NUCLEUS PULPOSUS RIGHT Lumbar 1- Lumbar 2 AND RIGHT Lumbar 2-Lumbar 3, EXCISION OF GANGLION CYST RIGHT VOLAR RADIAL WRIST;  Surgeon: Jessy Oto, MD;  Location: [facility];  Service: Orthopedics;  Laterality: Right;  . MEDIAL PARTIAL KNEE REPLACEMENT Left   . PTCA    . ROTATOR CUFF REPAIR     2R; 3 L  . stress cardiolite  12/15/05  . TRANSTHORACIC ECHOCARDIOGRAM  07/13/06   Social History   Occupational History  . Occupation: Disability  Tobacco Use  . Smoking status: Never Smoker  . Smokeless tobacco: Never Used  . Tobacco comment: denies   Substance and Sexual Activity  . Alcohol use: Yes    Alcohol/week: 6.0 standard drinks    Types: 6 Shots of liquor per week    Comment: weekends  . Drug use: No    Comment: Smoked marijuana on occasion in distant past.   . Sexual activity: Not Currently

## 2019-01-13 ENCOUNTER — Other Ambulatory Visit (HOSPITAL_COMMUNITY): Payer: Self-pay | Admitting: Internal Medicine

## 2019-01-13 ENCOUNTER — Other Ambulatory Visit (HOSPITAL_COMMUNITY): Payer: Self-pay | Admitting: *Deleted

## 2019-01-13 NOTE — Telephone Encounter (Signed)
Pt left VM requesting a refill to [facility] for [facility]. Per Benjamine Mola Hines,CPht note below the earliest we can request a refill is 01/25/2019. I called pt and left detailed VM.   "Received notification from [facility] that patient was approved to receive Inspra through 04/10/2019.  ID: [id number]  [facility] [employer] and confirmed that the order was shipped. It can take 7-10 business days to arrive at the office. Order #: [id number] They are sending in a 1 Day supply and would need someone from the office to call when the patient is ready for a refill. The earliest we can request a refill is October 17th.   Called and spoke with patient. Made him aware of shipment and how we need to re-order medication in the future. Told him that someone from the office would give him a call when we receive the medication.  [employer] patient assistance phone number for follow up is [phone].    Charlann Boxer, CPhT"

## 2019-01-14 ENCOUNTER — Encounter: Payer: Self-pay | Admitting: Family

## 2019-01-14 ENCOUNTER — Ambulatory Visit (INDEPENDENT_AMBULATORY_CARE_PROVIDER_SITE_OTHER): Payer: Medicare HMO | Admitting: Family

## 2019-01-14 ENCOUNTER — Ambulatory Visit (INDEPENDENT_AMBULATORY_CARE_PROVIDER_SITE_OTHER): Payer: Medicare HMO

## 2019-01-14 VITALS — Ht 67.0 in | Wt 175.0 lb

## 2019-01-14 DIAGNOSIS — M25511 Pain in right shoulder: Secondary | ICD-10-CM

## 2019-01-14 DIAGNOSIS — S4991XA Unspecified injury of right shoulder and upper arm, initial encounter: Secondary | ICD-10-CM

## 2019-01-14 MED ORDER — HYDROCODONE-ACETAMINOPHEN 5-325 MG PO TABS: 1.0000 | ORAL_TABLET | Freq: Four times a day (QID) | ORAL | 0 refills | Status: DC | PRN

## 2019-01-14 MED ORDER — LIDOCAINE HCL 1 % IJ SOLN
5.0000 mL | INTRAMUSCULAR | Status: AC | PRN
  Administered 2019-01-14: 5 mL

## 2019-01-14 MED ORDER — METHYLPREDNISOLONE ACETATE 40 MG/ML IJ SUSP
40.0000 mg | INTRAMUSCULAR | Status: AC | PRN
  Administered 2019-01-14: 40 mg via INTRA_ARTICULAR

## 2019-01-14 NOTE — Progress Notes (Signed)
Office Visit Note   Patient: [name] [name]           Date of Birth: [birth year]           MRN: [id number] Visit Date: 01/14/2019              Requested by: Flossie Buffy, NP [address] [place],  [place] [address] PCP: Flossie Buffy, NP  Chief Complaint  Patient presents with  . Right Shoulder - Pain      HPI: The patient is a 68 year old gentleman who presents today for initial evaluation of right shoulder pain.  He states he was walking up his dog when he slipped on some tolerance he fell and caught himself on his right outstretched hand.  This began last Wednesday he has had pain in the right side of his neck this radiates across his shoulder and down his upper arm he has been using heat with minimal relief.  Pain level of 8-10 he almost went to the emergency department last Friday for pain.  He has been taking hydrocodone for pain as needed from pain different that prescription.  Complaining of aching pain has a history of rotator cuff surgery on the right as well as twice on the left.  He is on sure whether it was repaired.  Shooting pain down deltoid and bicep. Intermittent numbness in upper arm.  Assessment & Plan: Visit Diagnoses:  1. Right shoulder pain, unspecified chronicity   2. Right shoulder injury, initial encounter     Plan: Depo-Medrol injection today patient tolerated well discussed conservative treatment.  He like to proceed with MRI.  He feels strongly that he is torn his rotator cuff.  Follow-Up Instructions: Return in about 4 weeks (around 02/11/2019).   Right Shoulder Exam   Tenderness  The patient is experiencing tenderness in the biceps tendon.  Range of Motion  Active abduction: 80   Muscle Strength  The patient has normal right shoulder strength.  Tests  Impingement: positive Drop arm: negative      Patient is alert, oriented, no adenopathy, well-dressed, normal affect, normal respiratory effort. No c spine  tenderness. Left paraspinal tenderness.  Imaging: Xr Shoulder Right  Result Date: 01/14/2019 Radiographs of right shoulder show superior migration of humeral head. No acute finding.   No images are attached to the encounter.  Labs: No results found for: HGBA1C, ESRSEDRATE, CRP, LABURIC, REPTSTATUS, GRAMSTAIN, CULT, LABORGA   Lab Results  Component Value Date   ALBUMIN 3.9 03/29/2018   ALBUMIN 3.8 10/22/2017   ALBUMIN 4.0 09/13/2015    No results found for: MG No results found for: VD25OH  No results found for: PREALBUMIN CBC EXTENDED Latest Ref Rng & Units 12/25/2016 09/21/2015 09/13/2015  WBC 4.0 - 10.5 K/uL 4.9 10.8(H) 6.0  RBC 4.22 - 5.81 MIL/uL 4.53 4.05(L) 4.87  HGB 13.0 - 17.0 g/dL 14.9 12.3(L) 14.7  HCT 39.0 - 52.0 % 43.2 36.0(L) 42.9  PLT 150 - 400 K/uL 129(L) 145(L) 169  NEUTROABS 1.7 - 7.7 K/uL - - 3.6  LYMPHSABS 0.7 - 4.0 K/uL - - 1.8     Body mass index is 27.41 kg/m.  Orders:  Orders Placed This Encounter  Procedures  . XR Shoulder Right  . MR SHOULDER RIGHT WO CONTRAST   No orders of the defined types were placed in this encounter.    Procedures: Large Joint Inj: R subacromial bursa on 01/14/2019 10:29 AM Indications: pain Details: 22 G 1.5 in needle Medications:  5 mL lidocaine 1 %; 40 mg methylPREDNISolone acetate 40 MG/ML Consent was given by the patient.      Clinical Data: No additional findings.  ROS:  All other systems negative, except as noted in the HPI. Review of Systems  Objective: Vital Signs: Ht 5\' 7"  (1.702 m)   Wt 175 lb (79.4 kg)   BMI 27.41 kg/m   Specialty Comments:  No specialty comments available.  PMFS History: Patient Active Problem List   Diagnosis Date Noted  . Lumbar disc herniation with radiculopathy 09/20/2015    Class: Acute  . Ganglion cyst of wrist 09/20/2015    Class: Chronic  . OSA (obstructive sleep apnea) 07/22/2014  . Hyperlipidemia 01/08/2013  . Elevated LFTs 01/24/2012  . Chronic systolic  heart failure ([place]) 11/11/2010  . Asthma 11/11/2010  . ANAL AND RECTAL POLYP 01/14/2010  . HEARTBURN 01/14/2010  . RECTAL BLEEDING 11/24/2009  . BREAST MASS, LEFT 11/24/2009  . COUGH 05/11/2009  . HYPERTRIGLYCERIDEMIA 12/29/2008  . Chest pain 12/29/2008  . Essential hypertension 03/11/2008  . Nonischemic cardiomyopathy ([place]) 03/11/2008  . Other and unspecified hyperlipidemia 12/25/2006  . ALCOHOLISM 12/25/2006  . ALLERGIC RHINITIS 12/25/2006  . HYPERGLYCEMIA 12/25/2006  . UROLITHIASIS, HX OF 12/25/2006   Past Medical History:  Diagnosis Date  . Allergic rhinitis   . Anxiety   . Asthma    Probable mild intermittent asthma  . CKD (chronic kidney disease)   . Diverticulosis    a. Colonoscopy (1/12) with mild diverticulosis and hemorrhoids  . Elevated LFTs    HCV negative, possibly due to ETOH  . GERD (gastroesophageal reflux disease)    EGD 1/12 with erosive esophagitis and gastritis, biopsy postiive for H pylori (being  treated)  . Gynecomastia    with spironolactone (ok on eplerenone)  . History of back surgery   . HTN (hypertension)   . Hypertriglyceridemia   . Knee osteoarthritis    s/p arhtroscopy 2010  . Nonischemic cardiomyopathy ([place])    [place] 06/2002 with normal cors; Myoview 11/08 neg for ischemia. Echo 4/08: EF of 35-40%.  Cause of NICM unknown.  Never a heavy drinker, used cocaine or amphetamines. HIV, SPEP, and ferritin/Fe studies were all negative in 12/09. Myoview (5/11): EF 42% with apical thinning but no evidence for ischemia or infarction. Echo (3/12): EF 45-50%, no significant valvular abnormalities.   . Rotator cuff tendonitis    with hx of bilateral shoulder surgery  . Sleep apnea    doesnot use cpap    Family History  Problem Relation Age of Onset  . Stroke Mother   . Prostate cancer Father   . Cardiomyopathy Brother        nonischemic, has icd  . Cirrhosis Brother        alcoholic   . Pancreatitis Sister   . Colon cancer Neg Hx   . Esophageal  cancer Neg Hx   . Pancreatic cancer Neg Hx   . Liver disease Neg Hx     Past Surgical History:  Procedure Laterality Date  . ELECTROCARDIOGRAM  09/20/06  . GANGLION CYST EXCISION Right 09/20/2015   Procedure: REMOVAL GANGLION OF WRIST;  Surgeon: Jessy Oto, MD;  Location: [facility];  Service: Orthopedics;  Laterality: Right;  . KNEE ARTHROSCOPY Left    x 2, then partial replacement  . LUMBAR LAMINECTOMY Right 09/20/2015   Procedure: FAR LATERAL APPROACH TO EXCISE HERNIATED NUCLEUS PULPOSUS RIGHT Lumbar 1- Lumbar 2 AND RIGHT Lumbar 2-Lumbar 3, EXCISION OF GANGLION CYST RIGHT  VOLAR RADIAL WRIST;  Surgeon: Jessy Oto, MD;  Location: [facility];  Service: Orthopedics;  Laterality: Right;  . MEDIAL PARTIAL KNEE REPLACEMENT Left   . PTCA    . ROTATOR CUFF REPAIR     2R; 3 L  . stress cardiolite  12/15/05  . TRANSTHORACIC ECHOCARDIOGRAM  07/13/06   Social History   Occupational History  . Occupation: Disability  Tobacco Use  . Smoking status: Never Smoker  . Smokeless tobacco: Never Used  . Tobacco comment: denies   Substance and Sexual Activity  . Alcohol use: Yes    Alcohol/week: 6.0 standard drinks    Types: 6 Shots of liquor per week    Comment: weekends  . Drug use: No    Comment: Smoked marijuana on occasion in distant past.   . Sexual activity: Not Currently

## 2019-01-27 ENCOUNTER — Telehealth: Payer: Self-pay | Admitting: Physical Medicine and Rehabilitation

## 2019-01-27 NOTE — Telephone Encounter (Signed)
Rescheduled for 11/9. Auth ok.

## 2019-01-27 NOTE — Telephone Encounter (Signed)
Would be best to do after imaging in-case that makes a better judgment as to what injection or which spot

## 2019-01-29 ENCOUNTER — Telehealth: Payer: Self-pay | Admitting: Orthopedic Surgery

## 2019-01-29 ENCOUNTER — Other Ambulatory Visit: Payer: Self-pay | Admitting: Orthopedic Surgery

## 2019-01-29 MED ORDER — HYDROCODONE-ACETAMINOPHEN 5-325 MG PO TABS: 1.0000 | ORAL_TABLET | Freq: Four times a day (QID) | ORAL | 0 refills | Status: DC | PRN

## 2019-01-29 NOTE — Telephone Encounter (Signed)
rx sent

## 2019-01-29 NOTE — Telephone Encounter (Signed)
Please advise, thank you.

## 2019-01-29 NOTE — Telephone Encounter (Signed)
Patient left a voicemail stating that his back and shoulder have been bothering him and wanted to know if Dr. Sharol Given would send in an RX for the 5mg  Hydrocodone or something stronger to the [facility] on [address].  His MRI is not scheduled until November.  CB#[phone].  Thank you.

## 2019-02-06 ENCOUNTER — Encounter: Payer: Medicare HMO | Admitting: Physical Medicine and Rehabilitation

## 2019-02-10 ENCOUNTER — Ambulatory Visit
Admission: RE | Admit: 2019-02-10 | Discharge: 2019-02-10 | Disposition: A | Discharge status: Home / Self Care | Payer: Medicare HMO | Source: Ambulatory Visit | Attending: Family | Admitting: Family

## 2019-02-10 ENCOUNTER — Other Ambulatory Visit: Payer: Self-pay

## 2019-02-10 DIAGNOSIS — G8929 Other chronic pain: Secondary | ICD-10-CM

## 2019-02-12 DIAGNOSIS — H2510 Age-related nuclear cataract, unspecified eye: Secondary | ICD-10-CM | POA: Insufficient documentation

## 2019-02-12 DIAGNOSIS — H0230 Blepharochalasis unspecified eye, unspecified eyelid: Secondary | ICD-10-CM | POA: Insufficient documentation

## 2019-02-12 DIAGNOSIS — H18419 Arcus senilis, unspecified eye: Secondary | ICD-10-CM | POA: Insufficient documentation

## 2019-02-12 DIAGNOSIS — H25049 Posterior subcapsular polar age-related cataract, unspecified eye: Secondary | ICD-10-CM | POA: Insufficient documentation

## 2019-02-12 DIAGNOSIS — H25012 Cortical age-related cataract, left eye: Secondary | ICD-10-CM | POA: Insufficient documentation

## 2019-02-12 DIAGNOSIS — Z961 Presence of intraocular lens: Secondary | ICD-10-CM | POA: Insufficient documentation

## 2019-02-13 ENCOUNTER — Other Ambulatory Visit: Payer: Self-pay

## 2019-02-13 ENCOUNTER — Ambulatory Visit
Admission: RE | Admit: 2019-02-13 | Discharge: 2019-02-13 | Disposition: A | Discharge status: Home / Self Care | Payer: Medicare HMO | Source: Ambulatory Visit | Attending: Family | Admitting: Family

## 2019-02-13 DIAGNOSIS — S4991XA Unspecified injury of right shoulder and upper arm, initial encounter: Secondary | ICD-10-CM

## 2019-02-13 DIAGNOSIS — M25511 Pain in right shoulder: Secondary | ICD-10-CM

## 2019-02-17 ENCOUNTER — Other Ambulatory Visit: Payer: Self-pay

## 2019-02-17 ENCOUNTER — Ambulatory Visit (INDEPENDENT_AMBULATORY_CARE_PROVIDER_SITE_OTHER): Payer: Medicare HMO | Admitting: Physical Medicine and Rehabilitation

## 2019-02-17 ENCOUNTER — Ambulatory Visit: Payer: Self-pay

## 2019-02-17 ENCOUNTER — Encounter: Payer: Self-pay | Admitting: Physical Medicine and Rehabilitation

## 2019-02-17 VITALS — BP 112/75 | HR 66

## 2019-02-17 DIAGNOSIS — M5416 Radiculopathy, lumbar region: Secondary | ICD-10-CM

## 2019-02-17 DIAGNOSIS — M47816 Spondylosis without myelopathy or radiculopathy, lumbar region: Secondary | ICD-10-CM

## 2019-02-17 DIAGNOSIS — M48062 Spinal stenosis, lumbar region with neurogenic claudication: Secondary | ICD-10-CM

## 2019-02-17 DIAGNOSIS — M961 Postlaminectomy syndrome, not elsewhere classified: Secondary | ICD-10-CM

## 2019-02-17 MED ORDER — BETAMETHASONE SOD PHOS & ACET 6 (3-3) MG/ML IJ SUSP
12.0000 mg | Freq: Once | INTRAMUSCULAR | Status: AC
  Administered 2019-02-17: 12 mg

## 2019-02-17 NOTE — Progress Notes (Signed)
.  Numeric Pain Rating Scale and Functional Assessment Average Pain 7   In the last MONTH (on 0-10 scale) has pain interfered with the following?  1. General activity like being  able to carry out your everyday physical activities such as walking, climbing stairs, carrying groceries, or moving a chair?  Rating(8)   +Driver, -BT, -Dye Allergies.  

## 2019-02-18 ENCOUNTER — Ambulatory Visit (INDEPENDENT_AMBULATORY_CARE_PROVIDER_SITE_OTHER): Payer: Medicare HMO | Admitting: Family

## 2019-02-18 ENCOUNTER — Encounter: Payer: Self-pay | Admitting: Family

## 2019-02-18 VITALS — Ht 67.0 in | Wt 175.0 lb

## 2019-02-18 DIAGNOSIS — M25511 Pain in right shoulder: Secondary | ICD-10-CM

## 2019-02-18 MED ORDER — HYDROCODONE-ACETAMINOPHEN 5-325 MG PO TABS: 1.0000 | ORAL_TABLET | Freq: Four times a day (QID) | ORAL | 0 refills | Status: DC | PRN

## 2019-02-18 NOTE — Progress Notes (Signed)
Office Visit Note   Patient: [name] [name]           Date of Birth: [birth year]           MRN: [id number] Visit Date: 02/18/2019              Requested by: Flossie Buffy, NP [address] [place],  [place] [address] PCP: Flossie Buffy, NP  Chief Complaint  Patient presents with  . Right Shoulder - Follow-up      HPI: Patient presents in follow up to review his right shoulder MRI. He has had previous surgery x2 on his right shoulder. At his last visit he had an injection which helped for a couple of weeks  Assessment & Plan: Visit Diagnoses: Right recurrent Rotator cuff tear  Plan: From a non surgical perspective he can return in 2 months for another injection. As his motion is fairly good he may benefit from a shoulder scope to relieve his symptoms. He would like to think about this. He has asked for a refill of his norco and still has a few left and is only taking 2 a day. I will refill this one final time and he understands this will be his last refill  Follow-Up Instructions: No follow-ups on file.   Ortho Exam  Patient is alert, oriented, no adenopathy, well-dressed, normal affect, normal respiratory effort. Right shoulder. Distal CMS is intact. Abduction to 50 degrees. Internal rotation to above his belt line.   Imaging: MrI right shoulder demonstrates complete tearing of the rotator cuff with 3.7 cm of retraction Labs: No results found for: HGBA1C, ESRSEDRATE, CRP, LABURIC, REPTSTATUS, GRAMSTAIN, CULT, LABORGA   Lab Results  Component Value Date   ALBUMIN 3.9 03/29/2018   ALBUMIN 3.8 10/22/2017   ALBUMIN 4.0 09/13/2015    No results found for: MG No results found for: VD25OH  No results found for: PREALBUMIN CBC EXTENDED Latest Ref Rng & Units 12/25/2016 09/21/2015 09/13/2015  WBC 4.0 - 10.5 K/uL 4.9 10.8(H) 6.0  RBC 4.22 - 5.81 MIL/uL 4.53 4.05(L) 4.87  HGB 13.0 - 17.0 g/dL 14.9 12.3(L) 14.7  HCT 39.0 - 52.0 % 43.2 36.0(L) 42.9  PLT 150 -  400 K/uL 129(L) 145(L) 169  NEUTROABS 1.7 - 7.7 K/uL - - 3.6  LYMPHSABS 0.7 - 4.0 K/uL - - 1.8     Body mass index is 27.41 kg/m.  Orders:  No orders of the defined types were placed in this encounter.  Meds ordered this encounter  Medications  . HYDROcodone-acetaminophen (NORCO) 5-325 MG tablet    Sig: Take 1 tablet by mouth every 6 (six) hours as needed.    Dispense:  30 tablet    Refill:  0     Procedures: No procedures performed  Clinical Data: No additional findings.  ROS:  All other systems negative, except as noted in the HPI. Review of Systems  Objective: Vital Signs: Ht 5\' 7"  (1.702 m)   Wt 175 lb (79.4 kg)   BMI 27.41 kg/m   Specialty Comments:  No specialty comments available.  PMFS History: Patient Active Problem List   Diagnosis Date Noted  . Lumbar disc herniation with radiculopathy 09/20/2015    Class: Acute  . Ganglion cyst of wrist 09/20/2015    Class: Chronic  . OSA (obstructive sleep apnea) 07/22/2014  . Hyperlipidemia 01/08/2013  . Elevated LFTs 01/24/2012  . Chronic systolic heart failure ([place]) 11/11/2010  . Asthma 11/11/2010  . ANAL AND RECTAL POLYP  01/14/2010  . HEARTBURN 01/14/2010  . RECTAL BLEEDING 11/24/2009  . BREAST MASS, LEFT 11/24/2009  . COUGH 05/11/2009  . HYPERTRIGLYCERIDEMIA 12/29/2008  . Chest pain 12/29/2008  . Essential hypertension 03/11/2008  . Nonischemic cardiomyopathy ([place]) 03/11/2008  . Other and unspecified hyperlipidemia 12/25/2006  . ALCOHOLISM 12/25/2006  . ALLERGIC RHINITIS 12/25/2006  . HYPERGLYCEMIA 12/25/2006  . UROLITHIASIS, HX OF 12/25/2006   Past Medical History:  Diagnosis Date  . Allergic rhinitis   . Anxiety   . Asthma    Probable mild intermittent asthma  . CKD (chronic kidney disease)   . Diverticulosis    a. Colonoscopy (1/12) with mild diverticulosis and hemorrhoids  . Elevated LFTs    HCV negative, possibly due to ETOH  . GERD (gastroesophageal reflux disease)    EGD 1/12 with  erosive esophagitis and gastritis, biopsy postiive for H pylori (being  treated)  . Gynecomastia    with spironolactone (ok on eplerenone)  . History of back surgery   . HTN (hypertension)   . Hypertriglyceridemia   . Knee osteoarthritis    s/p arhtroscopy 2010  . Nonischemic cardiomyopathy ([place])    [place] 06/2002 with normal cors; Myoview 11/08 neg for ischemia. Echo 4/08: EF of 35-40%.  Cause of NICM unknown.  Never a heavy drinker, used cocaine or amphetamines. HIV, SPEP, and ferritin/Fe studies were all negative in 12/09. Myoview (5/11): EF 42% with apical thinning but no evidence for ischemia or infarction. Echo (3/12): EF 45-50%, no significant valvular abnormalities.   . Rotator cuff tendonitis    with hx of bilateral shoulder surgery  . Sleep apnea    doesnot use cpap    Family History  Problem Relation Age of Onset  . Stroke Mother   . Prostate cancer Father   . Cardiomyopathy Brother        nonischemic, has icd  . Cirrhosis Brother        alcoholic   . Pancreatitis Sister   . Colon cancer Neg Hx   . Esophageal cancer Neg Hx   . Pancreatic cancer Neg Hx   . Liver disease Neg Hx     Past Surgical History:  Procedure Laterality Date  . ELECTROCARDIOGRAM  09/20/06  . GANGLION CYST EXCISION Right 09/20/2015   Procedure: REMOVAL GANGLION OF WRIST;  Surgeon: Jessy Oto, MD;  Location: [facility];  Service: Orthopedics;  Laterality: Right;  . KNEE ARTHROSCOPY Left    x 2, then partial replacement  . LUMBAR LAMINECTOMY Right 09/20/2015   Procedure: FAR LATERAL APPROACH TO EXCISE HERNIATED NUCLEUS PULPOSUS RIGHT Lumbar 1- Lumbar 2 AND RIGHT Lumbar 2-Lumbar 3, EXCISION OF GANGLION CYST RIGHT VOLAR RADIAL WRIST;  Surgeon: Jessy Oto, MD;  Location: [facility];  Service: Orthopedics;  Laterality: Right;  . MEDIAL PARTIAL KNEE REPLACEMENT Left   . PTCA    . ROTATOR CUFF REPAIR     2R; 3 L  . stress cardiolite  12/15/05  . TRANSTHORACIC ECHOCARDIOGRAM  07/13/06   Social History    Occupational History  . Occupation: Disability  Tobacco Use  . Smoking status: Never Smoker  . Smokeless tobacco: Never Used  . Tobacco comment: denies   Substance and Sexual Activity  . Alcohol use: Yes    Alcohol/week: 6.0 standard drinks    Types: 6 Shots of liquor per week    Comment: weekends  . Drug use: No    Comment: Smoked marijuana on occasion in distant past.   . Sexual activity: Not Currently

## 2019-02-19 ENCOUNTER — Telehealth (HOSPITAL_COMMUNITY): Payer: Self-pay | Admitting: Licensed Clinical Social Worker

## 2019-02-19 NOTE — Telephone Encounter (Signed)
Patient called to request assistance with placing a new order of Inspra.  Order [id number] Anticipated delivery in 7-10 business days  CSW will continue to follow and assist as needed  Jorge Ny, [facility] Desk#: [phone] Cell#: [phone]

## 2019-02-21 ENCOUNTER — Encounter (HOSPITAL_COMMUNITY): Payer: Medicare HMO | Admitting: Cardiology

## 2019-02-27 ENCOUNTER — Encounter (HOSPITAL_COMMUNITY): Payer: Medicare HMO | Admitting: Cardiology

## 2019-03-07 ENCOUNTER — Other Ambulatory Visit (HOSPITAL_COMMUNITY): Payer: Self-pay | Admitting: Cardiology

## 2019-03-10 ENCOUNTER — Other Ambulatory Visit (HOSPITAL_COMMUNITY): Payer: Self-pay | Admitting: Internal Medicine

## 2019-03-10 ENCOUNTER — Other Ambulatory Visit (HOSPITAL_COMMUNITY): Payer: Self-pay

## 2019-03-10 MED ORDER — CARVEDILOL 12.5 MG PO TABS: 12.5000 mg | ORAL_TABLET | Freq: Two times a day (BID) | ORAL | 0 refills | Status: DC

## 2019-03-11 ENCOUNTER — Encounter: Payer: Self-pay | Admitting: Family

## 2019-03-11 ENCOUNTER — Other Ambulatory Visit: Payer: Self-pay

## 2019-03-11 ENCOUNTER — Ambulatory Visit (INDEPENDENT_AMBULATORY_CARE_PROVIDER_SITE_OTHER): Payer: Medicare HMO | Admitting: Family

## 2019-03-11 VITALS — Ht 67.0 in | Wt 175.0 lb

## 2019-03-11 DIAGNOSIS — M67911 Unspecified disorder of synovium and tendon, right shoulder: Secondary | ICD-10-CM

## 2019-03-11 DIAGNOSIS — M5116 Intervertebral disc disorders with radiculopathy, lumbar region: Secondary | ICD-10-CM

## 2019-03-11 DIAGNOSIS — M25511 Pain in right shoulder: Secondary | ICD-10-CM

## 2019-03-11 MED ORDER — METHYLPREDNISOLONE ACETATE 40 MG/ML IJ SUSP
40.0000 mg | INTRAMUSCULAR | Status: AC | PRN
  Administered 2019-03-11: 40 mg via INTRA_ARTICULAR

## 2019-03-11 MED ORDER — HYDROCODONE-ACETAMINOPHEN 5-325 MG PO TABS: 1.0000 | ORAL_TABLET | Freq: Four times a day (QID) | ORAL | 0 refills | Status: DC | PRN

## 2019-03-11 MED ORDER — LIDOCAINE HCL 1 % IJ SOLN
5.0000 mL | INTRAMUSCULAR | Status: AC | PRN
  Administered 2019-03-11: 5 mL

## 2019-03-11 NOTE — Addendum Note (Signed)
Addended by: Dondra Prader R on: 03/11/2019 03:51 PM   Modules accepted: Orders

## 2019-03-11 NOTE — Progress Notes (Signed)
Office Visit Note   Patient: [name] [name]           Date of Birth: 06-Dec-1950           MRN: [id number] Visit Date: 03/11/2019              Requested by: Flossie Buffy, NP [address] [place],  [place] [address] PCP: Flossie Buffy, NP  Chief Complaint  Patient presents with   Lower Back - Pain   Left Leg - Pain   Right Shoulder - Pain      HPI: The patient is a 68 year old gentleman who presents today in follow-up he is status post ESI with Dr. Lindon Romp 02/17/19. Today is complaining of left low back pain that radiates across his low back and hips bilaterally.  Cramping thigh pain on the left this pain is 6 out of 10 is constant shooting pain that radiates down the lateral aspect of his leg all the way to his ankle.  After his ESI he reports that the shooting pain he had in his groin has resolved and he is no longer having giving way of his left lower extremity but continues to have constant pain.  He has been using Lyrica and hydrocodone with moderate relief of his symptoms  He is also complaining of right shoulder pain for him this is chronic.  He did have a fall recently which aggravated his pain he has a history of tear.  Has had a history of 2 surgeries on his right shoulder wonders if he could proceed with rotator cuff repair on this side.  He had good relief with a Depo-Medrol injection October 6.  Requests repeat injection today.  Assessment & Plan: Visit Diagnoses:  1. Lumbar disc herniation with radiculopathy     Plan: Depo-Medrol injection of the right shoulder.  Will refer to Dr. Ernestina Patches for repeat ESI.  Following this visit he will need to follow-up with Dr. Louanne Skye.  Today he schedule an appointment with Dr. Louanne Skye for 6 weeks from today.  His lumbar spine MRI was updated on November 2.  Follow-Up Instructions: Return for nitka.   Back Exam   Tenderness  The patient is experiencing tenderness in the lumbar.  Range of Motion  The patient has  normal back ROM.  Muscle Strength  The patient has normal back strength.  Tests  Straight leg raise right: positive Straight leg raise left: positive   Right Shoulder Exam   Tenderness  The patient is experiencing tenderness in the biceps tendon.  Tests  Impingement: positive Drop arm: negative      Patient is alert, oriented, no adenopathy, well-dressed, normal affect, normal respiratory effort.   Imaging: No results found. No images are attached to the encounter.  Labs: No results found for: HGBA1C, ESRSEDRATE, CRP, LABURIC, REPTSTATUS, GRAMSTAIN, CULT, LABORGA   Lab Results  Component Value Date   ALBUMIN 3.9 03/29/2018   ALBUMIN 3.8 10/22/2017   ALBUMIN 4.0 09/13/2015    No results found for: MG No results found for: VD25OH  No results found for: PREALBUMIN CBC EXTENDED Latest Ref Rng & Units 12/25/2016 09/21/2015 09/13/2015  WBC 4.0 - 10.5 K/uL 4.9 10.8(H) 6.0  RBC 4.22 - 5.81 MIL/uL 4.53 4.05(L) 4.87  HGB 13.0 - 17.0 g/dL 14.9 12.3(L) 14.7  HCT 39.0 - 52.0 % 43.2 36.0(L) 42.9  PLT 150 - 400 K/uL 129(L) 145(L) 169  NEUTROABS 1.7 - 7.7 K/uL - - 3.6  LYMPHSABS 0.7 - 4.0  K/uL - - 1.8     Body mass index is 27.41 kg/m.  Orders:  Orders Placed This Encounter  Procedures   Ambulatory referral to Physical Medicine Rehab   No orders of the defined types were placed in this encounter.    Procedures: Large Joint Inj: R subacromial bursa on 03/11/2019 3:34 PM Indications: pain Details: 22 G 1.5 in needle Medications: 5 mL lidocaine 1 %; 40 mg methylPREDNISolone acetate 40 MG/ML Consent was given by the patient.      Clinical Data: No additional findings.  ROS:  All other systems negative, except as noted in the HPI. Review of Systems  Constitutional: Negative for chills and fever.  Musculoskeletal: Positive for arthralgias, back pain and myalgias.  Neurological: Negative for weakness and numbness.    Objective: Vital Signs: Ht 5\' 7"   (1.702 m)    Wt 175 lb (79.4 kg)    BMI 27.41 kg/m   Specialty Comments:  No specialty comments available.  PMFS History: Patient Active Problem List   Diagnosis Date Noted   Lumbar disc herniation with radiculopathy 09/20/2015    Class: Acute   Ganglion cyst of wrist 09/20/2015    Class: Chronic   OSA (obstructive sleep apnea) 07/22/2014   Hyperlipidemia 01/08/2013   Elevated LFTs 0000000   Chronic systolic heart failure ([place]) 11/11/2010   Asthma 11/11/2010   ANAL AND RECTAL POLYP 01/14/2010   HEARTBURN 01/14/2010   RECTAL BLEEDING 11/24/2009   BREAST MASS, LEFT 11/24/2009   COUGH 05/11/2009   HYPERTRIGLYCERIDEMIA 12/29/2008   Chest pain 12/29/2008   Essential hypertension 03/11/2008   Nonischemic cardiomyopathy ([place]) 03/11/2008   Other and unspecified hyperlipidemia 12/25/2006   ALCOHOLISM 12/25/2006   ALLERGIC RHINITIS 12/25/2006   HYPERGLYCEMIA 12/25/2006   UROLITHIASIS, HX OF 12/25/2006   Past Medical History:  Diagnosis Date   Allergic rhinitis    Anxiety    Asthma    Probable mild intermittent asthma   CKD (chronic kidney disease)    Diverticulosis    a. Colonoscopy (1/12) with mild diverticulosis and hemorrhoids   Elevated LFTs    HCV negative, possibly due to ETOH   GERD (gastroesophageal reflux disease)    EGD 1/12 with erosive esophagitis and gastritis, biopsy postiive for H pylori (being  treated)   Gynecomastia    with spironolactone (ok on eplerenone)   History of back surgery    HTN (hypertension)    Hypertriglyceridemia    Knee osteoarthritis    s/p arhtroscopy 2010   Nonischemic cardiomyopathy ([place])    [place] 06/2002 with normal cors; Myoview 11/08 neg for ischemia. Echo 4/08: EF of 35-40%.  Cause of NICM unknown.  Never a heavy drinker, used cocaine or amphetamines. HIV, SPEP, and ferritin/Fe studies were all negative in 12/09. Myoview (5/11): EF 42% with apical thinning but no evidence for ischemia or  infarction. Echo (3/12): EF 45-50%, no significant valvular abnormalities.    Rotator cuff tendonitis    with hx of bilateral shoulder surgery   Sleep apnea    doesnot use cpap    Family History  Problem Relation Age of Onset   Stroke Mother    Prostate cancer Father    Cardiomyopathy Brother        nonischemic, has icd   Cirrhosis Brother        alcoholic    Pancreatitis Sister    Colon cancer Neg Hx    Esophageal cancer Neg Hx    Pancreatic cancer Neg Hx    Liver  disease Neg Hx     Past Surgical History:  Procedure Laterality Date   ELECTROCARDIOGRAM  09/20/06   GANGLION CYST EXCISION Right 09/20/2015   Procedure: REMOVAL GANGLION OF WRIST;  Surgeon: Jessy Oto, MD;  Location: [facility];  Service: Orthopedics;  Laterality: Right;   KNEE ARTHROSCOPY Left    x 2, then partial replacement   LUMBAR LAMINECTOMY Right 09/20/2015   Procedure: FAR LATERAL APPROACH TO EXCISE HERNIATED NUCLEUS PULPOSUS RIGHT Lumbar 1- Lumbar 2 AND RIGHT Lumbar 2-Lumbar 3, EXCISION OF GANGLION CYST RIGHT VOLAR RADIAL WRIST;  Surgeon: Jessy Oto, MD;  Location: [facility];  Service: Orthopedics;  Laterality: Right;   MEDIAL PARTIAL KNEE REPLACEMENT Left    PTCA     ROTATOR CUFF REPAIR     2R; 3 L   stress cardiolite  12/15/05   TRANSTHORACIC ECHOCARDIOGRAM  07/13/06   Social History   Occupational History   Occupation: Disability  Tobacco Use   Smoking status: Never Smoker   Smokeless tobacco: Never Used   Tobacco comment: denies   Substance and Sexual Activity   Alcohol use: Yes    Alcohol/week: 6.0 standard drinks    Types: 6 Shots of liquor per week    Comment: weekends   Drug use: No    Comment: Smoked marijuana on occasion in distant past.    Sexual activity: Not Currently

## 2019-03-12 ENCOUNTER — Encounter (HOSPITAL_COMMUNITY): Payer: Self-pay | Admitting: Cardiology

## 2019-03-12 ENCOUNTER — Ambulatory Visit (HOSPITAL_COMMUNITY)
Admission: RE | Admit: 2019-03-12 | Discharge: 2019-03-12 | Disposition: A | Discharge status: Home / Self Care | Payer: Medicare HMO | Source: Ambulatory Visit | Attending: Cardiology | Admitting: Cardiology

## 2019-03-12 VITALS — BP 126/73 | HR 62 | Wt 183.8 lb

## 2019-03-12 DIAGNOSIS — E785 Hyperlipidemia, unspecified: Secondary | ICD-10-CM

## 2019-03-12 DIAGNOSIS — Z8249 Family history of ischemic heart disease and other diseases of the circulatory system: Secondary | ICD-10-CM | POA: Insufficient documentation

## 2019-03-12 DIAGNOSIS — G4733 Obstructive sleep apnea (adult) (pediatric): Secondary | ICD-10-CM | POA: Insufficient documentation

## 2019-03-12 DIAGNOSIS — K219 Gastro-esophageal reflux disease without esophagitis: Secondary | ICD-10-CM | POA: Insufficient documentation

## 2019-03-12 DIAGNOSIS — I428 Other cardiomyopathies: Secondary | ICD-10-CM | POA: Diagnosis not present

## 2019-03-12 DIAGNOSIS — J209 Acute bronchitis, unspecified: Secondary | ICD-10-CM | POA: Diagnosis not present

## 2019-03-12 DIAGNOSIS — Z823 Family history of stroke: Secondary | ICD-10-CM | POA: Diagnosis not present

## 2019-03-12 DIAGNOSIS — I5022 Chronic systolic (congestive) heart failure: Secondary | ICD-10-CM | POA: Diagnosis not present

## 2019-03-12 DIAGNOSIS — N189 Chronic kidney disease, unspecified: Secondary | ICD-10-CM | POA: Diagnosis not present

## 2019-03-12 DIAGNOSIS — Z7951 Long term (current) use of inhaled steroids: Secondary | ICD-10-CM | POA: Diagnosis not present

## 2019-03-12 DIAGNOSIS — Z8042 Family history of malignant neoplasm of prostate: Secondary | ICD-10-CM | POA: Insufficient documentation

## 2019-03-12 DIAGNOSIS — I130 Hypertensive heart and chronic kidney disease with heart failure and stage 1 through stage 4 chronic kidney disease, or unspecified chronic kidney disease: Secondary | ICD-10-CM | POA: Diagnosis not present

## 2019-03-12 DIAGNOSIS — J00 Acute nasopharyngitis [common cold]: Secondary | ICD-10-CM

## 2019-03-12 DIAGNOSIS — Z79899 Other long term (current) drug therapy: Secondary | ICD-10-CM | POA: Diagnosis not present

## 2019-03-12 DIAGNOSIS — E781 Pure hyperglyceridemia: Secondary | ICD-10-CM | POA: Insufficient documentation

## 2019-03-12 LAB — CBC
HCT: 41.8 % (ref 39.0–52.0)
Hemoglobin: 14 g/dL (ref 13.0–17.0)
MCH: 32.5 pg (ref 26.0–34.0)
MCHC: 33.5 g/dL (ref 30.0–36.0)
MCV: 97 fL (ref 80.0–100.0)
Platelets: 130 10*3/uL — ABNORMAL LOW (ref 150–400)
RBC: 4.31 MIL/uL (ref 4.22–5.81)
RDW: 12.7 % (ref 11.5–15.5)
WBC: 8 10*3/uL (ref 4.0–10.5)
nRBC: 0 % (ref 0.0–0.2)

## 2019-03-12 LAB — BASIC METABOLIC PANEL
Anion gap: 8 (ref 5–15)
BUN: 7 mg/dL — ABNORMAL LOW (ref 8–23)
CO2: 24 mmol/L (ref 22–32)
Calcium: 8.8 mg/dL — ABNORMAL LOW (ref 8.9–10.3)
Chloride: 107 mmol/L (ref 98–111)
Creatinine, Ser: 0.92 mg/dL (ref 0.61–1.24)
GFR calc Af Amer: >60 mL/min (ref >60)
GFR calc non Af Amer: >60 mL/min (ref >60)
Glucose, Bld: 118 mg/dL — ABNORMAL HIGH (ref 70–99)
Potassium: 4.1 mmol/L (ref 3.5–5.1)
Sodium: 139 mmol/L (ref 135–145)

## 2019-03-12 LAB — LIPID PANEL
Cholesterol: 168 mg/dL (ref 0–200)
HDL: 56 mg/dL (ref >40)
LDL Cholesterol: 100 mg/dL — ABNORMAL HIGH (ref 0–99)
Total CHOL/HDL Ratio: 3 RATIO
Triglycerides: 61 mg/dL (ref <150)
VLDL: 12 mg/dL (ref 0–40)

## 2019-03-12 MED ORDER — ROSUVASTATIN CALCIUM 40 MG PO TABS: 40.0000 mg | ORAL_TABLET | Freq: Every day | ORAL | 11 refills | Status: DC

## 2019-03-12 NOTE — Patient Instructions (Signed)
Routine lab work today. Will notify you of abnormal results  Repeat labs in 3 months  Follow up and echo in 6 months (please call our office in March to schedule June appointment)

## 2019-03-13 NOTE — Progress Notes (Signed)
Patient ID: [name] [name], male   DOB: July 05, 1950, 68 y.o.   MRN: [id number] Pulmonary: Dr Elsworth Soho PCP: Lorayne Marek Charlene Brooke, NP Cardiology: Dr. Aundra Dubin  68 y.o. with history of nonischemic cardiomyopathy presents for followup of CHF.  Most recent echo in 10/18 showed improvement in EF to 50-55%. He has continued to do well symptomatically.  Not as active with COVID pandemic (no longer going out dancing on the weekends).  No significant exertional dyspnea.  No orthopnea/PND.  No lightheadedness.  No chest pain.  Main complaint is shoulder pain from a torn rotator cuff and sciatica. He is drinking up to 3-4 beers/day now.   ECG (personally reviewed): NSR, RBBB  Labs (12/09): SPEP negative, HIV negative, transferrin saturation 29%, BNP 24 Labs (9/10): direct LDL 54, HDL 13.8, triglycerides 1470, creatinine 0.8 Labs (05/11/09): BNP 27, WBCs 5.6 Labs (5/11): K 4.6, creatinine 1.2, TGs 238, LDL 108, HDL 34 Labs (8/11): K 4.3, creatinine 1.4, BNP 12.6 Labs (2/12): K 4.1, creatinine 1.7, LDL 102 Labs (8/12): K 4.7, creatinine 1.5 Labs (4/13): LDL 132, HDL 33.4, AST 41, ALT 63 Labs (7/13): K 4, creatinine 1.2, ALT 59, AST 36, HCV negative Labs (1/14): K 4.1, creatinine 1.5 Labs (04/24/14): Cholesterol 222, TGL 265, HDL 37, LDL 169 Labs (4/16): LDL 110, HDl 38 Labs (9/16): LDL 116, HDL 36, TGs 175 Labs (01/2015): K 3.8 Creatinine 1.2 => 1.08, HCT 37 Labs (3/17): creatinine 1.07, BNP 8.9 Labs (6/17): K 4.2, creatinine 1.0 Labs (9/18): TGs 215, LDL 106, HDL 40 Labs (12/18): K 4.4, creatinine 1.09 Labs (12/19): LDL 149  Allergies (verified):  No Known Drug Allergies  Past Medical History: 1. Nonischemic cardiomyopathy.  The patient had a left heart catheterization done in March 2004 with normal coronaries and then in November 2008, he had a Myoview done that showed an EF of 39% with no ischemia.  Echo in April 2008 showed an EF of 35-40%, moderate diffuse hypokinesis, mild left atrial enlargement,  normal RV size and function and essentially normal valves.  The cause of his cardiomyopathy has not been discovered. He drinks but not extremely heavily.  He has never used cocaine and amphetamines.  HIV, SPEP, and ferritin/Fe studies were all negative in 12/09.  Echo (9/10): EF 40%, mild to moderate global HK, mild LAE.  Myoview (5/11): EF 42% with apical thinning but no evidence for ischemia or infarction.  Echo (3/12): EF 45-50%, no significant valvular abnormalities. Echo (1/16) with EF 35-40%, diffuse hypokinesis.  - Cardiac MRI (3/17) with EF 43%, mild diffuse hypokinesis, mid-wall LGE in the mid septum, possible prior viral myocarditis.  - Echo (10/18): EF 50-55%, normal RV size and systolic function.  2. Hypertriglyceridemia 3. Rotator cuff tendonitis with a history of bilateral shoulder surgery. 4. Hypertension. 5. History of a back surgery. 6. Knee osteoarthritis, s/p arthroscopy 2010 7. Gastroesophageal reflux disease. EGD (1/12) with erosive esophagitis and gastritis, biopsy positive for H pylori (being treated). 8. Colonoscopy (1/12) with mild diverticulosis and hemorrhoids.  9. Allergic rhinitis 10. Gynecomastia with spironolactone (ok on eplerenone).  11. Probable mild intermittent asthma 12. Elevated LFTs: HCV negative, possibly due to ETOH 13. CKD 14. OSA: CPAP.  15. H/o herniated nucleus pulposus: s/p surgery 6/17.  Family History: No premature CAD.  Father with prostate cancer Mother with CVA Brother with nonischemic cardiomyopathy, has ICD Grandmother with cancer (unsure what)    Social History: The patient denies smoking.  Lives in [place].  He does not use any drugs. He has  never used any illicit drugs other than marijuana, which he smoked occasionally in the distant past.  Moderate ETOH.He is on disability. He is divorced with two sons.     ROS:  All systems reviewed and negative except as per HPI.    Current Outpatient Medications  Medication Sig Dispense  Refill  . albuterol (PROVENTIL HFA;VENTOLIN HFA) 108 (90 Base) MCG/ACT inhaler Inhale 1-2 puffs into the lungs every 6 (six) hours as needed. 1 Inhaler 0  . ALPRAZolam (XANAX) 0.5 MG tablet     . carvedilol (COREG) 12.5 MG tablet Take 1 tablet (12.5 mg total) by mouth 2 (two) times daily. 180 tablet 0  . ENTRESTO 24-26 MG Take 1 tablet by mouth twice daily 180 tablet 0  . eplerenone (INSPRA) 25 MG tablet Take 1 tablet (25 mg total) by mouth daily. 90 tablet 3  . EQ ALLERGY RELIEF, CETIRIZINE, 10 MG tablet Take 1 tablet by mouth once daily 30 tablet 5  . fluticasone (FLONASE) 50 MCG/ACT nasal spray Place 2 sprays into both nostrils daily. 16 g 0  . HYDROcodone-acetaminophen (NORCO) 5-325 MG tablet Take 1 tablet by mouth every 6 (six) hours as needed. 30 tablet 0  . LYRICA 75 MG capsule Take 1 capsule by mouth two times daily. 180 capsule 1  . pantoprazole (PROTONIX) 40 MG tablet Take 1 tablet (40 mg total) by mouth daily. 90 tablet 3  . rosuvastatin (CRESTOR) 40 MG tablet Take 1 tablet (40 mg total) by mouth daily. 30 tablet 11  . sodium chloride (OCEAN) 0.65 % SOLN nasal spray Place 1 spray into both nostrils as needed for congestion. 15 mL 0   Current Facility-Administered Medications  Medication Dose Route Frequency Provider Last Rate Last Dose  . betamethasone acetate-betamethasone sodium phosphate (CELESTONE) injection 12 mg  12 mg Other Once Magnus Sinning, MD        BP 126/73   Pulse 62   Wt 83.4 kg (183 lb 12.8 oz)   SpO2 98%   BMI 28.79 kg/m  General: NAD Neck: No JVD, no thyromegaly or thyroid nodule.  Lungs: Clear to auscultation bilaterally with normal respiratory effort. CV: Nondisplaced PMI.  Heart regular S1/S2, no S3/S4, no murmur.  No peripheral edema.  No carotid bruit.  Normal pedal pulses.  Abdomen: Soft, nontender, no hepatosplenomegaly, no distention.  Skin: Intact without lesions or rashes.  Neurologic: Alert and oriented x 3.  Psych: Normal  affect. Extremities: No clubbing or cyanosis.  HEENT: Normal.   Assessment/Plan:  1. Chronic systolic heart failure: Nonischemic cardiomyopathy, ECHO 04/2014 with EF down to 35-40%.  Cardiac MRI in 3/17, however, showed EF 43% with mid-wall pattern of LGE in the septum that may be suggestive of prior myocarditis.  Echo in 10/18 showed EF up to 50-55. No heavy substance abuse (though occasionally binge drinks), HIV negative, SPEP negative, no evidence for hemochromatosis. Would also consider familial cardiomyopathy as his brother apparently also has a nonischemic cardiomyopathy and got an ICD.  NYHA I-II. Volume status stable. Not currently on lasix. Of note, he has been drinking more ETOH recently.  - I asked him to cut back on ETOH intake.  - Continue Inspra 25 mg daily.  - Continue carvedilol 12.5 mg twice a day.  - Continue Entresto 24/26 bid.  - BMET today and will need BMET every 3 months on his current medication regimen.  - I will arrange for repeat echo.  2. Hyperlipidemia: He is on Crestor, check lipids today.  3. OSA:  Unable to tolerate CPAP.   Followup in 6 months.   Loralie Champagne 03/13/2019

## 2019-03-19 ENCOUNTER — Telehealth (HOSPITAL_COMMUNITY): Payer: Self-pay | Admitting: Pharmacist

## 2019-03-19 NOTE — Telephone Encounter (Signed)
Sent in [employer] application to [employer] for [employer].   Phone: [phone] Fax: [phone]  Application pending, will continue to follow.

## 2019-03-20 ENCOUNTER — Other Ambulatory Visit: Payer: Self-pay | Admitting: Specialist

## 2019-03-21 NOTE — Telephone Encounter (Signed)
Can you advise or hold for Nitka?

## 2019-03-24 ENCOUNTER — Ambulatory Visit (HOSPITAL_COMMUNITY)
Admission: RE | Admit: 2019-03-24 | Discharge: 2019-03-24 | Disposition: A | Discharge status: Home / Self Care | Payer: Medicare HMO | Source: Ambulatory Visit | Attending: Cardiology | Admitting: Cardiology

## 2019-03-24 ENCOUNTER — Other Ambulatory Visit: Payer: Self-pay

## 2019-03-24 DIAGNOSIS — I071 Rheumatic tricuspid insufficiency: Secondary | ICD-10-CM | POA: Diagnosis not present

## 2019-03-24 DIAGNOSIS — G4730 Sleep apnea, unspecified: Secondary | ICD-10-CM | POA: Diagnosis not present

## 2019-03-24 DIAGNOSIS — F1020 Alcohol dependence, uncomplicated: Secondary | ICD-10-CM | POA: Diagnosis not present

## 2019-03-24 DIAGNOSIS — I5022 Chronic systolic (congestive) heart failure: Secondary | ICD-10-CM | POA: Diagnosis not present

## 2019-03-24 DIAGNOSIS — I509 Heart failure, unspecified: Secondary | ICD-10-CM | POA: Diagnosis not present

## 2019-03-24 DIAGNOSIS — I110 Hypertensive heart disease with heart failure: Secondary | ICD-10-CM | POA: Insufficient documentation

## 2019-03-24 DIAGNOSIS — E785 Hyperlipidemia, unspecified: Secondary | ICD-10-CM | POA: Insufficient documentation

## 2019-03-24 DIAGNOSIS — I428 Other cardiomyopathies: Secondary | ICD-10-CM | POA: Diagnosis not present

## 2019-03-24 NOTE — Progress Notes (Signed)
  Echocardiogram 2D Echocardiogram has been performed.  [name] [name] 03/24/2019, 11:35 AM

## 2019-03-25 ENCOUNTER — Telehealth (HOSPITAL_COMMUNITY): Payer: Self-pay

## 2019-03-25 MED ORDER — ENTRESTO 49-51 MG PO TABS: 1.0000 | ORAL_TABLET | Freq: Two times a day (BID) | ORAL | 5 refills | Status: DC

## 2019-03-25 NOTE — Telephone Encounter (Signed)
-----   Message from Larey Dresser, MD sent at 03/25/2019 12:56 AM EST ----- EF is lower, 40-45%.  Have him increase Entresto to 49/51 bid with BMET in 10 days and move followup to 1 month with pharmacy for med titration and 2 months with me.

## 2019-03-27 ENCOUNTER — Telehealth: Payer: Self-pay | Admitting: Family

## 2019-03-27 NOTE — Telephone Encounter (Signed)
Please advise of message below, thank you.

## 2019-03-27 NOTE — Telephone Encounter (Signed)
Cant refill

## 2019-03-27 NOTE — Telephone Encounter (Signed)
Patient called. He is requesting a refill on his hydrocodone to help hold his shoulder pain until his appointment on the 29th.  Call back number: [phone]

## 2019-03-31 ENCOUNTER — Telehealth (HOSPITAL_COMMUNITY): Payer: Self-pay | Admitting: Pharmacy Technician

## 2019-03-31 ENCOUNTER — Telehealth (HOSPITAL_COMMUNITY): Payer: Self-pay | Admitting: Pharmacist

## 2019-03-31 DIAGNOSIS — J00 Acute nasopharyngitis [common cold]: Secondary | ICD-10-CM

## 2019-03-31 DIAGNOSIS — J209 Acute bronchitis, unspecified: Secondary | ICD-10-CM

## 2019-03-31 MED ORDER — ALBUTEROL SULFATE HFA 108 (90 BASE) MCG/ACT IN AERS: 1.0000 | INHALATION_SPRAY | Freq: Four times a day (QID) | RESPIRATORY_TRACT | 11 refills | Status: DC | PRN

## 2019-03-31 NOTE — Telephone Encounter (Signed)
Received message from patient that a prescription for an albuterol inhaler needed to be sent to [facility] to be filled through their patient assistance program.  However, [employer] does not make an albuterol inhaler, so he will not be able to receive this from them. Ventolin is the preferred albuterol inhaler product for his insurance. Sent in prescription for Ventolin to [facility].   Audry Riles, PharmD, BCPS, BCCP, CPP [facility] Pharmacist [phone]

## 2019-03-31 NOTE — Telephone Encounter (Signed)
[employer] requested insurance card front and back for patient assistance. Those were sent on 12/17.  Charlann Boxer, CPhT

## 2019-04-01 ENCOUNTER — Encounter: Payer: Self-pay | Admitting: Physical Medicine and Rehabilitation

## 2019-04-01 ENCOUNTER — Other Ambulatory Visit: Payer: Self-pay

## 2019-04-01 ENCOUNTER — Ambulatory Visit (INDEPENDENT_AMBULATORY_CARE_PROVIDER_SITE_OTHER): Payer: Medicare HMO | Admitting: Physical Medicine and Rehabilitation

## 2019-04-01 ENCOUNTER — Ambulatory Visit: Payer: Self-pay

## 2019-04-01 VITALS — BP 135/78 | HR 65

## 2019-04-01 DIAGNOSIS — M5416 Radiculopathy, lumbar region: Secondary | ICD-10-CM | POA: Diagnosis not present

## 2019-04-01 MED ORDER — BETAMETHASONE SOD PHOS & ACET 6 (3-3) MG/ML IJ SUSP
12.0000 mg | Freq: Once | INTRAMUSCULAR | Status: AC
  Administered 2019-04-01: 14:00:00 12 mg

## 2019-04-01 NOTE — Procedures (Signed)
Lumbosacral Transforaminal Epidural Steroid Injection - Sub-Pedicular Approach with Fluoroscopic Guidance  Patient: [name] [name]      Date of Birth: 1950-06-14 MRN: [id number] PCP: Imagene Riches, NP      Visit Date: 04/01/2019   Universal Protocol:    Date/Time: 04/01/2019  Consent Given By: the patient  Position: PRONE  Additional Comments: Vital signs were monitored before and after the procedure. Patient was prepped and draped in the usual sterile fashion. The correct patient, procedure, and site was verified.   Injection Procedure Details:  Procedure Site One Meds Administered:  Meds ordered this encounter  Medications  . betamethasone acetate-betamethasone sodium phosphate (CELESTONE) injection 12 mg    Laterality: Left  Location/Site:  L5-S1  Needle size: 22 G  Needle type: Spinal  Needle Placement: Transforaminal  Findings:    -Comments: Excellent flow of contrast along the nerve and into the epidural space.  Procedure Details: After squaring off the end-plates to get a true AP view, the C-arm was positioned so that an oblique view of the foramen as noted above was visualized. The target area is just inferior to the "nose of the scotty dog" or sub pedicular. The soft tissues overlying this structure were infiltrated with 2-3 ml. of 1% Lidocaine without Epinephrine.  The spinal needle was inserted toward the target using a "trajectory" view along the fluoroscope beam.  Under AP and lateral visualization, the needle was advanced so it did not puncture dura and was located close the 6 O'Clock position of the pedical in AP tracterory. Biplanar projections were used to confirm position. Aspiration was confirmed to be negative for CSF and/or blood. A 1-2 ml. volume of Isovue-250 was injected and flow of contrast was noted at each level. Radiographs were obtained for documentation purposes.   After attaining the desired flow of contrast documented above, a 0.5 to  1.0 ml test dose of 0.25% Marcaine was injected into each respective transforaminal space.  The patient was observed for 90 seconds post injection.  After no sensory deficits were reported, and normal lower extremity motor function was noted,   the above injectate was administered so that equal amounts of the injectate were placed at each foramen (level) into the transforaminal epidural space.   Additional Comments:  The patient tolerated the procedure well Dressing: 2 x 2 sterile gauze and Band-Aid    Post-procedure details: Patient was observed during the procedure. Post-procedure instructions were reviewed.  Patient left the clinic in stable condition.

## 2019-04-01 NOTE — Progress Notes (Signed)
 .  Numeric Pain Rating Scale and Functional Assessment Average Pain 6   In the last MONTH (on 0-10 scale) has pain interfered with the following?  1. General activity like being  able to carry out your everyday physical activities such as walking, climbing stairs, carrying groceries, or moving a chair?  Rating(7)   +Driver, -BT, -Dye Allergies.  

## 2019-04-01 NOTE — Progress Notes (Signed)
[name] [name] - 68 y.o. male MRN [id number]  Date of birth: March 16, 1951  Office Visit Note: Visit Date: 04/01/2019 PCP: Imagene Riches, NP Referred by: Flossie Buffy, NP  Subjective: Chief Complaint  Patient presents with  . Lower Back - Pain  . Left Leg - Pain   HPI:  [name] [name] is a 68 y.o. male who comes in today For planned transforaminal epidural steroid injection for left radiculitis radiculopathy.  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 Jeneen Rinks neck.  Prior injection a few months ago on the left at L4 gave him some relief temporarily of some of his leg pain but not so much of his back pain.  He continues to follow with Dondra Prader, FN-P as well as Dr. Sharol Given for his shoulder.  He does have an appointment set up for Dr. Louanne Skye in the future.  Today based on his symptoms a look at an L5 transforaminal injection.  He probably is looking at potential lumbar laminectomy at higher levels.  ROS Otherwise per HPI.  Assessment & Plan: Visit Diagnoses:  1. Lumbar radiculopathy     Plan: No additional findings.   Meds & Orders:  Meds ordered this encounter  Medications  . betamethasone acetate-betamethasone sodium phosphate (CELESTONE) injection 12 mg    Orders Placed This Encounter  Procedures  . XR C-ARM NO REPORT  . Epidural Steroid injection    Follow-up: Return for Basil Dess, MD as scheduled.   Procedures: No procedures performed  Lumbosacral Transforaminal Epidural Steroid Injection - Sub-Pedicular Approach with Fluoroscopic Guidance  Patient: [name] [name]      Date of Birth: 09/24/1950 MRN: [id number] PCP: Imagene Riches, NP      Visit Date: 04/01/2019   Universal Protocol:    Date/Time: 04/01/2019  Consent Given By: the patient  Position: PRONE  Additional Comments: Vital signs were monitored before and after the  procedure. Patient was prepped and draped in the usual sterile fashion. The correct patient, procedure, and site was verified.   Injection Procedure Details:  Procedure Site One Meds Administered:  Meds ordered this encounter  Medications  . betamethasone acetate-betamethasone sodium phosphate (CELESTONE) injection 12 mg    Laterality: Left  Location/Site:  L5-S1  Needle size: 22 G  Needle type: Spinal  Needle Placement: Transforaminal  Findings:    -Comments: Excellent flow of contrast along the nerve and into the epidural space.  Procedure Details: After squaring off the end-plates to get a true AP view, the C-arm was positioned so that an oblique view of the foramen as noted above was visualized. The target area is just inferior to the "nose of the scotty dog" or sub pedicular. The soft tissues overlying this structure were infiltrated with 2-3 ml. of 1% Lidocaine without Epinephrine.  The spinal needle was inserted toward the target using a "trajectory" view along the fluoroscope beam.  Under AP and lateral visualization, the needle was advanced so it did not puncture dura and was located close the 6 O'Clock position of the pedical in AP tracterory. Biplanar projections were used to confirm position. Aspiration was confirmed to be negative for CSF and/or blood. A 1-2 ml. volume of Isovue-250 was injected and flow of contrast was noted at each level. Radiographs were obtained for documentation purposes.   After attaining the desired flow of contrast documented above, a 0.5 to 1.0 ml test dose of  0.25% Marcaine was injected into each respective transforaminal space.  The patient was observed for 90 seconds post injection.  After no sensory deficits were reported, and normal lower extremity motor function was noted,   the above injectate was administered so that equal amounts of the injectate were placed at each foramen (level) into the transforaminal epidural space.   Additional  Comments:  The patient tolerated the procedure well Dressing: 2 x 2 sterile gauze and Band-Aid    Post-procedure details: Patient was observed during the procedure. Post-procedure instructions were reviewed.  Patient left the clinic in stable condition.      Clinical History: No specialty comments available.     Objective:  VS:  HT:    WT:   BMI:     BP:135/78  HR:65bpm  TEMP: ( )  RESP:  Physical Exam  Ortho Exam Imaging: No results found.

## 2019-04-01 NOTE — Telephone Encounter (Signed)
Advanced Heart Failure Patient Advocate Encounter   Patient was approved to receive Inspra (eplerenone) from [employer].   Patient ID: [id number] Effective dates: 04/11/2019 through 04/09/2020  Audry Riles, PharmD, BCPS, BCCP, CPP Heart Failure Clinic Pharmacist [phone]

## 2019-04-01 NOTE — Progress Notes (Signed)
[name] [name] - 67 y.o. male MRN [id number]  Date of birth: 1951-03-03  Office Visit Note: Visit Date: 02/17/2019 PCP: Imagene Riches, NP Referred by: Flossie Buffy, NP  Subjective: Chief Complaint  Patient presents with   Lower Back - Pain   Left Thigh - Pain   Left Hip - Pain   HPI:  [name] [name] is a 68 y.o. male who comes in today For planned left L4 transforaminal epidural steroid injection.  Patient follow-up Dondra Prader, FN-P and has failed conservative care and work-up for lumbar spine and left radicular leg pain.  Symptoms are more L4 and S1 some symptoms into the thigh some symptoms back of the leg.  He has had prior lumbar laminectomy decompression at L5-S1 by Dr. Basil Dess.  He has new MRI findings of severe stenosis at L3-4 and L4-5 with disc herniation at L4-5.  He has lateral recess narrowing on the left at L5-S1.  Multilevel findings with a left radicular leg pain.  We will try diagnostic left L4 transforaminal injection.  ROS Otherwise per HPI.  Assessment & Plan: Visit Diagnoses:  1. Lumbar radiculopathy   2. Spinal stenosis of lumbar region with neurogenic claudication   3. Post laminectomy syndrome   4. Spondylosis without myelopathy or radiculopathy, lumbar region     Plan: No additional findings.   Meds & Orders:  Meds ordered this encounter  Medications   betamethasone acetate-betamethasone sodium phosphate (CELESTONE) injection 12 mg    Orders Placed This Encounter  Procedures   XR C-ARM NO REPORT   Epidural Steroid injection    Follow-up: Return for Meridee Score, MD for review shoulder MRI and shoulder pain..   Procedures: No procedures performed  Lumbosacral Transforaminal Epidural Steroid Injection - Sub-Pedicular Approach with Fluoroscopic Guidance  Patient: [name] [name]      Date of Birth: 1950-11-15 MRN: [id number] PCP: Imagene Riches, NP      Visit Date: 02/17/2019   Universal Protocol:    Date/Time:  02/17/2019  Consent Given By: the patient  Position: PRONE  Additional Comments: Vital signs were monitored before and after the procedure. Patient was prepped and draped in the usual sterile fashion. The correct patient, procedure, and site was verified.   Injection Procedure Details:  Procedure Site One Meds Administered:  Meds ordered this encounter  Medications   betamethasone acetate-betamethasone sodium phosphate (CELESTONE) injection 12 mg    Laterality: Left  Location/Site:  L4-L5  Needle size: 38 G  Needle type: Spinal  Needle Placement: Transforaminal  Findings:    -Comments: Excellent flow of contrast along the nerve and into the epidural space.  Procedure Details: After squaring off the end-plates to get a true AP view, the C-arm was positioned so that an oblique view of the foramen as noted above was visualized. The target area is just inferior to the "nose of the scotty dog" or sub pedicular. The soft tissues overlying this structure were infiltrated with 2-3 ml. of 1% Lidocaine without Epinephrine.  The spinal needle was inserted toward the target using a "trajectory" view along the fluoroscope beam.  Under AP and lateral visualization, the needle was advanced so it did not puncture dura and was located close the 6 O'Clock position of the pedical in AP tracterory. Biplanar projections were used to confirm position. Aspiration was confirmed to be negative for CSF and/or blood. A 1-2 ml. volume of Isovue-250 was injected and flow of contrast was noted at each level. Radiographs  were obtained for documentation purposes.   After attaining the desired flow of contrast documented above, a 0.5 to 1.0 ml test dose of 0.25% Marcaine was injected into each respective transforaminal space.  The patient was observed for 90 seconds post injection.  After no sensory deficits were reported, and normal lower extremity motor function was noted,   the above injectate was  administered so that equal amounts of the injectate were placed at each foramen (level) into the transforaminal epidural space.   Additional Comments:  The patient tolerated the procedure well Dressing: 2 x 2 sterile gauze and Band-Aid    Post-procedure details: Patient was observed during the procedure. Post-procedure instructions were reviewed.  Patient left the clinic in stable condition.      Clinical History: No specialty comments available.     Objective:  VS:  HT:     WT:    BMI:      BP:112/75   HR:66bpm   TEMP: ( )   RESP:  Physical Exam  Ortho Exam Imaging: Epidural Steroid injection  Result Date: 04/01/2019 Magnus Sinning, MD     04/01/2019  2:27 PM Lumbosacral Transforaminal Epidural Steroid Injection - Sub-Pedicular Approach with Fluoroscopic Guidance Patient: [name] [name]     Date of Birth: May 24, 1950 MRN: [id number] PCP: Imagene Riches, NP     Visit Date: 04/01/2019  Universal Protocol:   Date/Time: 04/01/2019 Consent Given By: the patient Position: PRONE Additional Comments: Vital signs were monitored before and after the procedure. Patient was prepped and draped in the usual sterile fashion. The correct patient, procedure, and site was verified. Injection Procedure Details: Procedure Site One Meds Administered: Meds ordered this encounter Medications  betamethasone acetate-betamethasone sodium phosphate (CELESTONE) injection 12 mg Laterality: Left Location/Site: L5-S1 Needle size: 22 G Needle type: Spinal Needle Placement: Transforaminal Findings:   -Comments: Excellent flow of contrast along the nerve and into the epidural space. Procedure Details: After squaring off the end-plates to get a true AP view, the C-arm was positioned so that an oblique view of the foramen as noted above was visualized. The target area is just inferior to the "nose of the scotty dog" or sub pedicular. The soft tissues overlying this structure were infiltrated with 2-3 ml. of 1% Lidocaine  without Epinephrine. The spinal needle was inserted toward the target using a "trajectory" view along the fluoroscope beam.  Under AP and lateral visualization, the needle was advanced so it did not puncture dura and was located close the 6 O'Clock position of the pedical in AP tracterory. Biplanar projections were used to confirm position. Aspiration was confirmed to be negative for CSF and/or blood. A 1-2 ml. volume of Isovue-250 was injected and flow of contrast was noted at each level. Radiographs were obtained for documentation purposes. After attaining the desired flow of contrast documented above, a 0.5 to 1.0 ml test dose of 0.25% Marcaine was injected into each respective transforaminal space.  The patient was observed for 90 seconds post injection.  After no sensory deficits were reported, and normal lower extremity motor function was noted,   the above injectate was administered so that equal amounts of the injectate were placed at each foramen (level) into the transforaminal epidural space. Additional Comments: The patient tolerated the procedure well Dressing: 2 x 2 sterile gauze and Band-Aid  Post-procedure details: Patient was observed during the procedure. Post-procedure instructions were reviewed. Patient left the clinic in stable condition.

## 2019-04-01 NOTE — Procedures (Signed)
Lumbosacral Transforaminal Epidural Steroid Injection - Sub-Pedicular Approach with Fluoroscopic Guidance  Patient: [name] [name]      Date of Birth: Apr 25, 1950 MRN: [id number] PCP: Imagene Riches, NP      Visit Date: 02/17/2019   Universal Protocol:    Date/Time: 02/17/2019  Consent Given By: the patient  Position: PRONE  Additional Comments: Vital signs were monitored before and after the procedure. Patient was prepped and draped in the usual sterile fashion. The correct patient, procedure, and site was verified.   Injection Procedure Details:  Procedure Site One Meds Administered:  Meds ordered this encounter  Medications  . betamethasone acetate-betamethasone sodium phosphate (CELESTONE) injection 12 mg    Laterality: Left  Location/Site:  L4-L5  Needle size: 22 G  Needle type: Spinal  Needle Placement: Transforaminal  Findings:    -Comments: Excellent flow of contrast along the nerve and into the epidural space.  Procedure Details: After squaring off the end-plates to get a true AP view, the C-arm was positioned so that an oblique view of the foramen as noted above was visualized. The target area is just inferior to the "nose of the scotty dog" or sub pedicular. The soft tissues overlying this structure were infiltrated with 2-3 ml. of 1% Lidocaine without Epinephrine.  The spinal needle was inserted toward the target using a "trajectory" view along the fluoroscope beam.  Under AP and lateral visualization, the needle was advanced so it did not puncture dura and was located close the 6 O'Clock position of the pedical in AP tracterory. Biplanar projections were used to confirm position. Aspiration was confirmed to be negative for CSF and/or blood. A 1-2 ml. volume of Isovue-250 was injected and flow of contrast was noted at each level. Radiographs were obtained for documentation purposes.   After attaining the desired flow of contrast documented above, a 0.5 to  1.0 ml test dose of 0.25% Marcaine was injected into each respective transforaminal space.  The patient was observed for 90 seconds post injection.  After no sensory deficits were reported, and normal lower extremity motor function was noted,   the above injectate was administered so that equal amounts of the injectate were placed at each foramen (level) into the transforaminal epidural space.   Additional Comments:  The patient tolerated the procedure well Dressing: 2 x 2 sterile gauze and Band-Aid    Post-procedure details: Patient was observed during the procedure. Post-procedure instructions were reviewed.  Patient left the clinic in stable condition.

## 2019-04-07 ENCOUNTER — Other Ambulatory Visit (HOSPITAL_COMMUNITY): Payer: Medicare HMO

## 2019-04-08 ENCOUNTER — Ambulatory Visit: Payer: Medicare HMO | Admitting: Family

## 2019-04-10 ENCOUNTER — Other Ambulatory Visit: Payer: Self-pay | Admitting: Radiology

## 2019-04-10 MED ORDER — PREGABALIN 75 MG PO CAPS: 75.0000 mg | ORAL_CAPSULE | Freq: Two times a day (BID) | ORAL | 3 refills | Status: DC

## 2019-04-18 ENCOUNTER — Telehealth (HOSPITAL_COMMUNITY): Payer: Self-pay | Admitting: Licensed Clinical Social Worker

## 2019-04-18 NOTE — Telephone Encounter (Signed)
Pt had been previously enrolled with [employer] for [employer] but it expired in December.  PAN foundation now open and pt agreeable to applying- pt approved  Member ID: [id number] Group ID: [id number] RxBin ID: [id number] PCN: [id number] Eligibility Start Date: 04/18/2019 Eligibility End Date: 04/16/2020 Assistance Amount: $1,000.00  Pt still has PAN foundation card from previous enrollment- all the information is the same so he will provide to [employer] Desk#: [phone] Cell#: [phone]

## 2019-04-23 ENCOUNTER — Ambulatory Visit: Payer: Medicare HMO | Admitting: Specialist

## 2019-04-23 ENCOUNTER — Encounter: Payer: Self-pay | Admitting: Specialist

## 2019-04-23 ENCOUNTER — Other Ambulatory Visit: Payer: Self-pay

## 2019-04-23 VITALS — BP 134/85 | HR 72 | Ht 67.0 in | Wt 175.0 lb

## 2019-04-23 DIAGNOSIS — I5022 Chronic systolic (congestive) heart failure: Secondary | ICD-10-CM

## 2019-04-23 DIAGNOSIS — M5116 Intervertebral disc disorders with radiculopathy, lumbar region: Secondary | ICD-10-CM

## 2019-04-23 DIAGNOSIS — G4733 Obstructive sleep apnea (adult) (pediatric): Secondary | ICD-10-CM

## 2019-04-23 DIAGNOSIS — I10 Essential (primary) hypertension: Secondary | ICD-10-CM | POA: Diagnosis not present

## 2019-04-23 MED ORDER — HYDROCODONE-ACETAMINOPHEN 7.5-325 MG PO TABS: 1.0000 | ORAL_TABLET | Freq: Four times a day (QID) | ORAL | 0 refills | Status: DC | PRN

## 2019-04-23 NOTE — Patient Instructions (Signed)
Avoid frequent bending and stooping  No lifting greater than 10 lbs. May use ice or moist heat for pain. Weight loss is of benefit. Best medication for lumbar disc disease is arthritis medications and you should not take them as they can worsen CHF. Exercise is important to improve your indurance and does allow people to function better inspite of back pain.  Referral to [facility]. Hydrocodone for pain. Refer to your cardiologist and primary care MD concerning the risk of undergoing surgery

## 2019-04-23 NOTE — Progress Notes (Signed)
Office Visit Note   Patient: [name] [name]           Date of Birth: 1950/07/24           MRN: [id number] Visit Date: 04/23/2019              Requested by: Flossie Buffy, NP [place],  [place] [address] PCP: Imagene Riches, NP   Assessment & Plan: Visit Diagnoses:  1. Lumbar disc herniation with radiculopathy   2. Chronic systolic heart failure ([place])   3. OSA (obstructive sleep apnea)   4. Essential hypertension     Plan: Avoid frequent bending and stooping  No lifting greater than 10 lbs. May use ice or moist heat for pain. Weight loss is of benefit. Best medication for lumbar disc disease is arthritis medications and you should not take them as they can worsen CHF. Exercise is important to improve your indurance and does allow people to function better inspite of back pain.  Referral to [facility]. Hydrocodone for pain. Refer to your cardiologist and primary care MD concerning the risk of undergoing surgery  Follow-Up Instructions: No follow-ups on file.   Orders:  Orders Placed This Encounter  Procedures  . Ambulatory referral to Pain Clinic   Meds ordered this encounter  Medications  . HYDROcodone-acetaminophen (NORCO) 7.5-325 MG tablet    Sig: Take 1 tablet by mouth every 6 (six) hours as needed for moderate pain.    Dispense:  30 tablet    Refill:  0      Procedures: No procedures performed   Clinical Data: Findings:  CLINICAL DATA:  Left low back pain for 2 months. Spine surgery in 2017.  EXAM: MRI LUMBAR SPINE WITHOUT CONTRAST  TECHNIQUE: Multiplanar, multisequence MR imaging of the lumbar spine was performed. No intravenous contrast was administered.  COMPARISON:  Lumbar spine radiograph 01/08/2019 and lumbar MRI from 05/03/2015  FINDINGS: Segmentation: The lowest lumbar type non-rib-bearing vertebra is labeled as L5.  Alignment: 3 mm degenerative retrolisthesis at L2-3 and 4 mm degenerative  retrolisthesis at L5-S1.  Vertebrae: 1.5 cm hemangioma eccentric to the right of the L1 vertebral body. Prominent type 2 degenerative endplate findings eccentric to the right at L2-3 the. Mild degenerative facet edema on the right at L4-5.  Disc desiccation and loss of disc height especially at L2-3 and L5-S1. Prominent epidural adipose tissue and congenitally short pedicles in the lumbar spine.  Conus medullaris and cauda equina: Conus extends to the L1 level. Conus and cauda equina appear normal.  Paraspinal and other soft tissues: Unremarkable  Disc levels:  L1-2: Mild displacement of the right L1 nerve in the lateral extraforaminal space due to disc bulge and right lateral extraforaminal disc protrusion. Borderline central narrowing of the thecal sac.  L2-3: Mild displacement of the right L2 nerve in the lateral extraforaminal space due to intervertebral spurring. Borderline central narrowing of the thecal sac mild disc bulge. Impingement at this level is slightly improved compared to the prior exam.  L3-4: Moderate to prominent central narrowing of the thecal sac with displacement of the L3 nerves in the lateral extraforaminal space, right greater than left, due to disc bulge, right foraminal and lateral extraforaminal disc protrusion, short pedicles, and degenerative facet arthropathy. Similar impingement to 05/03/2015.  L4-5: Prominent central narrowing of the thecal sac with mild bilateral foraminal stenosis and moderate left subarticular lateral recess stenosis due to disc bulge, left paracentral disc protrusion extending into the neural  foramen, intervertebral spurring, short pedicles, and facet arthropathy. Cross-sectional area of the thecal sac 0.4 cm^2. Impingement has worsened compared to 05/03/2015.  L5-S1: Moderate left subarticular lateral recess stenosis with mild left foraminal stenosis due to intervertebral spurring, left lateral recess disc  protrusion, and facet arthropathy. Prior left laminectomy noted. Degree of impingement similar to 05/03/15.  IMPRESSION: 1. Lumbar spondylosis and degenerative disc disease, causing prominent impingement at L4-5; moderate to prominent impingement at L3-4; moderate impingement at L5-S1; and mild impingement at L1-2 and L2-3, as detailed above. The impingement is worsened at the L4-5 level.   Electronically Signed   By: Van Clines M.D.   On: 02/10/2019 16:24     Subjective: Chief Complaint  Patient presents with  . Lower Back - Follow-up    29 yeaar old male with history of previous right extraforamenal HNP L1-2 and right L3-4 HNP. He has been having pain in the left leg lateral thigh and left lateral calf to the ankle. No bowel or bladder difficulty. No prostate issues. He is having treatment of ptyerigium of the eye and is to have a procedure in [facility]. He is on entresto for CHF and his heart function has decreased to 45% from 54%. He takes baby aspirin nightly. Taking coreg, enspar, and carvedilol. Left knee partial replacement, left shoulder 3 rotator cuff tear, right with current  Rotator cuff tear. He is having low back pain and radiating down the left lateral leg starts into the left lateral buttock and lateral hip area and radiates down the left leg. Feels like a cramp is staring lef leg    Review of Systems  Constitutional: Negative.  Negative for activity change, appetite change, chills, diaphoresis, fatigue, fever and unexpected weight change.  HENT: Positive for dental problem and sore throat. Negative for congestion, drooling, ear discharge, ear pain, facial swelling, hearing loss, mouth sores, nosebleeds, postnasal drip, rhinorrhea, sinus pressure, sinus pain, sneezing, tinnitus, trouble swallowing and voice change.   Eyes: Positive for visual disturbance. Negative for photophobia, pain, discharge, redness and itching.  Respiratory: Negative.  Negative  for apnea, cough, choking, chest tightness, shortness of breath, wheezing and stridor.   Cardiovascular: Negative.  Negative for chest pain, palpitations and leg swelling.  Gastrointestinal: Negative.  Negative for abdominal distention, abdominal pain, anal bleeding, blood in stool, constipation, diarrhea, nausea, rectal pain and vomiting.  Endocrine: Negative for cold intolerance, heat intolerance, polydipsia, polyphagia and polyuria.  Genitourinary: Negative.   Musculoskeletal: Positive for back pain and joint swelling. Negative for arthralgias, gait problem, myalgias, neck pain and neck stiffness.  Neurological: Positive for weakness and numbness. Negative for dizziness, tremors, seizures, syncope, facial asymmetry, speech difficulty, light-headedness and headaches.  Hematological: Negative.  Negative for adenopathy. Does not bruise/bleed easily.  Psychiatric/Behavioral: Negative.  Negative for agitation, behavioral problems, confusion, decreased concentration, dysphoric mood, hallucinations, self-injury, sleep disturbance and suicidal ideas. The patient is not nervous/anxious and is not hyperactive.      Objective: Vital Signs: BP 134/85 (BP Location: Left Arm, Patient Position: Sitting)   Pulse 72   Ht 5\' 7"  (1.702 m)   Wt 175 lb (79.4 kg)   BMI 27.41 kg/m   Physical Exam Constitutional:      Appearance: He is well-developed.  HENT:     Head: Normocephalic and atraumatic.  Eyes:     Pupils: Pupils are equal, round, and reactive to light.  Pulmonary:     Effort: Pulmonary effort is normal.     Breath sounds: Normal  breath sounds.  Abdominal:     General: Bowel sounds are normal.     Palpations: Abdomen is soft.  Musculoskeletal:     Cervical back: Normal range of motion and neck supple.     Lumbar back: Negative left straight leg raise test.  Skin:    General: Skin is warm and dry.  Neurological:     Mental Status: He is alert and oriented to person, place, and time.    Psychiatric:        Behavior: Behavior normal.        Thought Content: Thought content normal.        Judgment: Judgment normal.     Back Exam   Tenderness  The patient is experiencing tenderness in the lumbar.  Range of Motion  Extension: abnormal  Flexion: abnormal  Lateral bend right: abnormal  Lateral bend left: abnormal  Rotation right: abnormal  Rotation left: abnormal   Muscle Strength  The patient has normal back strength. Right Quadriceps:  5/5  Left Quadriceps:  5/5  Right Hamstrings:  5/5  Left Hamstrings:  5/5   Tests  Straight leg raise left: negative  Reflexes  Patellar:  2/4 normal Achilles:  2/4 normal Babinski's sign: normal   Other  Toe walk: normal Heel walk: normal Sensation: decreased Gait: normal  Erythema: no back redness Scars: absent      Specialty Comments:  No specialty comments available.  Imaging: No results found.   PMFS History: Patient Active Problem List   Diagnosis Date Noted  . Lumbar disc herniation with radiculopathy 09/20/2015    Priority: High    Class: Acute  . Ganglion cyst of wrist 09/20/2015    Priority: High    Class: Chronic  . OSA (obstructive sleep apnea) 07/22/2014  . Hyperlipidemia 01/08/2013  . Elevated LFTs 01/24/2012  . Chronic systolic heart failure ([place]) 11/11/2010  . Asthma 11/11/2010  . ANAL AND RECTAL POLYP 01/14/2010  . HEARTBURN 01/14/2010  . RECTAL BLEEDING 11/24/2009  . BREAST MASS, LEFT 11/24/2009  . COUGH 05/11/2009  . HYPERTRIGLYCERIDEMIA 12/29/2008  . Chest pain 12/29/2008  . Essential hypertension 03/11/2008  . Nonischemic cardiomyopathy ([place]) 03/11/2008  . Other and unspecified hyperlipidemia 12/25/2006  . ALCOHOLISM 12/25/2006  . ALLERGIC RHINITIS 12/25/2006  . HYPERGLYCEMIA 12/25/2006  . UROLITHIASIS, HX OF 12/25/2006   Past Medical History:  Diagnosis Date  . Allergic rhinitis   . Anxiety   . Asthma    Probable mild intermittent asthma  . CKD (chronic kidney  disease)   . Diverticulosis    a. Colonoscopy (1/12) with mild diverticulosis and hemorrhoids  . Elevated LFTs    HCV negative, possibly due to ETOH  . GERD (gastroesophageal reflux disease)    EGD 1/12 with erosive esophagitis and gastritis, biopsy postiive for H pylori (being  treated)  . Gynecomastia    with spironolactone (ok on eplerenone)  . History of back surgery   . HTN (hypertension)   . Hypertriglyceridemia   . Knee osteoarthritis    s/p arhtroscopy 2010  . Nonischemic cardiomyopathy ([place])    [place] 06/2002 with normal cors; Myoview 11/08 neg for ischemia. Echo 4/08: EF of 35-40%.  Cause of NICM unknown.  Never a heavy drinker, used cocaine or amphetamines. HIV, SPEP, and ferritin/Fe studies were all negative in 12/09. Myoview (5/11): EF 42% with apical thinning but no evidence for ischemia or infarction. Echo (3/12): EF 45-50%, no significant valvular abnormalities.   . Rotator cuff tendonitis    with  hx of bilateral shoulder surgery  . Sleep apnea    doesnot use cpap    Family History  Problem Relation Age of Onset  . Stroke Mother   . Prostate cancer Father   . Cardiomyopathy Brother        nonischemic, has icd  . Cirrhosis Brother        alcoholic   . Pancreatitis Sister   . Colon cancer Neg Hx   . Esophageal cancer Neg Hx   . Pancreatic cancer Neg Hx   . Liver disease Neg Hx     Past Surgical History:  Procedure Laterality Date  . ELECTROCARDIOGRAM  09/20/06  . GANGLION CYST EXCISION Right 09/20/2015   Procedure: REMOVAL GANGLION OF WRIST;  Surgeon: Jessy Oto, MD;  Location: [facility];  Service: Orthopedics;  Laterality: Right;  . KNEE ARTHROSCOPY Left    x 2, then partial replacement  . LUMBAR LAMINECTOMY Right 09/20/2015   Procedure: FAR LATERAL APPROACH TO EXCISE HERNIATED NUCLEUS PULPOSUS RIGHT Lumbar 1- Lumbar 2 AND RIGHT Lumbar 2-Lumbar 3, EXCISION OF GANGLION CYST RIGHT VOLAR RADIAL WRIST;  Surgeon: Jessy Oto, MD;  Location: [facility];  Service: Orthopedics;   Laterality: Right;  . MEDIAL PARTIAL KNEE REPLACEMENT Left   . PTCA    . ROTATOR CUFF REPAIR     2R; 3 L  . stress cardiolite  12/15/05  . TRANSTHORACIC ECHOCARDIOGRAM  07/13/06   Social History   Occupational History  . Occupation: Disability  Tobacco Use  . Smoking status: Never Smoker  . Smokeless tobacco: Never Used  . Tobacco comment: denies   Substance and Sexual Activity  . Alcohol use: Yes    Alcohol/week: 6.0 standard drinks    Types: 6 Shots of liquor per week    Comment: weekends  . Drug use: No    Comment: Smoked marijuana on occasion in distant past.   . Sexual activity: Not Currently

## 2019-04-26 NOTE — Progress Notes (Signed)
Pulmonary: Dr Elsworth Soho PCP: Lorayne Marek Charlene Brooke, NP Cardiology: Dr. Aundra Dubin  HPI:  69 y.o. with history of nonischemic cardiomyopathy presents for followup of CHF.  Most recent echo in 10/18 showed improvement in EF to 50-55%. He has continued to do well symptomatically.  Not as active with COVID pandemic (no longer going out dancing on the weekends).  No significant exertional dyspnea.  No orthopnea/PND.  No lightheadedness.  No chest pain.  Main complaint is shoulder pain from a torn rotator cuff and sciatica. He is drinking up to 3-4 beers/day now.  Today he returns to HF clinic for pharmacist medication titration. At last visit with MD, an echo was performed and his EF had decreased to 40-45%, so his Delene Loll was increased to 49/51 mg BID. Overall he is doing well with those changes. No dizziness, lightheadedness, chest pain or palpitations. Only complaint today is pain in his rotator cuff, for which he sees Ortho. No SOB/DOE. He is trying to stay active and goes to the gym ~4 times per week. His weight is stable at 182-183 lbs. He is not taking any diuretic. No LEE, PND or orthopnea. His appetite is good. Taking all medications as prescribed. No side effects noted to any current medications.      . Shortness of breath/dyspnea on exertion? no  . Orthopnea/PND? no . Edema? no . Lightheadedness/dizziness? no . Daily weights at home? yes . Blood pressure/heart rate monitoring at home? yes . Following low-sodium/fluid-restricted diet? yes  HF Medications: Carvedilol 12.5 mg BID Entresto 49/51 mg BID Eplerenone 25 mg daily  Has the patient been experiencing any side effects to the medications prescribed?  no  Does the patient have any problems obtaining medications due to transportation or finances?   No - Has Medicare part D. Obtained Inspra (eplerenone) through [employer] patient assistance as it is not covered on his insurance plan. Uses PAN grant to help with [facility] copay.   Understanding of  regimen: good Understanding of indications: good Potential of compliance: good Patient understands to avoid NSAIDs. Patient understands to avoid decongestants.    Pertinent Lab Values: . Serum creatinine 1.15, BUN 11, Potassium 4.4, Sodium 141  Vital Signs: . Weight: 182.8 lbs (last clinic weight: 183.8 lbs) . Blood pressure: 120/74  . Heart rate: 80   Assessment: 1. Chronic systolic heart failure: Nonischemic cardiomyopathy, ECHO 04/2014 with EF down to 35-40%.  Cardiac MRI in 3/17, however, showed EF 43% with mid-wall pattern of LGE in the septum that may be suggestive of prior myocarditis.  Echo in 10/18 showed EF up to 50-55%. No heavy substance abuse (though occasionally binge drinks), HIV negative, SPEP negative, no evidence for hemochromatosis. Would also consider familial cardiomyopathy as his brother apparently also has a nonischemic cardiomyopathy and got an ICD.   -Echo 03/24/19 EF 40-45% -NYHA I-II. Volume status stable. Not currently on furosemide.   - Vitals: BP 120/74, HR 80 - Labs: Scr slightly increased from 0.92 to 1.15. K 4.4.  - Continue carvedilol 12.5 mg twice a day.  - Increase Entresto to 97/103 mg BID. Repeat BMET at next clinic visit.  - Continue Inspra (eplerenone) 25 mg daily. Obtains from [employer] patient assistance program. - He would be a good candidate for SGLT2i in the future. Pharmacy Benefits Investigation: Farxiga copay $47.00.  2. Hyperlipidemia:  -Lipid panel 03/12/19: TC 168, LDL 100, HDL 56 -Continue rosuvastatin 40 mg daily.  3. OSA: Unable to tolerate CPAP.   Plan: 1) Medication changes: Based on clinical presentation,  vital signs and recent labs will increase Entresto to 97/103 mg BID.  2) Follow-up: Return to HF Clinic with Dr. Aundra Dubin in 4 weeks.    Audry Riles, PharmD, BCPS, BCCP, CPP Heart Failure Clinic Pharmacist [phone]

## 2019-04-28 ENCOUNTER — Other Ambulatory Visit: Payer: Self-pay

## 2019-04-28 ENCOUNTER — Ambulatory Visit (HOSPITAL_COMMUNITY)
Admission: RE | Admit: 2019-04-28 | Discharge: 2019-04-28 | Disposition: A | Discharge status: Home / Self Care | Payer: Medicare HMO | Source: Ambulatory Visit | Attending: Cardiology | Admitting: Cardiology

## 2019-04-28 VITALS — BP 120/74 | HR 80 | Wt 182.8 lb

## 2019-04-28 DIAGNOSIS — Z7901 Long term (current) use of anticoagulants: Secondary | ICD-10-CM | POA: Insufficient documentation

## 2019-04-28 DIAGNOSIS — I5022 Chronic systolic (congestive) heart failure: Secondary | ICD-10-CM | POA: Insufficient documentation

## 2019-04-28 DIAGNOSIS — G4733 Obstructive sleep apnea (adult) (pediatric): Secondary | ICD-10-CM | POA: Insufficient documentation

## 2019-04-28 DIAGNOSIS — I428 Other cardiomyopathies: Secondary | ICD-10-CM | POA: Diagnosis not present

## 2019-04-28 DIAGNOSIS — Z79899 Other long term (current) drug therapy: Secondary | ICD-10-CM | POA: Diagnosis not present

## 2019-04-28 DIAGNOSIS — E785 Hyperlipidemia, unspecified: Secondary | ICD-10-CM | POA: Insufficient documentation

## 2019-04-28 LAB — BASIC METABOLIC PANEL
Anion gap: 9 (ref 5–15)
BUN: 11 mg/dL (ref 8–23)
CO2: 25 mmol/L (ref 22–32)
Calcium: 9.1 mg/dL (ref 8.9–10.3)
Chloride: 107 mmol/L (ref 98–111)
Creatinine, Ser: 1.15 mg/dL (ref 0.61–1.24)
GFR calc Af Amer: >60 mL/min (ref >60)
GFR calc non Af Amer: >60 mL/min (ref >60)
Glucose, Bld: 108 mg/dL — ABNORMAL HIGH (ref 70–99)
Potassium: 4.4 mmol/L (ref 3.5–5.1)
Sodium: 141 mmol/L (ref 135–145)

## 2019-04-28 MED ORDER — SACUBITRIL-VALSARTAN 97-103 MG PO TABS: 1.0000 | ORAL_TABLET | Freq: Two times a day (BID) | ORAL | 11 refills | Status: DC

## 2019-04-28 NOTE — Patient Instructions (Signed)
It was a pleasure seeing you today!  MEDICATIONS: -We are changing your medications today -Increase Entresto to 97/103 mg (1 tab) twice daily. You may take 2 tablets of the 49/51 mg strength to equal the new dose until you can pick up your new prescription. -Call if you have questions about your medications.  LABS: -We will call you if your labs need attention.  NEXT APPOINTMENT: Return to clinic in 1 month with Dr. Aundra Dubin.  In general, to take care of your heart failure: -Limit your fluid intake to 2 Liters (half-gallon) per day.   -Limit your salt intake to ideally 2-3 grams (2000-3000 mg) per day. -Weigh yourself daily and record, and bring that "weight diary" to your next appointment.  (Weight gain of 2-3 pounds in 1 day typically means fluid weight.) -The medications for your heart are to help your heart and help you live longer.   -Please contact us before stopping any of your heart medications.  Call the clinic at [phone] with questions or to reschedule future appointments.

## 2019-05-12 ENCOUNTER — Other Ambulatory Visit: Payer: Self-pay | Admitting: Specialist

## 2019-05-12 NOTE — Telephone Encounter (Signed)
I called and lmom that it does take several weeks to get a response from Pain Management providers and an appt with them. I advised that I have sent a request for refill on the hydrocodone to Dr. Louanne Skye and that he should check with his pharmacy later this evening.

## 2019-05-12 NOTE — Telephone Encounter (Signed)
Pt called in said at his last visit he was told dr.nitka was going to refer him to a pain clinic, but hasn't heard anything. Pt is wondering if dr.nitka will refill his Hydrocodone since he hasn't heard anything for the clinic? Please have that sent to [facility] on [address].   [phone]

## 2019-05-13 MED ORDER — HYDROCODONE-ACETAMINOPHEN 7.5-325 MG PO TABS: 1.0000 | ORAL_TABLET | Freq: Four times a day (QID) | ORAL | 0 refills | Status: DC | PRN

## 2019-05-27 ENCOUNTER — Encounter (HOSPITAL_COMMUNITY): Payer: Medicare HMO | Admitting: Cardiology

## 2019-06-06 ENCOUNTER — Other Ambulatory Visit: Payer: Self-pay | Admitting: Orthopaedic Surgery

## 2019-06-09 NOTE — Telephone Encounter (Signed)
Nitka patient 

## 2019-06-10 ENCOUNTER — Telehealth (HOSPITAL_COMMUNITY): Payer: Self-pay

## 2019-06-10 ENCOUNTER — Other Ambulatory Visit (HOSPITAL_COMMUNITY): Payer: Medicare HMO

## 2019-06-10 NOTE — Telephone Encounter (Signed)

## 2019-06-11 ENCOUNTER — Other Ambulatory Visit: Payer: Self-pay

## 2019-06-11 ENCOUNTER — Ambulatory Visit (HOSPITAL_COMMUNITY)
Admission: RE | Admit: 2019-06-11 | Discharge: 2019-06-11 | Disposition: A | Discharge status: Home / Self Care | Payer: Medicare HMO | Source: Ambulatory Visit | Attending: Cardiology | Admitting: Cardiology

## 2019-06-11 ENCOUNTER — Encounter (HOSPITAL_COMMUNITY): Payer: Self-pay | Admitting: Cardiology

## 2019-06-11 VITALS — BP 112/82 | HR 80 | Wt 185.0 lb

## 2019-06-11 DIAGNOSIS — Z7901 Long term (current) use of anticoagulants: Secondary | ICD-10-CM | POA: Insufficient documentation

## 2019-06-11 DIAGNOSIS — N189 Chronic kidney disease, unspecified: Secondary | ICD-10-CM | POA: Insufficient documentation

## 2019-06-11 DIAGNOSIS — I5022 Chronic systolic (congestive) heart failure: Secondary | ICD-10-CM

## 2019-06-11 DIAGNOSIS — I130 Hypertensive heart and chronic kidney disease with heart failure and stage 1 through stage 4 chronic kidney disease, or unspecified chronic kidney disease: Secondary | ICD-10-CM | POA: Insufficient documentation

## 2019-06-11 DIAGNOSIS — G4733 Obstructive sleep apnea (adult) (pediatric): Secondary | ICD-10-CM | POA: Diagnosis not present

## 2019-06-11 DIAGNOSIS — E781 Pure hyperglyceridemia: Secondary | ICD-10-CM | POA: Diagnosis not present

## 2019-06-11 DIAGNOSIS — I428 Other cardiomyopathies: Secondary | ICD-10-CM | POA: Diagnosis not present

## 2019-06-11 DIAGNOSIS — K219 Gastro-esophageal reflux disease without esophagitis: Secondary | ICD-10-CM | POA: Diagnosis not present

## 2019-06-11 DIAGNOSIS — E785 Hyperlipidemia, unspecified: Secondary | ICD-10-CM | POA: Diagnosis not present

## 2019-06-11 DIAGNOSIS — Z79899 Other long term (current) drug therapy: Secondary | ICD-10-CM | POA: Diagnosis not present

## 2019-06-11 LAB — COMPREHENSIVE METABOLIC PANEL
ALT: 32 U/L (ref 0–44)
AST: 37 U/L (ref 15–41)
Albumin: 4 g/dL (ref 3.5–5.0)
Alkaline Phosphatase: 48 U/L (ref 38–126)
Anion gap: 10 (ref 5–15)
BUN: 19 mg/dL (ref 8–23)
CO2: 20 mmol/L — ABNORMAL LOW (ref 22–32)
Calcium: 9 mg/dL (ref 8.9–10.3)
Chloride: 107 mmol/L (ref 98–111)
Creatinine, Ser: 1.18 mg/dL (ref 0.61–1.24)
GFR calc Af Amer: >60 mL/min (ref >60)
GFR calc non Af Amer: >60 mL/min (ref >60)
Glucose, Bld: 98 mg/dL (ref 70–99)
Potassium: 4.5 mmol/L (ref 3.5–5.1)
Sodium: 137 mmol/L (ref 135–145)
Total Bilirubin: 0.9 mg/dL (ref 0.3–1.2)
Total Protein: 6.6 g/dL (ref 6.5–8.1)

## 2019-06-11 LAB — LIPID PANEL
Cholesterol: 147 mg/dL (ref 0–200)
HDL: 32 mg/dL — ABNORMAL LOW (ref >40)
LDL Cholesterol: 69 mg/dL (ref 0–99)
Total CHOL/HDL Ratio: 4.6 RATIO
Triglycerides: 230 mg/dL — ABNORMAL HIGH (ref <150)
VLDL: 46 mg/dL — ABNORMAL HIGH (ref 0–40)

## 2019-06-11 MED ORDER — EPLERENONE 50 MG PO TABS: 50.0000 mg | ORAL_TABLET | Freq: Every day | ORAL | 5 refills | Status: DC

## 2019-06-11 NOTE — Patient Instructions (Signed)
INCREASE Eplerenone to 50mg  (1 tab) daily   Labs today and repeat in 10 days We will only contact you if something comes back abnormal or we need to make some changes. Otherwise no news is good news!   Dr Aundra Dubin recommends that you cut out drinking completely.   You were provided with a parking placard application.    Your physician recommends that you schedule a follow-up appointment in: 3 months with Dr Aundra Dubin   Please call office at [phone] option 2 if you have any questions or concerns.    At the [facility], you and your health needs are our priority. As part of our continuing mission to provide you with exceptional heart care, we have created designated Provider Care Teams. These Care Teams include your primary Cardiologist (physician) and Advanced Practice Providers (APPs- Physician Assistants and Nurse Practitioners) who all work together to provide you with the care you need, when you need it.   You may see any of the following providers on your designated Care Team at your next follow up: Marland Kitchen Dr Glori Bickers . Dr Loralie Champagne . Darrick Grinder, NP . Lyda Jester, PA . Audry Riles, PharmD   Please be sure to bring in all your medications bottles to every appointment.

## 2019-06-12 ENCOUNTER — Other Ambulatory Visit: Payer: Self-pay | Admitting: Specialist

## 2019-06-12 NOTE — Telephone Encounter (Signed)
Patient called needing Rx refilled (Hydrocodone) Patient also said he was suppose to be referred to a pain clinic but, have not heard from anyone. The number to contact patient is [phone]

## 2019-06-12 NOTE — Progress Notes (Signed)
Patient ID: [name] [name], male   DOB: 1950-07-03, 69 y.o.   MRN: [id number] Pulmonary: Dr Elsworth Soho PCP: Imagene Riches, NP Cardiology: Dr. Aundra Dubin  69 y.o. with history of nonischemic cardiomyopathy presents for followup of CHF.  Last echo in 12/20 showed EF 40-45%.  This is mildly lower than prior.  He has been drinking more, he reports 4-6 drinks/day.  No significant exertional dyspnea, he remains very active.  No chest pain.  No orthopnea/PND.  No lightheadedness.   ECG (personally reviewed): NSR, RBBB  Labs (12/09): SPEP negative, HIV negative, transferrin saturation 29%, BNP 24 Labs (9/10): direct LDL 54, HDL 13.8, triglycerides 1470, creatinine 0.8 Labs (05/11/09): BNP 27, WBCs 5.6 Labs (5/11): K 4.6, creatinine 1.2, TGs 238, LDL 108, HDL 34 Labs (8/11): K 4.3, creatinine 1.4, BNP 12.6 Labs (2/12): K 4.1, creatinine 1.7, LDL 102 Labs (8/12): K 4.7, creatinine 1.5 Labs (4/13): LDL 132, HDL 33.4, AST 41, ALT 63 Labs (7/13): K 4, creatinine 1.2, ALT 59, AST 36, HCV negative Labs (1/14): K 4.1, creatinine 1.5 Labs (04/24/14): Cholesterol 222, TGL 265, HDL 37, LDL 169 Labs (4/16): LDL 110, HDl 38 Labs (9/16): LDL 116, HDL 36, TGs 175 Labs (01/2015): K 3.8 Creatinine 1.2 => 1.08, HCT 37 Labs (3/17): creatinine 1.07, BNP 8.9 Labs (6/17): K 4.2, creatinine 1.0 Labs (9/18): TGs 215, LDL 106, HDL 40 Labs (12/18): K 4.4, creatinine 1.09 Labs (12/19): LDL 149 Labs (12/20): LDL 100 Labs (1/21): K 4.4, creatinine 1.15  Allergies (verified):  No Known Drug Allergies  Past Medical History: 1. Nonischemic cardiomyopathy.  The patient had a left heart catheterization done in March 2004 with normal coronaries and then in November 2008, he had a Myoview done that showed an EF of 39% with no ischemia.  Echo in April 2008 showed an EF of 35-40%, moderate diffuse hypokinesis, mild left atrial enlargement, normal RV size and function and essentially normal valves.  The cause of his cardiomyopathy has not  been discovered. He drinks but not extremely heavily.  He has never used cocaine and amphetamines.  HIV, SPEP, and ferritin/Fe studies were all negative in 12/09.  Echo (9/10): EF 40%, mild to moderate global HK, mild LAE.  Myoview (5/11): EF 42% with apical thinning but no evidence for ischemia or infarction.  Echo (3/12): EF 45-50%, no significant valvular abnormalities. Echo (1/16) with EF 35-40%, diffuse hypokinesis.  - Cardiac MRI (3/17) with EF 43%, mild diffuse hypokinesis, mid-wall LGE in the mid septum, possible prior viral myocarditis.  - Echo (10/18): EF 50-55%, normal RV size and systolic function.  - Echo (12/20): EF 40-45%, normal RV.  2. Hypertriglyceridemia 3. Rotator cuff tendonitis with a history of bilateral shoulder surgery. 4. Hypertension. 5. History of a back surgery. 6. Knee osteoarthritis, s/p arthroscopy 2010 7. Gastroesophageal reflux disease. EGD (1/12) with erosive esophagitis and gastritis, biopsy positive for H pylori (being treated). 8. Colonoscopy (1/12) with mild diverticulosis and hemorrhoids.  9. Allergic rhinitis 10. Gynecomastia with spironolactone (ok on eplerenone).  11. Probable mild intermittent asthma 12. Elevated LFTs: HCV negative, possibly due to ETOH 13. CKD 14. OSA: CPAP.  15. H/o herniated nucleus pulposus: s/p surgery 6/17.  Family History: No premature CAD.  Father with prostate cancer Mother with CVA Brother with nonischemic cardiomyopathy, has ICD Grandmother with cancer (unsure what)    Social History: The patient denies smoking.  Lives in [place].  He does not use any drugs. He has never used any illicit drugs other than marijuana,  which he smoked occasionally in the distant past.  Moderate ETOH.He is on disability. He is divorced with two sons.     ROS:  All systems reviewed and negative except as per HPI.    Current Outpatient Medications  Medication Sig Dispense Refill  . albuterol (VENTOLIN HFA) 108 (90 Base) MCG/ACT  inhaler Inhale 1-2 puffs into the lungs every 6 (six) hours as needed. 18 g 11  . ALPRAZolam (XANAX) 0.5 MG tablet Take 0.5 mg by mouth at bedtime as needed.     . carvedilol (COREG) 12.5 MG tablet Take 1 tablet (12.5 mg total) by mouth 2 (two) times daily. 180 tablet 0  . eplerenone (INSPRA) 50 MG tablet Take 1 tablet (50 mg total) by mouth daily. 30 tablet 5  . EQ ALLERGY RELIEF, CETIRIZINE, 10 MG tablet Take 1 tablet by mouth once daily 30 tablet 5  . fluticasone (FLONASE) 50 MCG/ACT nasal spray Place 2 sprays into both nostrils daily. 16 g 0  . HYDROcodone-acetaminophen (NORCO) 7.5-325 MG tablet Take 1 tablet by mouth every 6 (six) hours as needed for moderate pain. 30 tablet 0  . LYRICA 75 MG capsule Take 1 capsule by mouth two times daily. 180 capsule 0  . pantoprazole (PROTONIX) 40 MG tablet Take 1 tablet (40 mg total) by mouth daily. 90 tablet 3  . rosuvastatin (CRESTOR) 40 MG tablet Take 1 tablet (40 mg total) by mouth daily. 30 tablet 11  . sacubitril-valsartan (ENTRESTO) 97-103 MG Take 1 tablet by mouth 2 (two) times daily. 60 tablet 11  . sodium chloride (OCEAN) 0.65 % SOLN nasal spray Place 1 spray into both nostrils as needed for congestion. 15 mL 0   No current facility-administered medications for this encounter.    BP 112/82   Pulse 80   Wt 83.9 kg (185 lb)   SpO2 98%   BMI 28.98 kg/m  General: NAD Neck: No JVD, no thyromegaly or thyroid nodule.  Lungs: Clear to auscultation bilaterally with normal respiratory effort. CV: Nondisplaced PMI.  Heart regular S1/S2, no S3/S4, no murmur.  No peripheral edema.  No carotid bruit.  Normal pedal pulses.  Abdomen: Soft, nontender, no hepatosplenomegaly, no distention.  Skin: Intact without lesions or rashes.  Neurologic: Alert and oriented x 3.  Psych: Normal affect. Extremities: No clubbing or cyanosis.  HEENT: Normal.   Assessment/Plan:  1. Chronic systolic heart failure: Nonischemic cardiomyopathy, ECHO 04/2014 with EF down  to 35-40%.  Cardiac MRI in 3/17, however, showed EF 43% with mid-wall pattern of LGE in the septum that may be suggestive of prior myocarditis.  Echo in 10/18 showed EF up to 50-55. HIV negative, SPEP negative, no evidence for hemochromatosis. Would also consider familial cardiomyopathy as his brother apparently also has a nonischemic cardiomyopathy and got an ICD.  Most recent echo in 12/20 showed EF down to 40-45%.  He has been drinking more, this could be the cause of EF fall. NYHA I-II. Volume status stable. Not currently on lasix.  - I asked him to cut back on ETOH intake.  - Increase eplerenone to 50 mg daily, BMET today and in 10 days.  - Continue carvedilol 12.5 mg twice a day.  - Continue Entresto 97/103 bid.   2. Hyperlipidemia: He is on Crestor, check lipids today.  3. OSA: Unable to tolerate CPAP.   Followup in 3 months.   Loralie Champagne 06/12/2019

## 2019-06-13 ENCOUNTER — Ambulatory Visit (INDEPENDENT_AMBULATORY_CARE_PROVIDER_SITE_OTHER): Payer: Medicare HMO

## 2019-06-13 ENCOUNTER — Encounter: Payer: Self-pay | Admitting: Family

## 2019-06-13 ENCOUNTER — Telehealth: Payer: Self-pay | Admitting: Specialist

## 2019-06-13 ENCOUNTER — Telehealth (HOSPITAL_COMMUNITY): Payer: Self-pay | Admitting: Pharmacist

## 2019-06-13 ENCOUNTER — Other Ambulatory Visit: Payer: Self-pay

## 2019-06-13 ENCOUNTER — Ambulatory Visit (INDEPENDENT_AMBULATORY_CARE_PROVIDER_SITE_OTHER): Payer: Medicare HMO | Admitting: Family

## 2019-06-13 VITALS — Ht 67.0 in | Wt 185.0 lb

## 2019-06-13 DIAGNOSIS — M542 Cervicalgia: Secondary | ICD-10-CM

## 2019-06-13 MED ORDER — PREDNISONE 50 MG PO TABS: ORAL_TABLET | ORAL | 0 refills | Status: DC

## 2019-06-13 MED ORDER — METHOCARBAMOL 500 MG PO TABS: 500.0000 mg | ORAL_TABLET | Freq: Three times a day (TID) | ORAL | 0 refills | Status: DC

## 2019-06-13 NOTE — Telephone Encounter (Signed)
Sent in [place] form to [facility] as Inspra was increased to 50 mg daily at patient's last [facility] Clinic appointment. Form was received and prescription has been processed. The clinic will receive the shipment in ~7-10 business days. The Order tracking information is [phone]. Patient is aware.   Audry Riles, PharmD, BCPS, BCCP, CPP Heart Failure Clinic Pharmacist [phone]

## 2019-06-13 NOTE — Telephone Encounter (Signed)
Patient called.   He is requesting a refill on his HydroCodone and a referral to a pain management doctor.   Call back: [phone]

## 2019-06-13 NOTE — Telephone Encounter (Signed)
Patient called and requested a refill of Hydrocodone and a referral to pain management. Please call patient at [phone]  Not sure where previous message was sent to.

## 2019-06-14 MED ORDER — HYDROCODONE-ACETAMINOPHEN 7.5-325 MG PO TABS: 1.0000 | ORAL_TABLET | Freq: Four times a day (QID) | ORAL | 0 refills | Status: DC | PRN

## 2019-06-16 NOTE — Telephone Encounter (Signed)
I called and advised that his medication was sent in on Friday 06/14/2019, and as far as the pain management it take at least 6-8 weeks to get an appt and sometimes longer.

## 2019-06-17 NOTE — Progress Notes (Signed)
Office Visit Note   Patient: [name] [name]           Date of Birth: March 05, 1951           MRN: [id number] Visit Date: 06/13/2019              Requested by: Imagene Riches, NP [place] [place],  [place] [address] PCP: Imagene Riches, NP  Chief Complaint  Patient presents with  . Right Shoulder - Pain  . Neck - Pain      HPI: Patient is a 69 year old gentleman who presents today complaining of right-sided neck and right shoulder pain.  He had a cortisone injection of the right shoulder last on December 1 of 2020.  This provided him with minimal relief.  He has a recent MRI of his shoulder November 2020.  He does have a history of RC tear and has had 2 previous shoulders surgeries on the right.  He is complaining of stiffness loss of range of motion of his right upper extremity as well as some associated right upper back points to his scapular border some shooting pain down his arm anteriorly.  Some numbness and tingling this is in both hands.  Some shooting pain in both biceps.  He has had no recent injury.  He is currently trying to get into pain management.  Assessment & Plan: Visit Diagnoses:  1. Neck pain     Plan: Having some muscular pain and spasms in his upper back will trial a muscle relaxer as well as hot compresses.  Provided the present prednisone course for his C-spine radiculopathy.  Follow-Up Instructions: Return if symptoms worsen or fail to improve.   Back Exam   Tenderness  The patient is experiencing no tenderness.   Range of Motion  The patient has normal back ROM.  Comments:  Neg spurling   Right Hand Exam   Muscle Strength  Grip: 4/5    Left Hand Exam   Muscle Strength  Grip:  4/5    Right Shoulder Exam   Tenderness  The patient is experiencing tenderness in the biceps tendon.  Range of Motion  The patient has normal right shoulder ROM.      Patient is alert, oriented, no adenopathy, well-dressed, normal affect, normal  respiratory effort.   Imaging: No results found. No images are attached to the encounter.  Labs: No results found for: HGBA1C, ESRSEDRATE, CRP, LABURIC, REPTSTATUS, GRAMSTAIN, CULT, LABORGA   Lab Results  Component Value Date   ALBUMIN 4.0 06/11/2019   ALBUMIN 3.9 03/29/2018   ALBUMIN 3.8 10/22/2017    No results found for: MG No results found for: VD25OH  No results found for: PREALBUMIN CBC EXTENDED Latest Ref Rng & Units 03/12/2019 12/25/2016 09/21/2015  WBC 4.0 - 10.5 K/uL 8.0 4.9 10.8(H)  RBC 4.22 - 5.81 MIL/uL 4.31 4.53 4.05(L)  HGB 13.0 - 17.0 g/dL 14.0 14.9 12.3(L)  HCT 39.0 - 52.0 % 41.8 43.2 36.0(L)  PLT 150 - 400 K/uL 130(L) 129(L) 145(L)  NEUTROABS 1.7 - 7.7 K/uL - - -  LYMPHSABS 0.7 - 4.0 K/uL - - -     Body mass index is 28.98 kg/m.  Orders:  Orders Placed This Encounter  Procedures  . XR Cervical Spine 2 or 3 views   Meds ordered this encounter  Medications  . predniSONE (DELTASONE) 50 MG tablet    Sig: Take one tablet by mouth once daily for 5 days.    Dispense:  5 tablet    Refill:  0  . DISCONTD: methocarbamol (ROBAXIN) 500 MG tablet    Sig: Take 1 tablet (500 mg total) by mouth 3 (three) times daily.    Dispense:  20 tablet    Refill:  0  . methocarbamol (ROBAXIN) 500 MG tablet    Sig: Take 1 tablet (500 mg total) by mouth 3 (three) times daily.    Dispense:  20 tablet    Refill:  0     Procedures: No procedures performed  Clinical Data: No additional findings.  ROS:  All other systems negative, except as noted in the HPI. Review of Systems  Constitutional: Negative for chills and fever.  Musculoskeletal: Positive for arthralgias, myalgias and neck pain. Negative for neck stiffness.  Neurological: Positive for numbness. Negative for weakness.    Objective: Vital Signs: Ht 5\' 7"  (1.702 m)   Wt 185 lb (83.9 kg)   BMI 28.98 kg/m   Specialty Comments:  No specialty comments available.  PMFS History: Patient Active Problem  List   Diagnosis Date Noted  . Lumbar disc herniation with radiculopathy 09/20/2015    Class: Acute  . Ganglion cyst of wrist 09/20/2015    Class: Chronic  . OSA (obstructive sleep apnea) 07/22/2014  . Hyperlipidemia 01/08/2013  . Elevated LFTs 01/24/2012  . Chronic systolic heart failure ([place]) 11/11/2010  . Asthma 11/11/2010  . ANAL AND RECTAL POLYP 01/14/2010  . HEARTBURN 01/14/2010  . RECTAL BLEEDING 11/24/2009  . BREAST MASS, LEFT 11/24/2009  . COUGH 05/11/2009  . HYPERTRIGLYCERIDEMIA 12/29/2008  . Chest pain 12/29/2008  . Essential hypertension 03/11/2008  . Nonischemic cardiomyopathy ([place]) 03/11/2008  . Other and unspecified hyperlipidemia 12/25/2006  . ALCOHOLISM 12/25/2006  . ALLERGIC RHINITIS 12/25/2006  . HYPERGLYCEMIA 12/25/2006  . UROLITHIASIS, HX OF 12/25/2006   Past Medical History:  Diagnosis Date  . Allergic rhinitis   . Anxiety   . Asthma    Probable mild intermittent asthma  . CKD (chronic kidney disease)   . Diverticulosis    a. Colonoscopy (1/12) with mild diverticulosis and hemorrhoids  . Elevated LFTs    HCV negative, possibly due to ETOH  . GERD (gastroesophageal reflux disease)    EGD 1/12 with erosive esophagitis and gastritis, biopsy postiive for H pylori (being  treated)  . Gynecomastia    with spironolactone (ok on eplerenone)  . History of back surgery   . HTN (hypertension)   . Hypertriglyceridemia   . Knee osteoarthritis    s/p arhtroscopy 2010  . Nonischemic cardiomyopathy ([place])    [place] 06/2002 with normal cors; Myoview 11/08 neg for ischemia. Echo 4/08: EF of 35-40%.  Cause of NICM unknown.  Never a heavy drinker, used cocaine or amphetamines. HIV, SPEP, and ferritin/Fe studies were all negative in 12/09. Myoview (5/11): EF 42% with apical thinning but no evidence for ischemia or infarction. Echo (3/12): EF 45-50%, no significant valvular abnormalities.   . Rotator cuff tendonitis    with hx of bilateral shoulder surgery  . Sleep apnea     doesnot use cpap    Family History  Problem Relation Age of Onset  . Stroke Mother   . Prostate cancer Father   . Cardiomyopathy Brother        nonischemic, has icd  . Cirrhosis Brother        alcoholic   . Pancreatitis Sister   . Colon cancer Neg Hx   . Esophageal cancer Neg Hx   . Pancreatic cancer Neg Hx   .  Liver disease Neg Hx     Past Surgical History:  Procedure Laterality Date  . ELECTROCARDIOGRAM  09/20/06  . GANGLION CYST EXCISION Right 09/20/2015   Procedure: REMOVAL GANGLION OF WRIST;  Surgeon: Jessy Oto, MD;  Location: [facility];  Service: Orthopedics;  Laterality: Right;  . KNEE ARTHROSCOPY Left    x 2, then partial replacement  . LUMBAR LAMINECTOMY Right 09/20/2015   Procedure: FAR LATERAL APPROACH TO EXCISE HERNIATED NUCLEUS PULPOSUS RIGHT Lumbar 1- Lumbar 2 AND RIGHT Lumbar 2-Lumbar 3, EXCISION OF GANGLION CYST RIGHT VOLAR RADIAL WRIST;  Surgeon: Jessy Oto, MD;  Location: [facility];  Service: Orthopedics;  Laterality: Right;  . MEDIAL PARTIAL KNEE REPLACEMENT Left   . PTCA    . ROTATOR CUFF REPAIR     2R; 3 L  . stress cardiolite  12/15/05  . TRANSTHORACIC ECHOCARDIOGRAM  07/13/06   Social History   Occupational History  . Occupation: Disability  Tobacco Use  . Smoking status: Never Smoker  . Smokeless tobacco: Never Used  . Tobacco comment: denies   Substance and Sexual Activity  . Alcohol use: Yes    Alcohol/week: 6.0 standard drinks    Types: 6 Shots of liquor per week    Comment: weekends  . Drug use: No    Comment: Smoked marijuana on occasion in distant past.   . Sexual activity: Not Currently

## 2019-06-20 ENCOUNTER — Other Ambulatory Visit (HOSPITAL_COMMUNITY): Payer: Medicare HMO

## 2019-06-23 ENCOUNTER — Other Ambulatory Visit (HOSPITAL_COMMUNITY): Payer: Medicare HMO

## 2019-06-23 ENCOUNTER — Telehealth (HOSPITAL_COMMUNITY): Payer: Self-pay | Admitting: *Deleted

## 2019-06-23 NOTE — Telephone Encounter (Signed)
Received pt's Inspra from [facility], called pt and lm that medication was here and will be left at the front desk for him to p/u

## 2019-07-01 ENCOUNTER — Telehealth (HOSPITAL_COMMUNITY): Payer: Self-pay

## 2019-07-01 ENCOUNTER — Other Ambulatory Visit: Payer: Self-pay

## 2019-07-01 ENCOUNTER — Ambulatory Visit (HOSPITAL_COMMUNITY)
Admission: RE | Admit: 2019-07-01 | Discharge: 2019-07-01 | Disposition: A | Discharge status: Home / Self Care | Payer: Medicare HMO | Source: Ambulatory Visit | Attending: Cardiology | Admitting: Cardiology

## 2019-07-01 DIAGNOSIS — I5022 Chronic systolic (congestive) heart failure: Secondary | ICD-10-CM | POA: Diagnosis present

## 2019-07-01 DIAGNOSIS — I428 Other cardiomyopathies: Secondary | ICD-10-CM

## 2019-07-01 LAB — BASIC METABOLIC PANEL
Anion gap: 8 (ref 5–15)
BUN: 28 mg/dL — ABNORMAL HIGH (ref 8–23)
CO2: 22 mmol/L (ref 22–32)
Calcium: 9 mg/dL (ref 8.9–10.3)
Chloride: 105 mmol/L (ref 98–111)
Creatinine, Ser: 2.05 mg/dL — ABNORMAL HIGH (ref 0.61–1.24)
GFR calc Af Amer: 38 mL/min — ABNORMAL LOW (ref >60)
GFR calc non Af Amer: 33 mL/min — ABNORMAL LOW (ref >60)
Glucose, Bld: 118 mg/dL — ABNORMAL HIGH (ref 70–99)
Potassium: 5.5 mmol/L — ABNORMAL HIGH (ref 3.5–5.1)
Sodium: 135 mmol/L (ref 135–145)

## 2019-07-01 MED ORDER — EPLERENONE 25 MG PO TABS: 25.0000 mg | ORAL_TABLET | Freq: Every day | ORAL | 5 refills | Status: DC

## 2019-07-01 NOTE — Telephone Encounter (Signed)
-----   Message from Larey Dresser, MD sent at 07/01/2019  3:57 PM EDT ----- Hold eplerenone for 2 days then decrease back to 25 mg daily.  BMET 1 week.

## 2019-07-01 NOTE — Telephone Encounter (Signed)
Pt aware of results. Advised of recommendations to hold eplerenone for 2 days then restart at 25mg  daily. Will rtc in 1 week for repeat bmet.

## 2019-07-08 ENCOUNTER — Ambulatory Visit (HOSPITAL_COMMUNITY)
Admission: RE | Admit: 2019-07-08 | Discharge: 2019-07-08 | Disposition: A | Discharge status: Home / Self Care | Payer: Medicare HMO | Source: Ambulatory Visit | Attending: Cardiology | Admitting: Cardiology

## 2019-07-08 ENCOUNTER — Other Ambulatory Visit: Payer: Self-pay

## 2019-07-08 ENCOUNTER — Encounter: Payer: Self-pay | Admitting: Family

## 2019-07-08 ENCOUNTER — Ambulatory Visit (INDEPENDENT_AMBULATORY_CARE_PROVIDER_SITE_OTHER): Payer: Medicare HMO | Admitting: Family

## 2019-07-08 ENCOUNTER — Ambulatory Visit: Payer: Medicare HMO | Admitting: Family

## 2019-07-08 VITALS — Ht 67.0 in | Wt 185.0 lb

## 2019-07-08 DIAGNOSIS — I428 Other cardiomyopathies: Secondary | ICD-10-CM | POA: Insufficient documentation

## 2019-07-08 DIAGNOSIS — M67911 Unspecified disorder of synovium and tendon, right shoulder: Secondary | ICD-10-CM | POA: Diagnosis not present

## 2019-07-08 LAB — BASIC METABOLIC PANEL
Anion gap: 9 (ref 5–15)
BUN: 14 mg/dL (ref 8–23)
CO2: 25 mmol/L (ref 22–32)
Calcium: 8.9 mg/dL (ref 8.9–10.3)
Chloride: 105 mmol/L (ref 98–111)
Creatinine, Ser: 1.01 mg/dL (ref 0.61–1.24)
GFR calc Af Amer: >60 mL/min (ref >60)
GFR calc non Af Amer: >60 mL/min (ref >60)
Glucose, Bld: 107 mg/dL — ABNORMAL HIGH (ref 70–99)
Potassium: 3.8 mmol/L (ref 3.5–5.1)
Sodium: 139 mmol/L (ref 135–145)

## 2019-07-08 MED ORDER — LIDOCAINE HCL 1 % IJ SOLN
5.0000 mL | INTRAMUSCULAR | Status: AC | PRN
  Administered 2019-07-08: 5 mL

## 2019-07-08 MED ORDER — METHYLPREDNISOLONE ACETATE 40 MG/ML IJ SUSP
40.0000 mg | INTRAMUSCULAR | Status: AC | PRN
  Administered 2019-07-08: 40 mg via INTRA_ARTICULAR

## 2019-07-08 NOTE — Progress Notes (Signed)
Office Visit Note   Patient: [name] [name]           Date of Birth: 11-09-1950           MRN: [id number] Visit Date: 07/08/2019              Requested by: Imagene Riches, NP [address] [place],  [place] [address] PCP: Imagene Riches, NP  Chief Complaint  Patient presents with  . Right Shoulder - Pain      HPI: Patient Seen today in follow-up for right shoulder pain.  He last had a Depo-Medrol injection of his subacromial bursa on December 2020.  States this provided him with some interval relief.  Today he has not been having any radicular symptoms no shooting pains no burning no numbness no tingling in his hands.  He has been having deep aching pain in his right shoulder pain with crossarm above head reaching.  Less so with behind back reaching.  He has had chronic issues with his right shoulder pain with 2 previous surgeries on his right shoulder he continues to attempt weight lifting.   He is interested in discussing surgical interventions of his right shoulder.  Does have a recent MRI from November of last year.    Assessment & Plan: Visit Diagnoses: Right recurrent Rotator cuff tear  Plan: Depo-Medrol injection of the right subacromial bursa today.  Patient tolerated well.  We will get him set up for arthroscopic intervention of the right shoulder.  Follow-Up Instructions: Return if symptoms worsen or fail to improve.   Ortho Exam  Patient is alert, oriented, no adenopathy, well-dressed, normal affect, normal respiratory effort. Right shoulder. Distal CMS is intact. Abduction to 50 degrees. Internal rotation to above his belt line.   Imaging:  IMPRESSION: 1. Complete tear of the supraspinatus and infraspinatus tendons with 3.7 cm of retraction. Atrophy of the supraspinatus and infraspinatus muscles. 2. Prior tenodesis versus tenotomy of the long head of the biceps tendon. 3. Mild osteoarthritis of the glenohumeral joint with partial thickness cartilage  loss. Labs: No results found for: HGBA1C, ESRSEDRATE, CRP, LABURIC, REPTSTATUS, GRAMSTAIN, CULT, LABORGA   Lab Results  Component Value Date   ALBUMIN 4.0 06/11/2019   ALBUMIN 3.9 03/29/2018   ALBUMIN 3.8 10/22/2017    No results found for: MG No results found for: VD25OH  No results found for: PREALBUMIN CBC EXTENDED Latest Ref Rng & Units 03/12/2019 12/25/2016 09/21/2015  WBC 4.0 - 10.5 K/uL 8.0 4.9 10.8(H)  RBC 4.22 - 5.81 MIL/uL 4.31 4.53 4.05(L)  HGB 13.0 - 17.0 g/dL 14.0 14.9 12.3(L)  HCT 39.0 - 52.0 % 41.8 43.2 36.0(L)  PLT 150 - 400 K/uL 130(L) 129(L) 145(L)  NEUTROABS 1.7 - 7.7 K/uL - - -  LYMPHSABS 0.7 - 4.0 K/uL - - -     Body mass index is 28.98 kg/m.  Orders:  No orders of the defined types were placed in this encounter.  No orders of the defined types were placed in this encounter.    Procedures: Large Joint Inj: R subacromial bursa on 07/08/2019 10:21 AM Indications: diagnostic evaluation and pain Details: 22 G 1.5 in needle  Arthrogram: No  Medications: 5 mL lidocaine 1 %; 40 mg methylPREDNISolone acetate 40 MG/ML Outcome: tolerated well, no immediate complications Procedure, treatment alternatives, risks and benefits explained, specific risks discussed. Consent was given by the patient. Immediately prior to procedure a time out was called to verify the correct patient, procedure, equipment, support  staff and site/side marked as required. Patient was prepped and draped in the usual sterile fashion.      Clinical Data: No additional findings.  ROS:  All other systems negative, except as noted in the HPI. Review of Systems  Constitutional: Negative for chills and fever.  Musculoskeletal: Positive for arthralgias and myalgias. Negative for joint swelling.  Neurological: Negative for weakness.    Objective: Vital Signs: Ht 5\' 7"  (1.702 m)   Wt 185 lb (83.9 kg)   BMI 28.98 kg/m   Specialty Comments:  No specialty comments available.  PMFS  History: Patient Active Problem List   Diagnosis Date Noted  . Lumbar disc herniation with radiculopathy 09/20/2015    Class: Acute  . Ganglion cyst of wrist 09/20/2015    Class: Chronic  . OSA (obstructive sleep apnea) 07/22/2014  . Hyperlipidemia 01/08/2013  . Elevated LFTs 01/24/2012  . Chronic systolic heart failure ([facility]) 11/11/2010  . Asthma 11/11/2010  . ANAL AND RECTAL POLYP 01/14/2010  . HEARTBURN 01/14/2010  . RECTAL BLEEDING 11/24/2009  . BREAST MASS, LEFT 11/24/2009  . COUGH 05/11/2009  . HYPERTRIGLYCERIDEMIA 12/29/2008  . Chest pain 12/29/2008  . Essential hypertension 03/11/2008  . Nonischemic cardiomyopathy ([facility]) 03/11/2008  . Other and unspecified hyperlipidemia 12/25/2006  . ALCOHOLISM 12/25/2006  . ALLERGIC RHINITIS 12/25/2006  . HYPERGLYCEMIA 12/25/2006  . UROLITHIASIS, HX OF 12/25/2006   Past Medical History:  Diagnosis Date  . Allergic rhinitis   . Anxiety   . Asthma    Probable mild intermittent asthma  . CKD (chronic kidney disease)   . Diverticulosis    a. Colonoscopy (1/12) with mild diverticulosis and hemorrhoids  . Elevated LFTs    HCV negative, possibly due to ETOH  . GERD (gastroesophageal reflux disease)    EGD 1/12 with erosive esophagitis and gastritis, biopsy postiive for H pylori (being  treated)  . Gynecomastia    with spironolactone (ok on eplerenone)  . History of back surgery   . HTN (hypertension)   . Hypertriglyceridemia   . Knee osteoarthritis    s/p arhtroscopy 2010  . Nonischemic cardiomyopathy ([facility])    [place] 06/2002 with normal cors; Myoview 11/08 neg for ischemia. Echo 4/08: EF of 35-40%.  Cause of NICM unknown.  Never a heavy drinker, used cocaine or amphetamines. HIV, SPEP, and ferritin/Fe studies were all negative in 12/09. Myoview (5/11): EF 42% with apical thinning but no evidence for ischemia or infarction. Echo (3/12): EF 45-50%, no significant valvular abnormalities.   . Rotator cuff tendonitis    with hx of bilateral  shoulder surgery  . Sleep apnea    doesnot use cpap    Family History  Problem Relation Age of Onset  . Stroke Mother   . Prostate cancer Father   . Cardiomyopathy Brother        nonischemic, has icd  . Cirrhosis Brother        alcoholic   . Pancreatitis Sister   . Colon cancer Neg Hx   . Esophageal cancer Neg Hx   . Pancreatic cancer Neg Hx   . Liver disease Neg Hx     Past Surgical History:  Procedure Laterality Date  . ELECTROCARDIOGRAM  09/20/06  . GANGLION CYST EXCISION Right 09/20/2015   Procedure: REMOVAL GANGLION OF WRIST;  Surgeon: Jessy Oto, MD;  Location: [facility];  Service: Orthopedics;  Laterality: Right;  . KNEE ARTHROSCOPY Left    x 2, then partial replacement  . LUMBAR LAMINECTOMY Right 09/20/2015  Procedure: FAR LATERAL APPROACH TO EXCISE HERNIATED NUCLEUS PULPOSUS RIGHT Lumbar 1- Lumbar 2 AND RIGHT Lumbar 2-Lumbar 3, EXCISION OF GANGLION CYST RIGHT VOLAR RADIAL WRIST;  Surgeon: Jessy Oto, MD;  Location: [facility];  Service: Orthopedics;  Laterality: Right;  . MEDIAL PARTIAL KNEE REPLACEMENT Left   . PTCA    . ROTATOR CUFF REPAIR     2R; 3 L  . stress cardiolite  12/15/05  . TRANSTHORACIC ECHOCARDIOGRAM  07/13/06   Social History   Occupational History  . Occupation: Disability  Tobacco Use  . Smoking status: Never Smoker  . Smokeless tobacco: Never Used  . Tobacco comment: denies   Substance and Sexual Activity  . Alcohol use: Yes    Alcohol/week: 6.0 standard drinks    Types: 6 Shots of liquor per week    Comment: weekends  . Drug use: No    Comment: Smoked marijuana on occasion in distant past.   . Sexual activity: Not Currently

## 2019-07-15 ENCOUNTER — Other Ambulatory Visit: Payer: Self-pay | Admitting: Specialist

## 2019-07-15 NOTE — Telephone Encounter (Signed)
Patient called and stated that needed refill of Hydrocodone.  Please call [phone]  [place]

## 2019-07-16 MED ORDER — HYDROCODONE-ACETAMINOPHEN 7.5-325 MG PO TABS: 1.0000 | ORAL_TABLET | Freq: Four times a day (QID) | ORAL | 0 refills | Status: DC | PRN

## 2019-07-18 ENCOUNTER — Telehealth: Payer: Self-pay | Admitting: Orthopedic Surgery

## 2019-07-18 NOTE — Telephone Encounter (Signed)
Called Mr. Chann to discuss scheduling surgery for his right shoulder.  Left message on voicemail for him to return my call.

## 2019-07-25 ENCOUNTER — Telehealth: Payer: Self-pay | Admitting: Radiology

## 2019-07-25 ENCOUNTER — Encounter: Payer: Self-pay | Admitting: Specialist

## 2019-07-25 ENCOUNTER — Ambulatory Visit (INDEPENDENT_AMBULATORY_CARE_PROVIDER_SITE_OTHER): Payer: Medicare HMO | Admitting: Specialist

## 2019-07-25 ENCOUNTER — Other Ambulatory Visit: Payer: Self-pay

## 2019-07-25 VITALS — BP 89/61 | HR 67 | Ht 67.0 in | Wt 185.0 lb

## 2019-07-25 DIAGNOSIS — M1712 Unilateral primary osteoarthritis, left knee: Secondary | ICD-10-CM

## 2019-07-25 DIAGNOSIS — M1711 Unilateral primary osteoarthritis, right knee: Secondary | ICD-10-CM

## 2019-07-25 DIAGNOSIS — M25511 Pain in right shoulder: Secondary | ICD-10-CM

## 2019-07-25 DIAGNOSIS — M47812 Spondylosis without myelopathy or radiculopathy, cervical region: Secondary | ICD-10-CM

## 2019-07-25 DIAGNOSIS — M5136 Other intervertebral disc degeneration, lumbar region: Secondary | ICD-10-CM | POA: Diagnosis not present

## 2019-07-25 DIAGNOSIS — I5032 Chronic diastolic (congestive) heart failure: Secondary | ICD-10-CM

## 2019-07-25 DIAGNOSIS — M542 Cervicalgia: Secondary | ICD-10-CM

## 2019-07-25 DIAGNOSIS — M5030 Other cervical disc degeneration, unspecified cervical region: Secondary | ICD-10-CM | POA: Diagnosis not present

## 2019-07-25 DIAGNOSIS — M48061 Spinal stenosis, lumbar region without neurogenic claudication: Secondary | ICD-10-CM | POA: Diagnosis not present

## 2019-07-25 DIAGNOSIS — S4991XA Unspecified injury of right shoulder and upper arm, initial encounter: Secondary | ICD-10-CM

## 2019-07-25 MED ORDER — PREGABALIN 100 MG PO CAPS: 100.0000 mg | ORAL_CAPSULE | Freq: Two times a day (BID) | ORAL | 3 refills | Status: DC

## 2019-07-25 NOTE — Patient Instructions (Signed)
Plan: Avoid frequent bending and stooping  No lifting greater than 10 lbs. May use ice or moist heat for pain. Weight loss is of benefit. Best medication for lumbar disc disease is arthritis medications and you should not take them as they can worsen CHF. Exercise is important to improve your indurance and does allow people to function better inspite of back pain.  Referral to [facility]. Hydrocodone for pain. Refer to your cardiologist and primary care MD concerning the risk of undergoing surgery

## 2019-07-25 NOTE — Progress Notes (Signed)
Office Visit Note   Patient: [name] [name]           Date of Birth: 12-04-1950           MRN: [id number] Visit Date: 07/25/2019              Requested by: Imagene Riches, NP [place],  [place] [address] PCP: Imagene Riches, NP   Assessment & Plan: Visit Diagnoses:  1. Degenerative disc disease, lumbar   2. Spinal stenosis of lumbar region without neurogenic claudication   3. Degenerative disc disease, cervical   4. Spondylosis without myelopathy or radiculopathy, cervical region   5. Right shoulder pain, unspecified chronicity   6. Right shoulder injury, initial encounter   7. Neck pain   8. Unilateral primary osteoarthritis, left knee   9. Unilateral primary osteoarthritis, right knee    Plan: Avoid frequent bending and stooping  No lifting greater than 10 lbs. May use ice or moist heat for pain. Weight loss is of benefit. Best medication for lumbar disc disease is arthritis medications and you should not take them as they can worsen CHF. Exercise is important to improve your indurance and does allow people to function better inspite of back pain.  Referral to [facility]. Hydrocodone for pain. Refer to your cardiologist and primary care MD concerning the risk of undergoing surgery Lyrica increase to 100 mg BID #180 with 3 refills. Follow-Up Instructions: No follow-ups on file.   Orders:  No orders of the defined types were placed in this encounter.  No orders of the defined types were placed in this encounter.     Procedures: No procedures performed   Clinical Data: Findings:  CLINICAL DATA:  Left low back pain for 2 months. Spine surgery in 2017.  EXAM: MRI LUMBAR SPINE WITHOUT CONTRAST  TECHNIQUE: Multiplanar, multisequence MR imaging of the lumbar spine was performed. No intravenous contrast was administered.  COMPARISON:  Lumbar spine radiograph 01/08/2019 and lumbar MRI from 05/03/2015  FINDINGS: Segmentation: The  lowest lumbar type non-rib-bearing vertebra is labeled as L5.  Alignment: 3 mm degenerative retrolisthesis at L2-3 and 4 mm degenerative retrolisthesis at L5-S1.  Vertebrae: 1.5 cm hemangioma eccentric to the right of the L1 vertebral body. Prominent type 2 degenerative endplate findings eccentric to the right at L2-3 the. Mild degenerative facet edema on the right at L4-5.  Disc desiccation and loss of disc height especially at L2-3 and L5-S1. Prominent epidural adipose tissue and congenitally short pedicles in the lumbar spine.  Conus medullaris and cauda equina: Conus extends to the L1 level. Conus and cauda equina appear normal.  Paraspinal and other soft tissues: Unremarkable  Disc levels:  L1-2: Mild displacement of the right L1 nerve in the lateral extraforaminal space due to disc bulge and right lateral extraforaminal disc protrusion. Borderline central narrowing of the thecal sac.  L2-3: Mild displacement of the right L2 nerve in the lateral extraforaminal space due to intervertebral spurring. Borderline central narrowing of the thecal sac mild disc bulge. Impingement at this level is slightly improved compared to the prior exam.  L3-4: Moderate to prominent central narrowing of the thecal sac with displacement of the L3 nerves in the lateral extraforaminal space, right greater than left, due to disc bulge, right foraminal and lateral extraforaminal disc protrusion, short pedicles, and degenerative facet arthropathy. Similar impingement to 05/03/2015.  L4-5: Prominent central narrowing of the thecal sac with mild bilateral foraminal stenosis and moderate left subarticular  lateral recess stenosis due to disc bulge, left paracentral disc protrusion extending into the neural foramen, intervertebral spurring, short pedicles, and facet arthropathy. Cross-sectional area of the thecal sac 0.4 cm^2. Impingement has worsened compared to 05/03/2015.  L5-S1:  Moderate left subarticular lateral recess stenosis with mild left foraminal stenosis due to intervertebral spurring, left lateral recess disc protrusion, and facet arthropathy. Prior left laminectomy noted. Degree of impingement similar to 05/03/15.  IMPRESSION: 1. Lumbar spondylosis and degenerative disc disease, causing prominent impingement at L4-5; moderate to prominent impingement at L3-4; moderate impingement at L5-S1; and mild impingement at L1-2 and L2-3, as detailed above. The impingement is worsened at the L4-5 level.   Electronically Signed   By: Van Clines M.D.   On: 02/10/2019 16:24    Subjective: Chief Complaint  Patient presents with  . Right Shoulder - Pain    69 year old male with history of lumbar DDD and cervical spondylosis and he has bilateral shoulder cuff arthropathy and bilateral knee osteoarthritis. He has pain in his whole body and has been requesting renewal of his medicines for pain that he has in all areas noted in the  Previous sentence. He is exerciseing and taking lyrica 75 mg twice a day and want to go to 150 mg once a day or increase his meds. We Are actively trying to get him into a pain management.    Review of Systems   Objective: Vital Signs: BP (!) 89/61   Pulse 67   Ht 5\' 7"  (1.702 m)   Wt 185 lb (83.9 kg)   BMI 28.98 kg/m   Physical Exam  Ortho Exam  Specialty Comments:  No specialty comments available.  Imaging: No results found.   PMFS History: Patient Active Problem List   Diagnosis Date Noted  . Lumbar disc herniation with radiculopathy 09/20/2015    Priority: High    Class: Acute  . Ganglion cyst of wrist 09/20/2015    Priority: High    Class: Chronic  . OSA (obstructive sleep apnea) 07/22/2014  . Hyperlipidemia 01/08/2013  . Elevated LFTs 01/24/2012  . Chronic systolic heart failure ([place]) 11/11/2010  . Asthma 11/11/2010  . ANAL AND RECTAL POLYP 01/14/2010  . HEARTBURN 01/14/2010  . RECTAL  BLEEDING 11/24/2009  . BREAST MASS, LEFT 11/24/2009  . COUGH 05/11/2009  . HYPERTRIGLYCERIDEMIA 12/29/2008  . Chest pain 12/29/2008  . Essential hypertension 03/11/2008  . Nonischemic cardiomyopathy ([place]) 03/11/2008  . Other and unspecified hyperlipidemia 12/25/2006  . ALCOHOLISM 12/25/2006  . ALLERGIC RHINITIS 12/25/2006  . HYPERGLYCEMIA 12/25/2006  . UROLITHIASIS, HX OF 12/25/2006   Past Medical History:  Diagnosis Date  . Allergic rhinitis   . Anxiety   . Asthma    Probable mild intermittent asthma  . CKD (chronic kidney disease)   . Diverticulosis    a. Colonoscopy (1/12) with mild diverticulosis and hemorrhoids  . Elevated LFTs    HCV negative, possibly due to ETOH  . GERD (gastroesophageal reflux disease)    EGD 1/12 with erosive esophagitis and gastritis, biopsy postiive for H pylori (being  treated)  . Gynecomastia    with spironolactone (ok on eplerenone)  . History of back surgery   . HTN (hypertension)   . Hypertriglyceridemia   . Knee osteoarthritis    s/p arhtroscopy 2010  . Nonischemic cardiomyopathy ([place])    [place] 06/2002 with normal cors; Myoview 11/08 neg for ischemia. Echo 4/08: EF of 35-40%.  Cause of NICM unknown.  Never a heavy drinker, used  cocaine or amphetamines. HIV, SPEP, and ferritin/Fe studies were all negative in 12/09. Myoview (5/11): EF 42% with apical thinning but no evidence for ischemia or infarction. Echo (3/12): EF 45-50%, no significant valvular abnormalities.   . Rotator cuff tendonitis    with hx of bilateral shoulder surgery  . Sleep apnea    doesnot use cpap    Family History  Problem Relation Age of Onset  . Stroke Mother   . Prostate cancer Father   . Cardiomyopathy Brother        nonischemic, has icd  . Cirrhosis Brother        alcoholic   . Pancreatitis Sister   . Colon cancer Neg Hx   . Esophageal cancer Neg Hx   . Pancreatic cancer Neg Hx   . Liver disease Neg Hx     Past Surgical History:  Procedure Laterality Date    . ELECTROCARDIOGRAM  09/20/06  . GANGLION CYST EXCISION Right 09/20/2015   Procedure: REMOVAL GANGLION OF WRIST;  Surgeon: Jessy Oto, MD;  Location: [facility];  Service: Orthopedics;  Laterality: Right;  . KNEE ARTHROSCOPY Left    x 2, then partial replacement  . LUMBAR LAMINECTOMY Right 09/20/2015   Procedure: FAR LATERAL APPROACH TO EXCISE HERNIATED NUCLEUS PULPOSUS RIGHT Lumbar 1- Lumbar 2 AND RIGHT Lumbar 2-Lumbar 3, EXCISION OF GANGLION CYST RIGHT VOLAR RADIAL WRIST;  Surgeon: Jessy Oto, MD;  Location: [facility];  Service: Orthopedics;  Laterality: Right;  . MEDIAL PARTIAL KNEE REPLACEMENT Left   . PTCA    . ROTATOR CUFF REPAIR     2R; 3 L  . stress cardiolite  12/15/05  . TRANSTHORACIC ECHOCARDIOGRAM  07/13/06   Social History   Occupational History  . Occupation: Disability  Tobacco Use  . Smoking status: Never Smoker  . Smokeless tobacco: Never Used  . Tobacco comment: denies   Substance and Sexual Activity  . Alcohol use: Yes    Alcohol/week: 6.0 standard drinks    Types: 6 Shots of liquor per week    Comment: weekends  . Drug use: No    Comment: Smoked marijuana on occasion in distant past.   . Sexual activity: Not Currently

## 2019-07-25 NOTE — Telephone Encounter (Signed)
Rx printed. Patient had already left.  Called in Lyrica Rx to [facility].

## 2019-07-29 ENCOUNTER — Telehealth: Payer: Self-pay | Admitting: Specialist

## 2019-07-29 NOTE — Telephone Encounter (Signed)
Patient called advised Dr Louanne Skye was  going to up the dosage of his Lyrica  to 100mg . Patient asked that the Rx be faxed to [facility]. The Rx will need to be written for 3 refills. The number to contact patient is [phone]

## 2019-08-01 ENCOUNTER — Other Ambulatory Visit: Payer: Self-pay | Admitting: Radiology

## 2019-08-01 MED ORDER — PREGABALIN 100 MG PO CAPS: 100.0000 mg | ORAL_CAPSULE | Freq: Two times a day (BID) | ORAL | 3 refills | Status: AC

## 2019-08-01 NOTE — Telephone Encounter (Signed)
I printed the rx, I am waiting on Dr. Louanne Skye to sign it so I can fax it

## 2019-08-01 NOTE — Telephone Encounter (Signed)
Rx has been faxed to [facility]

## 2019-08-04 ENCOUNTER — Telehealth: Payer: Self-pay | Admitting: Specialist

## 2019-08-04 NOTE — Telephone Encounter (Signed)
I called and advised patient that this was sent in to [facility] on Friday last week

## 2019-08-04 NOTE — Telephone Encounter (Signed)
Patient called stating that the RX for his Lyrica needs to be sent to [facility] in [place], [place].  He also stated that the RX needs to have 3 refills.  CB#[phone].  Thank you.

## 2019-08-11 ENCOUNTER — Other Ambulatory Visit: Payer: Self-pay | Admitting: Specialist

## 2019-08-11 MED ORDER — HYDROCODONE-ACETAMINOPHEN 7.5-325 MG PO TABS: 1.0000 | ORAL_TABLET | Freq: Four times a day (QID) | ORAL | 0 refills | Status: DC | PRN

## 2019-08-11 NOTE — Telephone Encounter (Signed)
Patient called for refill of hydrocodone. Please send to pharmacy on file. Also patient said he has not received a call about scheduling MRI. Please send refill to pharmacy on file. Patient phone number is [phone].

## 2019-08-11 NOTE — Telephone Encounter (Signed)
Patient called requested a refill of hydrocodone. Please send to pharmacy on file. Patient phone number is [phone].

## 2019-08-11 NOTE — Telephone Encounter (Signed)
I called and spoke with patient, I advised I sent the refill request to Dr. Louanne Skye, and that we did not order an MRI, but that we did order Pain Management. He states that he told the person that answered the phone it was a referral to pain management, not an MRI.   I advised that it can take 6-8 weeks to hear from them and the order was placed on 07/25/2019, and then depending on when the referral was actually sent.  He states that he understands

## 2019-08-11 NOTE — Telephone Encounter (Signed)
This was meant to go to Dr. Louanne Skye.

## 2019-08-27 ENCOUNTER — Telehealth (HOSPITAL_COMMUNITY): Payer: Self-pay | Admitting: Pharmacy Technician

## 2019-08-27 ENCOUNTER — Other Ambulatory Visit: Payer: Self-pay | Admitting: Specialist

## 2019-08-27 MED ORDER — HYDROCODONE-ACETAMINOPHEN 7.5-325 MG PO TABS: 1.0000 | ORAL_TABLET | Freq: Four times a day (QID) | ORAL | 0 refills | Status: DC | PRN

## 2019-08-27 NOTE — Telephone Encounter (Signed)
Pt called stating he would like to see if he could get a refill for his Hydrocodone; pt would like a call back when that's been sent in.   [phone]

## 2019-08-27 NOTE — Telephone Encounter (Signed)
Request sent to Dr. Nitka  

## 2019-09-07 ENCOUNTER — Other Ambulatory Visit (HOSPITAL_COMMUNITY): Payer: Self-pay | Admitting: Cardiology

## 2019-09-09 NOTE — Telephone Encounter (Signed)
Received notification from [employer] that patient has not used his grant in 90 days and needs to use it by 09/22/19 or he will loose it.   Called and spoke with his pharmacy and he has a 30 day supply waiting on him and the grant was billed as well. Left patient a message reminding him to pick up his medication so that his grant does not expire.  Charlann Boxer, CPhT

## 2019-09-11 ENCOUNTER — Other Ambulatory Visit: Payer: Self-pay | Admitting: Specialist

## 2019-09-11 NOTE — Telephone Encounter (Signed)
Pt called stating he needs a refill of his hydrocodone.   [phone]

## 2019-09-11 NOTE — Telephone Encounter (Signed)
Sent request to Dr. Nitka 

## 2019-09-12 ENCOUNTER — Telehealth: Payer: Self-pay | Admitting: Specialist

## 2019-09-12 NOTE — Telephone Encounter (Signed)
Patient called requesting refill of Hydrcodone. Please send to pharmacy on file. Patient phone number is [phone].

## 2019-09-12 NOTE — Telephone Encounter (Signed)
I called patient he states that he has an appt with pain management next week.

## 2019-09-12 NOTE — Telephone Encounter (Signed)
I will no longer fill his narcotic meds beyond 6 more weeks, he needs an appointment with pain management before I fill the next prescription. This is not a surgical condition. jen

## 2019-09-12 NOTE — Telephone Encounter (Signed)
Request has been sent to Dr. Louanne Skye, patient has appt @ pain management next he says

## 2019-09-15 MED ORDER — HYDROCODONE-ACETAMINOPHEN 7.5-325 MG PO TABS: 1.0000 | ORAL_TABLET | Freq: Four times a day (QID) | ORAL | 0 refills | Status: DC | PRN

## 2019-09-16 ENCOUNTER — Other Ambulatory Visit: Payer: Self-pay | Admitting: Anesthesiology

## 2019-09-16 ENCOUNTER — Telehealth: Payer: Self-pay | Admitting: Anesthesiology

## 2019-09-16 ENCOUNTER — Encounter (HOSPITAL_COMMUNITY): Payer: Self-pay | Admitting: Cardiology

## 2019-09-16 ENCOUNTER — Other Ambulatory Visit (HOSPITAL_COMMUNITY): Payer: Self-pay | Admitting: Anesthesiology

## 2019-09-16 ENCOUNTER — Ambulatory Visit (HOSPITAL_COMMUNITY)
Admission: RE | Admit: 2019-09-16 | Discharge: 2019-09-16 | Disposition: A | Discharge status: Home / Self Care | Payer: Medicare HMO | Source: Ambulatory Visit | Attending: Cardiology | Admitting: Cardiology

## 2019-09-16 ENCOUNTER — Other Ambulatory Visit: Payer: Self-pay

## 2019-09-16 VITALS — BP 100/62 | HR 61 | Wt 182.4 lb

## 2019-09-16 DIAGNOSIS — K219 Gastro-esophageal reflux disease without esophagitis: Secondary | ICD-10-CM | POA: Insufficient documentation

## 2019-09-16 DIAGNOSIS — E785 Hyperlipidemia, unspecified: Secondary | ICD-10-CM | POA: Diagnosis not present

## 2019-09-16 DIAGNOSIS — E781 Pure hyperglyceridemia: Secondary | ICD-10-CM | POA: Diagnosis not present

## 2019-09-16 DIAGNOSIS — I5022 Chronic systolic (congestive) heart failure: Secondary | ICD-10-CM

## 2019-09-16 DIAGNOSIS — Z79899 Other long term (current) drug therapy: Secondary | ICD-10-CM | POA: Diagnosis not present

## 2019-09-16 DIAGNOSIS — I428 Other cardiomyopathies: Secondary | ICD-10-CM | POA: Diagnosis not present

## 2019-09-16 DIAGNOSIS — Z7901 Long term (current) use of anticoagulants: Secondary | ICD-10-CM | POA: Diagnosis not present

## 2019-09-16 DIAGNOSIS — N189 Chronic kidney disease, unspecified: Secondary | ICD-10-CM | POA: Insufficient documentation

## 2019-09-16 DIAGNOSIS — I130 Hypertensive heart and chronic kidney disease with heart failure and stage 1 through stage 4 chronic kidney disease, or unspecified chronic kidney disease: Secondary | ICD-10-CM | POA: Insufficient documentation

## 2019-09-16 DIAGNOSIS — G4733 Obstructive sleep apnea (adult) (pediatric): Secondary | ICD-10-CM | POA: Insufficient documentation

## 2019-09-16 DIAGNOSIS — M47812 Spondylosis without myelopathy or radiculopathy, cervical region: Secondary | ICD-10-CM

## 2019-09-16 LAB — BASIC METABOLIC PANEL
Anion gap: 9 (ref 5–15)
BUN: 10 mg/dL (ref 8–23)
CO2: 23 mmol/L (ref 22–32)
Calcium: 9.1 mg/dL (ref 8.9–10.3)
Chloride: 106 mmol/L (ref 98–111)
Creatinine, Ser: 1.02 mg/dL (ref 0.61–1.24)
GFR calc Af Amer: >60 mL/min (ref >60)
GFR calc non Af Amer: >60 mL/min (ref >60)
Glucose, Bld: 99 mg/dL (ref 70–99)
Potassium: 4 mmol/L (ref 3.5–5.1)
Sodium: 138 mmol/L (ref 135–145)

## 2019-09-16 MED ORDER — EPLERENONE 50 MG PO TABS: 50.0000 mg | ORAL_TABLET | Freq: Every day | ORAL | Status: DC

## 2019-09-16 MED ORDER — ENTRESTO 49-51 MG PO TABS
1.0000 | ORAL_TABLET | Freq: Two times a day (BID) | ORAL | 3 refills | Status: DC
Start: 2019-09-16 — End: 2020-03-30

## 2019-09-16 NOTE — Telephone Encounter (Signed)
09/16/19~LVM. MF 

## 2019-09-16 NOTE — Patient Instructions (Signed)
Increase Inspra to 50 mg daily  Decrease Entresto to 49/51 mg Twice daily, you can cut your 97/103 mg tabs in half  Labs done today, your results will be available in MyChart, we will contact you for abnormal readings.  Labs needed again in 10-14 days  Please call our office in October to schedule your follow up appointment  If you have any questions or concerns before your next appointment please send [place] a message through [place] or call our office at [phone].    TO LEAVE A MESSAGE FOR THE NURSE SELECT OPTION 2, PLEASE LEAVE A MESSAGE INCLUDING: . YOUR NAME . DATE OF BIRTH . CALL BACK NUMBER . REASON FOR CALL**this is important as we prioritize the call backs  [place] AS LONG AS YOU CALL BEFORE 4:00 PM  At the [facility], you and your health needs are our priority. As part of our continuing mission to provide you with exceptional heart care, we have created designated Provider Care Teams. These Care Teams include your primary Cardiologist (physician) and Advanced Practice Providers (APPs- Physician Assistants and Nurse Practitioners) who all work together to provide you with the care you need, when you need it.   You may see any of the following providers on your designated Care Team at your next follow up: Marland Kitchen Dr Glori Bickers . Dr Loralie Champagne . Darrick Grinder, NP . Lyda Jester, PA . Audry Riles, PharmD   Please be sure to bring in all your medications bottles to every appointment.

## 2019-09-17 NOTE — Progress Notes (Signed)
Patient ID: [name] [name], male   DOB: 11/19/1950, 69 y.o.   MRN: [id number] Pulmonary: Dr Elsworth Soho PCP: Imagene Riches, NP Cardiology: Dr. Aundra Dubin  69 y.o. with history of nonischemic cardiomyopathy presents for followup of CHF.  Last echo in 12/20 showed EF 40-45%.  This is mildly lower than prior.  He had been drinking more.   He has now cut back on ETOH, none x 3 days.  He has been somewhat confused on med doses, only taking Entresto 1/2 tab 97/103 bid and eplerenone 25 instead of 50.  Occasional lightheadedness with standing, SBP 100 today.  No significant exertional dyspnea or chest pain.  He gets regular exercise.  Main complaint is pain => low back pain and right shoulder pain from chronic rotator cuff tear.  He goes to the gym 3-4 days/week. Weight is down 3 lbs.   Labs (12/09): SPEP negative, HIV negative, transferrin saturation 29%, BNP 24 Labs (9/10): direct LDL 54, HDL 13.8, triglycerides 1470, creatinine 0.8 Labs (05/11/09): BNP 27, WBCs 5.6 Labs (5/11): K 4.6, creatinine 1.2, TGs 238, LDL 108, HDL 34 Labs (8/11): K 4.3, creatinine 1.4, BNP 12.6 Labs (2/12): K 4.1, creatinine 1.7, LDL 102 Labs (8/12): K 4.7, creatinine 1.5 Labs (4/13): LDL 132, HDL 33.4, AST 41, ALT 63 Labs (7/13): K 4, creatinine 1.2, ALT 59, AST 36, HCV negative Labs (1/14): K 4.1, creatinine 1.5 Labs (04/24/14): Cholesterol 222, TGL 265, HDL 37, LDL 169 Labs (4/16): LDL 110, HDl 38 Labs (9/16): LDL 116, HDL 36, TGs 175 Labs (01/2015): K 3.8 Creatinine 1.2 => 1.08, HCT 37 Labs (3/17): creatinine 1.07, BNP 8.9 Labs (6/17): K 4.2, creatinine 1.0 Labs (9/18): TGs 215, LDL 106, HDL 40 Labs (12/18): K 4.4, creatinine 1.09 Labs (12/19): LDL 149 Labs (12/20): LDL 100 Labs (1/21): K 4.4, creatinine 1.15  Allergies (verified):  No Known Drug Allergies  Past Medical History: 1. Nonischemic cardiomyopathy.  The patient had a left heart catheterization done in March 2004 with normal coronaries and then in November  2008, he had a Myoview done that showed an EF of 39% with no ischemia.  Echo in April 2008 showed an EF of 35-40%, moderate diffuse hypokinesis, mild left atrial enlargement, normal RV size and function and essentially normal valves.  The cause of his cardiomyopathy has not been discovered. He drinks but not extremely heavily.  He has never used cocaine and amphetamines.  HIV, SPEP, and ferritin/Fe studies were all negative in 12/09.  Echo (9/10): EF 40%, mild to moderate global HK, mild LAE.  Myoview (5/11): EF 42% with apical thinning but no evidence for ischemia or infarction.  Echo (3/12): EF 45-50%, no significant valvular abnormalities. Echo (1/16) with EF 35-40%, diffuse hypokinesis.  - Cardiac MRI (3/17) with EF 43%, mild diffuse hypokinesis, mid-wall LGE in the mid septum, possible prior viral myocarditis.  - Echo (10/18): EF 50-55%, normal RV size and systolic function.  - Echo (12/20): EF 40-45%, normal RV.  2. Hypertriglyceridemia 3. Rotator cuff tendonitis with a history of bilateral shoulder surgery. 4. Hypertension. 5. History of a back surgery. 6. Knee osteoarthritis, s/p arthroscopy 2010 7. Gastroesophageal reflux disease. EGD (1/12) with erosive esophagitis and gastritis, biopsy positive for H pylori (being treated). 8. Colonoscopy (1/12) with mild diverticulosis and hemorrhoids.  9. Allergic rhinitis 10. Gynecomastia with spironolactone (ok on eplerenone).  11. Probable mild intermittent asthma 12. Elevated LFTs: HCV negative, possibly due to ETOH 13. CKD 14. OSA: CPAP.  15. H/o herniated nucleus pulposus:  s/p surgery 6/17.  Family History: No premature CAD.  Father with prostate cancer Mother with CVA Brother with nonischemic cardiomyopathy, has ICD Grandmother with cancer (unsure what)    Social History: The patient denies smoking.  Lives in [place].  He does not use any drugs. He has never used any illicit drugs other than marijuana, which he smoked occasionally in  the distant past.  Moderate ETOH.He is on disability. He is divorced with two sons.     ROS:  All systems reviewed and negative except as per HPI.    Current Outpatient Medications  Medication Sig Dispense Refill   albuterol (VENTOLIN HFA) 108 (90 Base) MCG/ACT inhaler Inhale 1-2 puffs into the lungs every 6 (six) hours as needed. 18 g 11   ALPRAZolam (XANAX) 0.5 MG tablet Take 0.5 mg by mouth at bedtime.      carvedilol (COREG) 12.5 MG tablet Take 1 tablet by mouth twice daily 180 tablet 3   cetirizine (ZYRTEC) 10 MG tablet Take 10 mg by mouth daily as needed for allergies or rhinitis.     eplerenone (INSPRA) 50 MG tablet Take 1 tablet (50 mg total) by mouth daily.     fluticasone (FLONASE) 50 MCG/ACT nasal spray Place 2 sprays into both nostrils daily as needed for allergies or rhinitis.     HYDROcodone-acetaminophen (NORCO) 7.5-325 MG tablet Take 1 tablet by mouth every 6 (six) hours as needed for moderate pain. 30 tablet 0   pantoprazole (PROTONIX) 40 MG tablet Take 1 tablet (40 mg total) by mouth daily. 90 tablet 3   pregabalin (LYRICA) 100 MG capsule Take 1 capsule (100 mg total) by mouth 2 (two) times daily. 180 capsule 3   pregabalin (LYRICA) 100 MG capsule Take 100 mg by mouth 2 (two) times daily.     rosuvastatin (CRESTOR) 40 MG tablet Take 1 tablet (40 mg total) by mouth daily. 30 tablet 11   sildenafil (VIAGRA) 100 MG tablet Take 100 mg by mouth as needed for erectile dysfunction.      sodium chloride (OCEAN) 0.65 % SOLN nasal spray Place 1 spray into both nostrils as needed for congestion. 15 mL 0   sacubitril-valsartan (ENTRESTO) 49-51 MG Take 1 tablet by mouth 2 (two) times daily. 60 tablet 3   No current facility-administered medications for this encounter.    BP 100/62    Pulse 61    Wt 82.7 kg (182 lb 6.4 oz)    SpO2 98%    BMI 28.57 kg/m  General: NAD Neck: No JVD, no thyromegaly or thyroid nodule.  Lungs: Clear to auscultation bilaterally with normal  respiratory effort. CV: Nondisplaced PMI.  Heart regular S1/S2, no S3/S4, no murmur.  No peripheral edema.  No carotid bruit.  Normal pedal pulses.  Abdomen: Soft, nontender, no hepatosplenomegaly, no distention.  Skin: Intact without lesions or rashes.  Neurologic: Alert and oriented x 3.  Psych: Normal affect. Extremities: No clubbing or cyanosis.  HEENT: Normal.   Assessment/Plan:  1. Chronic systolic heart failure: Nonischemic cardiomyopathy, ECHO 04/2014 with EF down to 35-40%.  Cardiac MRI in 3/17, however, showed EF 43% with mid-wall pattern of LGE in the septum that may be suggestive of prior myocarditis.  Echo in 10/18 showed EF up to 50-55. HIV negative, SPEP negative, no evidence for hemochromatosis. Would also consider familial cardiomyopathy as his brother apparently also has a nonischemic cardiomyopathy and got an ICD.  Most recent echo in 12/20 showed EF down to 40-45%.  He had been  drinking more, this could be the cause of EF fall. NYHA I-II. Volume status stable. Not currently on lasix.  He has now cut back on ETOH.  - Continue to keep ETOH at a minimum.  - Increase eplerenone back to 50 mg daily, BMET today and in 10 days.  - Continue carvedilol 12.5 mg twice a day.  - Continue Entresto 49/51 bid, I do not think that he has BP room right now for 97/103 bid.    2. Hyperlipidemia: He is on Crestor, good lipids in 3/21.  3. OSA: Unable to tolerate CPAP.   Followup in 4 months.   Loralie Champagne 09/17/2019

## 2019-09-24 ENCOUNTER — Other Ambulatory Visit: Payer: Self-pay

## 2019-09-24 MED ORDER — PANTOPRAZOLE SODIUM 40 MG PO TBEC: 40.0000 mg | DELAYED_RELEASE_TABLET | Freq: Every day | ORAL | 3 refills | Status: DC

## 2019-09-25 ENCOUNTER — Other Ambulatory Visit: Payer: Self-pay

## 2019-09-25 ENCOUNTER — Ambulatory Visit (HOSPITAL_COMMUNITY): Payer: Medicare HMO

## 2019-09-25 ENCOUNTER — Ambulatory Visit (HOSPITAL_COMMUNITY)
Admission: RE | Admit: 2019-09-25 | Discharge: 2019-09-25 | Disposition: A | Discharge status: Home / Self Care | Payer: Medicare HMO | Source: Ambulatory Visit | Attending: Internal Medicine | Admitting: Internal Medicine

## 2019-09-25 DIAGNOSIS — I5022 Chronic systolic (congestive) heart failure: Secondary | ICD-10-CM | POA: Insufficient documentation

## 2019-09-25 LAB — BASIC METABOLIC PANEL
Anion gap: 7 (ref 5–15)
BUN: 15 mg/dL (ref 8–23)
CO2: 23 mmol/L (ref 22–32)
Calcium: 9 mg/dL (ref 8.9–10.3)
Chloride: 107 mmol/L (ref 98–111)
Creatinine, Ser: 1.16 mg/dL (ref 0.61–1.24)
GFR calc Af Amer: >60 mL/min (ref >60)
GFR calc non Af Amer: >60 mL/min (ref >60)
Glucose, Bld: 112 mg/dL — ABNORMAL HIGH (ref 70–99)
Potassium: 4.3 mmol/L (ref 3.5–5.1)
Sodium: 137 mmol/L (ref 135–145)

## 2019-09-29 ENCOUNTER — Telehealth (HOSPITAL_COMMUNITY): Payer: Self-pay

## 2019-09-29 NOTE — Telephone Encounter (Signed)
Received a fax requesting medical records from [facility]. Records were successfully faxed to: [phone] ,which was the number provided.. Medical request form will be scanned into patients chart.

## 2019-09-30 ENCOUNTER — Telehealth: Payer: Self-pay | Admitting: Specialist

## 2019-09-30 NOTE — Telephone Encounter (Signed)
Patient called asked if Alyse Low would call him concerning his back. Patient said he went to pain management and he was told they wanted to give him injections in each side of his spine. Patient said he wanted to ask Dr Louanne Skye had he heard anyone doing anything like that?-----please advise

## 2019-09-30 NOTE — Telephone Encounter (Signed)
Patient called asked if [name] [name] would call him concerning his back. Patient said he went to pain management and he was told they wanted to give him injections in each side of his spine. Patient said he wanted to ask Dr Louanne Skye had he heard anyone doing anything like that? The number to contact patient is [phone]

## 2019-10-02 ENCOUNTER — Telehealth: Payer: Self-pay | Admitting: Specialist

## 2019-10-02 ENCOUNTER — Other Ambulatory Visit: Payer: Self-pay

## 2019-10-02 NOTE — Telephone Encounter (Signed)
Patient saw Dr. Hardin Negus and he is wanting to try the RFA, and patient would like to discuss this with Dr. Louanne Skye, I put him on the cancellation list for a sooner appt

## 2019-10-02 NOTE — Telephone Encounter (Signed)
Patient called in wanting to know  & discus what  Dr Louanne Skye wants him to do.

## 2019-10-02 NOTE — Telephone Encounter (Signed)
Patient called needing Rx refilled (Hydrocodone) The number to contact patient is [phone]

## 2019-10-06 ENCOUNTER — Telehealth: Payer: Self-pay | Admitting: Specialist

## 2019-10-06 NOTE — Telephone Encounter (Signed)
Pt called asking for a CB from [facility] in regards to injections and a refill of his pain  medicine.   [phone]

## 2019-10-08 ENCOUNTER — Other Ambulatory Visit: Payer: Self-pay

## 2019-10-08 ENCOUNTER — Ambulatory Visit (HOSPITAL_COMMUNITY)
Admission: RE | Admit: 2019-10-08 | Discharge: 2019-10-08 | Disposition: A | Discharge status: Home / Self Care | Payer: Medicare HMO | Source: Ambulatory Visit | Attending: Anesthesiology | Admitting: Anesthesiology

## 2019-10-08 DIAGNOSIS — M47812 Spondylosis without myelopathy or radiculopathy, cervical region: Secondary | ICD-10-CM | POA: Insufficient documentation

## 2019-10-08 NOTE — Telephone Encounter (Signed)
I called to work patient in next week but he has been sched for 10/09/19, I advised that Dr. Louanne Skye has no plans to continue to refill his pain meds

## 2019-10-09 ENCOUNTER — Encounter: Payer: Self-pay | Admitting: Specialist

## 2019-10-09 ENCOUNTER — Ambulatory Visit (INDEPENDENT_AMBULATORY_CARE_PROVIDER_SITE_OTHER): Payer: Medicare HMO | Admitting: Specialist

## 2019-10-09 VITALS — BP 109/70 | HR 64 | Ht 67.0 in | Wt 182.0 lb

## 2019-10-09 DIAGNOSIS — M25511 Pain in right shoulder: Secondary | ICD-10-CM

## 2019-10-09 DIAGNOSIS — M47812 Spondylosis without myelopathy or radiculopathy, cervical region: Secondary | ICD-10-CM

## 2019-10-09 DIAGNOSIS — S4991XA Unspecified injury of right shoulder and upper arm, initial encounter: Secondary | ICD-10-CM | POA: Diagnosis not present

## 2019-10-09 DIAGNOSIS — M67911 Unspecified disorder of synovium and tendon, right shoulder: Secondary | ICD-10-CM

## 2019-10-09 DIAGNOSIS — M1711 Unilateral primary osteoarthritis, right knee: Secondary | ICD-10-CM

## 2019-10-09 DIAGNOSIS — M5030 Other cervical disc degeneration, unspecified cervical region: Secondary | ICD-10-CM

## 2019-10-09 DIAGNOSIS — M47816 Spondylosis without myelopathy or radiculopathy, lumbar region: Secondary | ICD-10-CM

## 2019-10-09 DIAGNOSIS — M75121 Complete rotator cuff tear or rupture of right shoulder, not specified as traumatic: Secondary | ICD-10-CM

## 2019-10-09 MED ORDER — LIDOCAINE HCL 1 % IJ SOLN
2.0000 mL | INTRAMUSCULAR | Status: AC | PRN
  Administered 2019-10-09: 2 mL

## 2019-10-09 MED ORDER — BUPIVACAINE HCL 0.5 % IJ SOLN
3.0000 mL | INTRAMUSCULAR | Status: AC | PRN
  Administered 2019-10-09: 3 mL via INTRA_ARTICULAR

## 2019-10-09 MED ORDER — METHYLPREDNISOLONE ACETATE 40 MG/ML IJ SUSP
40.0000 mg | INTRAMUSCULAR | Status: AC | PRN
  Administered 2019-10-09: 40 mg via INTRA_ARTICULAR

## 2019-10-09 NOTE — Telephone Encounter (Signed)
In pain management with Dr. Hardin Negus I will not prescribe any narcotics this will be treated with pain management.

## 2019-10-09 NOTE — Progress Notes (Signed)
Office Visit Note   Patient: [name] [name]           Date of Birth: September 21, 1950           MRN: [id number] Visit Date: 10/09/2019              Requested by: Imagene Riches, NP [place],  [place] [address] PCP: Imagene Riches, NP   Assessment & Plan: Visit Diagnoses:  1. Spondylosis without myelopathy or radiculopathy, cervical region   2. Right shoulder pain, unspecified chronicity   3. Tendinopathy of right shoulder   4. Right shoulder injury, initial encounter   5. Degenerative disc disease, cervical   6. Unilateral primary osteoarthritis, right knee     Plan: Avoid overhead lifting and overhead use of the arms. Do not lift greater than 10 lbs. Tylenol ES one every 6-8 hours for pain and inflamation. See Dr. Sharol Given to have arthroscopic right shoulder debridement of the rotator cuff for chronic retracted cuff tear. See Dr. Ernestina Patches to have testing of the neck and lumbar spine for blocking of arthritis joints to determine if radiofrequency ablation of  The articular branches to the joints may be an option for treatment of both your neck and lumbar pain. Continue to follow up with Dr. Hardin Negus for pain control as you have a significant cardiac history and should be treated with medications  Other than NSAIDs for arthritis and joint and disc and arthritic spine changes. The only surgeries that are able to be offerred are extensive fusions of the thoracolumbar spine and of the cervical spine. These are salvage operations that may relieve at best 40-60% of the discomfort and have a high likelihood of requiring further surgery and high likelihood of complication (on the order of 19-41 % complication rate) Home exercise program to perform ROM and stretching exercises. Dr. Hardin Negus for evaluation and pain management control for various arthritities, cardiac condition dictates inability to use NSAIDs.   Follow-Up Instructions: No follow-ups on file.   Orders:  No orders of the  defined types were placed in this encounter.  No orders of the defined types were placed in this encounter.     Procedures: Large Joint Inj: R subacromial bursa on 10/09/2019 4:57 PM Indications: pain Details: 25 G 1.5 in needle, posterior approach  Arthrogram: No  Medications: 40 mg methylPREDNISolone acetate 40 MG/ML; 3 mL bupivacaine 0.5 %; 2 mL lidocaine 1 % Outcome: tolerated well, no immediate complications Procedure, treatment alternatives, risks and benefits explained, specific risks discussed. Consent was given by the patient. Immediately prior to procedure a time out was called to verify the correct patient, procedure, equipment, support staff and site/side marked as required. Patient was prepped and draped in the usual sterile fashion.       Clinical Data: No additional findings.   Subjective: Chief Complaint  Patient presents with  . Lower Back - Follow-up    HPI  Review of Systems   Objective: Vital Signs: BP 109/70 (BP Location: Left Arm, Patient Position: Sitting)   Pulse 64   Ht 5\' 7"  (1.702 m)   Wt 182 lb (82.6 kg)   BMI 28.51 kg/m   Physical Exam  Ortho Exam  Specialty Comments:  No specialty comments available.  Imaging: No results found.   PMFS History: Patient Active Problem List   Diagnosis Date Noted  . Lumbar disc herniation with radiculopathy 09/20/2015    Priority: High    Class: Acute  . Ganglion cyst  of wrist 09/20/2015    Priority: High    Class: Chronic  . OSA (obstructive sleep apnea) 07/22/2014  . Hyperlipidemia 01/08/2013  . Elevated LFTs 01/24/2012  . Chronic systolic heart failure ([place]) 11/11/2010  . Asthma 11/11/2010  . ANAL AND RECTAL POLYP 01/14/2010  . HEARTBURN 01/14/2010  . RECTAL BLEEDING 11/24/2009  . BREAST MASS, LEFT 11/24/2009  . COUGH 05/11/2009  . HYPERTRIGLYCERIDEMIA 12/29/2008  . Chest pain 12/29/2008  . Essential hypertension 03/11/2008  . Nonischemic cardiomyopathy ([place]) 03/11/2008  . Other  and unspecified hyperlipidemia 12/25/2006  . ALCOHOLISM 12/25/2006  . ALLERGIC RHINITIS 12/25/2006  . HYPERGLYCEMIA 12/25/2006  . UROLITHIASIS, HX OF 12/25/2006   Past Medical History:  Diagnosis Date  . Allergic rhinitis   . Anxiety   . Asthma    Probable mild intermittent asthma  . CKD (chronic kidney disease)   . Diverticulosis    a. Colonoscopy (1/12) with mild diverticulosis and hemorrhoids  . Elevated LFTs    HCV negative, possibly due to ETOH  . GERD (gastroesophageal reflux disease)    EGD 1/12 with erosive esophagitis and gastritis, biopsy postiive for H pylori (being  treated)  . Gynecomastia    with spironolactone (ok on eplerenone)  . History of back surgery   . HTN (hypertension)   . Hypertriglyceridemia   . Knee osteoarthritis    s/p arhtroscopy 2010  . Nonischemic cardiomyopathy ([place])    [place] 06/2002 with normal cors; Myoview 11/08 neg for ischemia. Echo 4/08: EF of 35-40%.  Cause of NICM unknown.  Never a heavy drinker, used cocaine or amphetamines. HIV, SPEP, and ferritin/Fe studies were all negative in 12/09. Myoview (5/11): EF 42% with apical thinning but no evidence for ischemia or infarction. Echo (3/12): EF 45-50%, no significant valvular abnormalities.   . Rotator cuff tendonitis    with hx of bilateral shoulder surgery  . Sleep apnea    doesnot use cpap    Family History  Problem Relation Age of Onset  . Stroke Mother   . Prostate cancer Father   . Cardiomyopathy Brother        nonischemic, has icd  . Cirrhosis Brother        alcoholic   . Pancreatitis Sister   . Colon cancer Neg Hx   . Esophageal cancer Neg Hx   . Pancreatic cancer Neg Hx   . Liver disease Neg Hx     Past Surgical History:  Procedure Laterality Date  . ELECTROCARDIOGRAM  09/20/06  . GANGLION CYST EXCISION Right 09/20/2015   Procedure: REMOVAL GANGLION OF WRIST;  Surgeon: Jessy Oto, MD;  Location: [facility];  Service: Orthopedics;  Laterality: Right;  . KNEE ARTHROSCOPY Left     x 2, then partial replacement  . LUMBAR LAMINECTOMY Right 09/20/2015   Procedure: FAR LATERAL APPROACH TO EXCISE HERNIATED NUCLEUS PULPOSUS RIGHT Lumbar 1- Lumbar 2 AND RIGHT Lumbar 2-Lumbar 3, EXCISION OF GANGLION CYST RIGHT VOLAR RADIAL WRIST;  Surgeon: Jessy Oto, MD;  Location: [facility];  Service: Orthopedics;  Laterality: Right;  . MEDIAL PARTIAL KNEE REPLACEMENT Left   . PTCA    . ROTATOR CUFF REPAIR     2R; 3 L  . stress cardiolite  12/15/05  . TRANSTHORACIC ECHOCARDIOGRAM  07/13/06   Social History   Occupational History  . Occupation: Disability  Tobacco Use  . Smoking status: Never Smoker  . Smokeless tobacco: Never Used  . Tobacco comment: denies   Vaping Use  . Vaping Use: Never  used  Substance and Sexual Activity  . Alcohol use: Yes    Alcohol/week: 6.0 standard drinks    Types: 6 Shots of liquor per week    Comment: weekends  . Drug use: No    Comment: Smoked marijuana on occasion in distant past.   . Sexual activity: Not Currently

## 2019-10-09 NOTE — Patient Instructions (Signed)
Avoid overhead lifting and overhead use of the arms. Do not lift greater than 10 lbs. Tylenol ES one every 6-8 hours for pain and inflamation. See Dr. Sharol Given to have arthroscopic right shoulder debridement of the rotator cuff for chronic retracted cuff tear. See Dr. Ernestina Patches to have testing of the neck and lumbar spine for blocking of arthritis joints to determine if radiofrequency ablation of  The articular branches to the joints may be an option for treatment of both your neck and lumbar pain. Continue to follow up with Dr. Hardin Negus for pain control as you have a significant cardiac history and should be treated with medications  Other than NSAIDs for arthritis and joint and disc and arthritic spine changes. The only surgeries that are able to be offerred are extensive fusions of the thoracolumbar spine and of the cervical spine. These are salvage operations that may relieve at best 40-60% of the discomfort and have a high likelihood of requiring further surgery and high likelihood of complication (on the order of 75-79 % complication rate) Home exercise program to perform ROM and stretching exercises.

## 2019-10-24 ENCOUNTER — Telehealth: Payer: Self-pay | Admitting: Orthopedic Surgery

## 2019-10-24 ENCOUNTER — Other Ambulatory Visit: Payer: Self-pay | Admitting: Orthopedic Surgery

## 2019-10-24 MED ORDER — HYDROCODONE-ACETAMINOPHEN 7.5-325 MG PO TABS: 1.0000 | ORAL_TABLET | Freq: Four times a day (QID) | ORAL | 0 refills | Status: DC | PRN

## 2019-10-24 NOTE — Telephone Encounter (Signed)
Pt called asking for a refill of his hydrocodone but wasn't sure wether Dr.Nitka would fill it for him or Dr.Duda.

## 2019-10-24 NOTE — Telephone Encounter (Signed)
Can you advise? 

## 2019-10-24 NOTE — Telephone Encounter (Signed)
Rx sent 

## 2019-10-24 NOTE — Telephone Encounter (Signed)
IC patient advised patient done.

## 2019-10-27 ENCOUNTER — Encounter: Payer: Self-pay | Admitting: Orthopedic Surgery

## 2019-10-27 ENCOUNTER — Ambulatory Visit (INDEPENDENT_AMBULATORY_CARE_PROVIDER_SITE_OTHER): Payer: Medicare HMO | Admitting: Orthopedic Surgery

## 2019-10-27 ENCOUNTER — Other Ambulatory Visit: Payer: Self-pay

## 2019-10-27 VITALS — Ht 67.0 in | Wt 182.0 lb

## 2019-10-27 DIAGNOSIS — M75121 Complete rotator cuff tear or rupture of right shoulder, not specified as traumatic: Secondary | ICD-10-CM

## 2019-10-27 NOTE — Progress Notes (Signed)
Office Visit Note   Patient: [name] [name]           Date of Birth: 05/28/1950           MRN: [id number] Visit Date: 10/27/2019              Requested by: Imagene Riches, NP [place],  [place] [address] PCP: Imagene Riches, NP  Chief Complaint  Patient presents with   Right Shoulder - Follow-up    S/p injection 07/08/19      HPI: Patient is a 69 year old gentleman who presents with persistent right shoulder pain.  He states he is status post an injection about a week ago which provided very minimal relief.  Patient has had 2 surgeries on the right shoulder 3 surgeries on the left shoulder.  MRI scan from November shows a complete retracted rotator cuff tear on the right.  MRI scan of the cervical spine from June shows foraminal stenosis on the right at C6-7.  Assessment & Plan: Visit Diagnoses:  1. Nontraumatic complete tear of right rotator cuff     Plan: Discussed operative versus nonoperative treatment patient states she would like to proceed with arthroscopic debridement of the right shoulder.  Risk and benefits were discussed including persistent pain patient states he understands wished to proceed at this time.  Patient states he will continue to follow-up with Dr. Hardin Negus for pain management and he is going to follow-up with Dr. Ernestina Patches for evaluation for epidural steroid injections.  Follow-Up Instructions: Return if symptoms worsen or fail to improve.   Ortho Exam  Patient is alert, oriented, no adenopathy, well-dressed, normal affect, normal respiratory effort. Examination patient has abduction flexion of the right shoulder to 90 degrees he has internal and external rotation of 45 degrees he has pain with Neer and Hawkins impingement test pain with a drop arm test.  Imaging: No results found. No images are attached to the encounter.  Labs: No results found for: HGBA1C, ESRSEDRATE, CRP, LABURIC, REPTSTATUS, GRAMSTAIN, CULT, LABORGA   Lab Results    Component Value Date   ALBUMIN 4.0 06/11/2019   ALBUMIN 3.9 03/29/2018   ALBUMIN 3.8 10/22/2017    No results found for: MG No results found for: VD25OH  No results found for: PREALBUMIN CBC EXTENDED Latest Ref Rng & Units 03/12/2019 12/25/2016 09/21/2015  WBC 4.0 - 10.5 K/uL 8.0 4.9 10.8(H)  RBC 4.22 - 5.81 MIL/uL 4.31 4.53 4.05(L)  HGB 13.0 - 17.0 g/dL 14.0 14.9 12.3(L)  HCT 39 - 52 % 41.8 43.2 36.0(L)  PLT 150 - 400 K/uL 130(L) 129(L) 145(L)  NEUTROABS 1.7 - 7.7 K/uL - - -  LYMPHSABS 0.7 - 4.0 K/uL - - -     Body mass index is 28.51 kg/m.  Orders:  No orders of the defined types were placed in this encounter.  No orders of the defined types were placed in this encounter.    Procedures: No procedures performed  Clinical Data: No additional findings.  ROS:  All other systems negative, except as noted in the HPI. Review of Systems  Objective: Vital Signs: Ht 5\' 7"  (1.702 m)    Wt 182 lb (82.6 kg)    BMI 28.51 kg/m   Specialty Comments:  No specialty comments available.  PMFS History: Patient Active Problem List   Diagnosis Date Noted   Lumbar disc herniation with radiculopathy 09/20/2015    Class: Acute   Ganglion cyst of wrist 09/20/2015  Class: Chronic   OSA (obstructive sleep apnea) 07/22/2014   Hyperlipidemia 01/08/2013   Elevated LFTs 12/75/1700   Chronic systolic heart failure ([place]) 11/11/2010   Asthma 11/11/2010   ANAL AND RECTAL POLYP 01/14/2010   HEARTBURN 01/14/2010   RECTAL BLEEDING 11/24/2009   BREAST MASS, LEFT 11/24/2009   COUGH 05/11/2009   HYPERTRIGLYCERIDEMIA 12/29/2008   Chest pain 12/29/2008   Essential hypertension 03/11/2008   Nonischemic cardiomyopathy ([place]) 03/11/2008   Other and unspecified hyperlipidemia 12/25/2006   ALCOHOLISM 12/25/2006   ALLERGIC RHINITIS 12/25/2006   HYPERGLYCEMIA 12/25/2006   UROLITHIASIS, HX OF 12/25/2006   Past Medical History:  Diagnosis Date   Allergic rhinitis     Anxiety    Asthma    Probable mild intermittent asthma   CKD (chronic kidney disease)    Diverticulosis    a. Colonoscopy (1/12) with mild diverticulosis and hemorrhoids   Elevated LFTs    HCV negative, possibly due to ETOH   GERD (gastroesophageal reflux disease)    EGD 1/12 with erosive esophagitis and gastritis, biopsy postiive for H pylori (being  treated)   Gynecomastia    with spironolactone (ok on eplerenone)   History of back surgery    HTN (hypertension)    Hypertriglyceridemia    Knee osteoarthritis    s/p arhtroscopy 2010   Nonischemic cardiomyopathy ([place])    [place] 06/2002 with normal cors; Myoview 11/08 neg for ischemia. Echo 4/08: EF of 35-40%.  Cause of NICM unknown.  Never a heavy drinker, used cocaine or amphetamines. HIV, SPEP, and ferritin/Fe studies were all negative in 12/09. Myoview (5/11): EF 42% with apical thinning but no evidence for ischemia or infarction. Echo (3/12): EF 45-50%, no significant valvular abnormalities.    Rotator cuff tendonitis    with hx of bilateral shoulder surgery   Sleep apnea    doesnot use cpap    Family History  Problem Relation Age of Onset   Stroke Mother    Prostate cancer Father    Cardiomyopathy Brother        nonischemic, has icd   Cirrhosis Brother        alcoholic    Pancreatitis Sister    Colon cancer Neg Hx    Esophageal cancer Neg Hx    Pancreatic cancer Neg Hx    Liver disease Neg Hx     Past Surgical History:  Procedure Laterality Date   ELECTROCARDIOGRAM  09/20/06   GANGLION CYST EXCISION Right 09/20/2015   Procedure: REMOVAL GANGLION OF WRIST;  Surgeon: Jessy Oto, MD;  Location: [facility];  Service: Orthopedics;  Laterality: Right;   KNEE ARTHROSCOPY Left    x 2, then partial replacement   LUMBAR LAMINECTOMY Right 09/20/2015   Procedure: FAR LATERAL APPROACH TO EXCISE HERNIATED NUCLEUS PULPOSUS RIGHT Lumbar 1- Lumbar 2 AND RIGHT Lumbar 2-Lumbar 3, EXCISION OF GANGLION CYST RIGHT VOLAR  RADIAL WRIST;  Surgeon: Jessy Oto, MD;  Location: [facility];  Service: Orthopedics;  Laterality: Right;   MEDIAL PARTIAL KNEE REPLACEMENT Left    PTCA     ROTATOR CUFF REPAIR     2R; 3 L   stress cardiolite  12/15/05   TRANSTHORACIC ECHOCARDIOGRAM  07/13/06   Social History   Occupational History   Occupation: Disability  Tobacco Use   Smoking status: Never Smoker   Smokeless tobacco: Never Used   Tobacco comment: denies   Vaping Use   Vaping Use: Never used  Substance and Sexual Activity   Alcohol use: Yes  Alcohol/week: 6.0 standard drinks    Types: 6 Shots of liquor per week    Comment: weekends   Drug use: No    Comment: Smoked marijuana on occasion in distant past.    Sexual activity: Not Currently

## 2019-10-31 ENCOUNTER — Telehealth: Payer: Self-pay | Admitting: Orthopedic Surgery

## 2019-10-31 ENCOUNTER — Other Ambulatory Visit: Payer: Self-pay

## 2019-10-31 NOTE — Telephone Encounter (Signed)
Will hold for Monday with Dr. Sharol Given

## 2019-10-31 NOTE — Telephone Encounter (Signed)
[name] [name] scheduled surgery for his right shoulder rotator cuff tear.  During our discussion he requested a refill of Hydrocodone 7.5.  He uses the [facility] on [address].

## 2019-11-03 ENCOUNTER — Other Ambulatory Visit: Payer: Self-pay | Admitting: Physician Assistant

## 2019-11-03 NOTE — Telephone Encounter (Signed)
I called sw pt to advise that if he receives pain medication now it will not help with post operative pain once he has his surgery 12/03/19. He will become used to the medication and will not control his pain after procedure. He voiced understanding and will call with any other questions.

## 2019-11-03 NOTE — Telephone Encounter (Signed)
Per Dr. Jess Barters last not patient was to follow up with Dr. Hardin Negus for pain management

## 2019-11-10 ENCOUNTER — Ambulatory Visit: Payer: Medicare HMO | Admitting: Specialist

## 2019-11-11 ENCOUNTER — Other Ambulatory Visit: Payer: Self-pay | Admitting: Specialist

## 2019-11-11 NOTE — Telephone Encounter (Signed)
Patient called asked for 3 sets of refills from [facility] in [place]. Script is pregabalin. Patient phone number is [phone].

## 2019-11-12 ENCOUNTER — Other Ambulatory Visit: Payer: Self-pay | Admitting: Physician Assistant

## 2019-11-19 ENCOUNTER — Ambulatory Visit: Payer: Medicare HMO | Admitting: Specialist

## 2019-11-26 ENCOUNTER — Ambulatory Visit: Payer: Self-pay

## 2019-11-26 ENCOUNTER — Other Ambulatory Visit: Payer: Self-pay

## 2019-11-26 ENCOUNTER — Encounter: Payer: Self-pay | Admitting: Physical Medicine and Rehabilitation

## 2019-11-26 ENCOUNTER — Ambulatory Visit (INDEPENDENT_AMBULATORY_CARE_PROVIDER_SITE_OTHER): Payer: Medicare HMO | Admitting: Physical Medicine and Rehabilitation

## 2019-11-26 VITALS — BP 98/69 | HR 75

## 2019-11-26 DIAGNOSIS — M961 Postlaminectomy syndrome, not elsewhere classified: Secondary | ICD-10-CM

## 2019-11-26 DIAGNOSIS — G8929 Other chronic pain: Secondary | ICD-10-CM | POA: Diagnosis not present

## 2019-11-26 DIAGNOSIS — M542 Cervicalgia: Secondary | ICD-10-CM

## 2019-11-26 DIAGNOSIS — M47812 Spondylosis without myelopathy or radiculopathy, cervical region: Secondary | ICD-10-CM | POA: Diagnosis not present

## 2019-11-26 DIAGNOSIS — M47816 Spondylosis without myelopathy or radiculopathy, lumbar region: Secondary | ICD-10-CM

## 2019-11-26 DIAGNOSIS — M25511 Pain in right shoulder: Secondary | ICD-10-CM

## 2019-11-26 DIAGNOSIS — M5412 Radiculopathy, cervical region: Secondary | ICD-10-CM

## 2019-11-26 MED ORDER — METHYLPREDNISOLONE ACETATE 80 MG/ML IJ SUSP
40.0000 mg | Freq: Once | INTRAMUSCULAR | Status: AC
  Administered 2019-11-26: 40 mg

## 2019-11-26 NOTE — Procedures (Signed)
Diagnostic Cervical Facet Joint Nerve Block with Fluoroscopic Guidance  Patient: [name] [name]      Date of Birth: 03/13/1951 MRN: [id number] PCP: Imagene Riches, NP      Visit Date: 11/26/2019   Universal Protocol:    Date/Time: 08/18/212:35 PM  Consent Given By: the patient  Position: LATERAL  Additional Comments: Vital signs were monitored before and after the procedure. Patient was prepped and draped in the usual sterile fashion. The correct patient, procedure, and site was verified.   Injection Procedure Details:  Procedure Site One Meds Administered:  Meds ordered this encounter  Medications  . methylPREDNISolone acetate (DEPO-MEDROL) injection 40 mg     Laterality: Right  Location/Site:  C3-4 C5-6 C6-7  Needle size: 25 G  Needle type: spinal needle  Needle Placement: Articular Pillar  Findings:  -Contrast Used: 0.5 mL iohexol 180 mg iodine/mL   -Comments: Excellent flow of contrast across the articular pillars without intravascular flow  Procedure Details: The fluoroscope beam was manipulated to achieve the best "true" lateral view possible by squaring off the endplates with cranial and caudal tilt and using varying obliquity to achieve the a view with the longest length of spinous process.  The region overlying the facet joints mentioned above were then localized under fluoroscopic visualization.  The needle was inserted down to the center of the "trapezoid" outline of the facet joint lateral mass. Bi-planar images were used for confirming placement and spot radiographs were documented.  A 0.25 ml volume of Omnipaque-240 was injected to look for vascular uptake. A 0.5 ml. volume of the anesthetic solution was injected onto the target. This procedure was repeated for each medial branch nerve injected.  Prior to the procedure, the patient was given a Pain Diary which was completed for baseline measurements.  After the procedure, the patient rated their pain  every 30 minutes and will continue rating at this frequency for a total of 5 hours.  The patient has been asked to complete the Diary and return to [place] by mail, fax or hand delivered as soon as possible.   Additional Comments:  The patient tolerated the procedure well Dressing: Band-Aid    Post-procedure details: Patient was observed during the procedure. Post-procedure instructions were reviewed.  Patient left the clinic in stable condition.

## 2019-11-26 NOTE — Progress Notes (Signed)
Pt states pain in his neck travels down the right shoulder. Pt states it a pain that always there. Pt states he take pain pills for relief. Pt has hx of lumbar  inj on 04/01/19 didn't help with the pain. Right rotator cuff shoulder scheduled for 8/25 but he wants to delay, Nitk shoulder injection helped but [place] PA not helped. Did ask [facility] about injection.  Numeric Pain Rating Scale and Functional Assessment Average Pain 6   In the last MONTH (on 0-10 scale) has pain interfered with the following?  1. General activity like being  able to carry out your everyday physical activities such as walking, climbing stairs, carrying groceries, or moving a chair?  Rating(6)   +Driver, -BT, -Dye Allergies.

## 2019-11-28 ENCOUNTER — Telehealth: Payer: Self-pay | Admitting: Specialist

## 2019-11-28 NOTE — Telephone Encounter (Signed)
Seth Bake from [facility] called. Says they need a RX for Lyrica for patient faxed to [phone]. Her CB number is [phone]

## 2019-11-28 NOTE — Telephone Encounter (Signed)
Ok for this? 

## 2019-12-02 NOTE — Progress Notes (Addendum)
There is a note from Fransisca Connors, dated 11/27/19- after numerous calls, patient called Baker Janus back and said that he is post poing surgery until OCT.  I called and left a message and sent my phone number to Mr.Barrilleaux.

## 2019-12-03 ENCOUNTER — Ambulatory Visit (HOSPITAL_COMMUNITY): Admission: RE | Admit: 2019-12-03 | Payer: Medicare HMO | Source: Home / Self Care | Admitting: Orthopedic Surgery

## 2019-12-03 ENCOUNTER — Encounter (HOSPITAL_COMMUNITY): Admission: RE | Payer: Self-pay | Source: Home / Self Care

## 2019-12-03 SURGERY — ARTHROSCOPY, SHOULDER
Anesthesia: General | Site: Shoulder | Laterality: Right

## 2019-12-21 ENCOUNTER — Encounter: Payer: Self-pay | Admitting: Physical Medicine and Rehabilitation

## 2019-12-21 NOTE — Progress Notes (Signed)
CLEARANCE CHENAULT - 69 y.o. male MRN [id number]  Date of birth: 12-02-1950  Office Visit Note: Visit Date: 11/26/2019 PCP: Imagene Riches, NP Referred by: Imagene Riches, NP  Subjective: Chief Complaint  Patient presents with  . Right Shoulder - Pain  . Neck - Pain   HPI: [name] [name] is a 69 y.o. male who comes in today At the request of Dr. Basil Dess for evaluation and management as well as fluoroscopically guided injection of the cervical spine.  The evaluation and management is for both cervicalgia and lumbar back pain right more than left.  I have seen [name] [name] in the past and those notes can be reviewed.  I also reviewed all of Dr. Otho Ket notes.  This represents a chronic chronic neck and chronic low back pain with severe exacerbation over the last several months despite medication management including opioids as well as lumbar surgery and physical therapy.  He reports right shoulder pain as well and there is some talk but this may be from his right rotator cuff.  He has surgery scheduled for this on 825 but he is thinking about delaying the surgery.  By point of fact he did have a subacromial injection by Dr. Louanne Skye that did help quite a bit but the subacromial injection from Dr. Jess Barters physician assistant [facility] Persons did not help.  In terms of his neck pain and shoulder pain this is very complicated with both Dr. Louanne Skye and Dr. Sharol Given treating neck and shoulder.  He has had inconclusive relief with shoulder injection.  He has significant arthritis on the right particularly at C3-4 and C4-5 and C5-6.  Dr. Sharol Given had suggested diagnostic blocks at this level.  Another point of fact would be if you do blocks at C3-4 C4-5 and C5-6 with the nature of medial branches you would ultimately end up blocking all 4 of those joints in between.  He denies any left-sided complaints.  He has no radicular pain down the arm.  He has also had fairly recent cervical MRI performed.  This was ordered by Dr. Nicholaus Bloom.  This further complicates the issue and the fact that the patient is in pain management with Dr. Hardin Negus.  Dr. Shawna Orleans does a very good job with pain management does do procedures.  The patient said he did talk to Dr. Hardin Negus about the procedure and that Dr. Hardin Negus that he does do these procedures as well.  In terms of his lower back, the patient is mainly having lower back pain some referral into the buttocks but nothing down the legs.  He has had prior left lateral approach discectomy.  No prior lumbar fusion.  He has had no focal weakness.  Pain is worse with going from sit to stand and standing.  Some pain with walking.  No focal weakness no bowel or bladder changes etc.  His symptoms again are more right than left bilateral.  MRI again reviewed with him today using spine models and imaging.  He actually does have some stenosis at L4-5 and multilevel facet arthropathy and degenerative endplate changes.  Prior epidural injection was not very beneficial in December.  Dr. Louanne Skye had suggested medial branch blocks of all the lower lumbar segments.  I believe this was denied by insurance because in point of fact really can only do 2 levels of the spine in a specific spine region at a time and really need to do double diagnostic blocks of there are limits placed  from an insurance standpoint.  Review of Systems  Musculoskeletal: Positive for back pain, joint pain and neck pain.  All other systems reviewed and are negative.  Otherwise per HPI.  Assessment & Plan: Visit Diagnoses:  1. Cervical spondylosis without myelopathy   2. Cervicalgia   3. Chronic right shoulder pain   4. Spondylosis without myelopathy or radiculopathy, lumbar region   5. Chronic right-sided low back pain without sciatica   6. Post laminectomy syndrome     Plan: Findings:  1.  Chronic history of neck pain and right shoulder pain with some evidence of rotator cuff injury with rotator cuff surgery which was scheduled  for a few weeks from now that he is going to delay.  Chronic history of neck pain with exacerbation severe with 6-7 out of 10 pain despite opioid medication provided by Dr. Nicholaus Bloom.  Course is complicated by pain management with Dr. Hardin Negus doing these injections as well.  I have suggested to the patient that Dr. Hardin Negus would probably like to manage his pain from an entirety standpoint including the procedures and the medication.  Nonetheless he is here today with approval for diagnostic medial branch blocks at C3-4 and C5-6 and C6-7.  We will perform this today if he does get good relief could look at double diagnostic block whether it is Dr. Hardin Negus or myself.  And then could look at radiofrequency ablation.  He has had all other manner of conservative care exhausted.  2.  Lumbar spondylosis and degenerative disc disease and chronic back pain requiring opioid management.  He has symptoms consistent with facet mediated low back pain but could also be degenerative disc type pain as well as pain from stenosis.  He has no radicular complaints.  Epidural injection was not very beneficial diagnostically.  He has had prior lumbar left lateral discectomy.  Really no way to do diagnostic blocks of the whole entire lumbar spine and it does not make a lot of sense to do that.  Dr. Louanne Skye had requested this but insurance wise we have to limit to 2-3 levels of a certain spine segment.  I would recommend in the future L4-5 and L5-S1 medial branch blocks diagnostically to see how he does.  Again could look at ablation.  If surgical fusion is not performed patient may be a candidate for spinal cord stimulator trial.  Again this could be done by Dr. Hardin Negus.    Meds & Orders:  Meds ordered this encounter  Medications  . methylPREDNISolone acetate (DEPO-MEDROL) injection 40 mg    Orders Placed This Encounter  Procedures  . Facet Injection  . XR C-ARM NO REPORT    Follow-up: No follow-ups on file.    Procedures: No procedures performed  Diagnostic Cervical Facet Joint Nerve Block with Fluoroscopic Guidance  Patient: [name] [name]      Date of Birth: [birth year] MRN: [id number] PCP: Imagene Riches, NP      Visit Date: 11/26/2019   Universal Protocol:    Date/Time: 08/18/212:35 PM  Consent Given By: the patient  Position: LATERAL  Additional Comments: Vital signs were monitored before and after the procedure. Patient was prepped and draped in the usual sterile fashion. The correct patient, procedure, and site was verified.   Injection Procedure Details:  Procedure Site One Meds Administered:  Meds ordered this encounter  Medications  . methylPREDNISolone acetate (DEPO-MEDROL) injection 40 mg     Laterality: Right  Location/Site:  C3-4 C5-6 C6-7  Needle size: 25  G  Needle type: spinal needle  Needle Placement: Articular Pillar  Findings:  -Contrast Used: 0.5 mL iohexol 180 mg iodine/mL   -Comments: Excellent flow of contrast across the articular pillars without intravascular flow  Procedure Details: The fluoroscope beam was manipulated to achieve the best "true" lateral view possible by squaring off the endplates with cranial and caudal tilt and using varying obliquity to achieve the a view with the longest length of spinous process.  The region overlying the facet joints mentioned above were then localized under fluoroscopic visualization.  The needle was inserted down to the center of the "trapezoid" outline of the facet joint lateral mass. Bi-planar images were used for confirming placement and spot radiographs were documented.  A 0.25 ml volume of Omnipaque-240 was injected to look for vascular uptake. A 0.5 ml. volume of the anesthetic solution was injected onto the target. This procedure was repeated for each medial branch nerve injected.  Prior to the procedure, the patient was given a Pain Diary which was completed for baseline measurements.  After the  procedure, the patient rated their pain every 30 minutes and will continue rating at this frequency for a total of 5 hours.  The patient has been asked to complete the Diary and return to [place] by mail, fax or hand delivered as soon as possible.   Additional Comments:  The patient tolerated the procedure well Dressing: Band-Aid    Post-procedure details: Patient was observed during the procedure. Post-procedure instructions were reviewed.  Patient left the clinic in stable condition.      Clinical History: MRI CERVICAL SPINE WITHOUT CONTRAST    TECHNIQUE:  Multiplanar, multisequence MR imaging of the cervical spine was  performed. No intravenous contrast was administered.    COMPARISON: None.    FINDINGS:  Alignment: Physiologic.    Vertebrae: No fracture, evidence of discitis, or bone lesion.  Endplate degenerative changes at C5-6 and C6-7.    Cord: Normal signal and morphology.    Posterior Fossa, vertebral arteries, paraspinal tissues: Negative.    Disc levels:    C2-3: No spinal canal stenosis. Uncovertebral and facet degenerative  changes resulting in mild bilateral neural foraminal narrowing.    C3-4: No spinal canal stenosis. Uncovertebral and facet degenerative  changes resulting in moderate bilateral neural foraminal narrowing.    C4-5: Minimal posterior disc protrusion without spinal canal  stenosis. Uncovertebral and facet degenerative change in resulting  in mild-to-moderate right and mild left neural foraminal narrowing.    C5-6: Loss of disc height, posterior disc osteophyte complex causing  small indentation of the thecal sac without significant spinal canal  stenosis. Uncovertebral and facet degenerative changes sulci in  moderate bilateral neural foraminal narrowing.    C6-7: Loss of disc height, small posterior disc osteophyte complex  causing indentation of the thecal sac without significant spinal  canal stenosis. Uncovertebral and  facet degenerative changes  resulting in moderate to severe right and mild left neural foraminal  narrowing.    C7-T1: No spinal canal or neural foraminal stenosis.    IMPRESSION:  1. Multilevel degenerative changes of the cervical spine as  described above.  2. No significant spinal canal stenosis at any level.  3. Moderate to severe right neural foraminal stenosis at C6-7.  Moderate bilateral neural foraminal narrowing at C3-4 and C5-6.      Electronically Signed  By: Pedro Earls M.D.  On: 10/08/2019 16:25 ---  MRI LUMBAR SPINE WITHOUT CONTRAST  TECHNIQUE: Multiplanar, multisequence MR imaging  of the lumbar spine was performed. No intravenous contrast was administered.  COMPARISON:  Lumbar spine radiograph 01/08/2019 and lumbar MRI from 05/03/2015  FINDINGS: Segmentation: The lowest lumbar type non-rib-bearing vertebra is labeled as L5.  Alignment: 3 mm degenerative retrolisthesis at L2-3 and 4 mm degenerative retrolisthesis at L5-S1.  L2-3: Mild displacement of the right L2 nerve in the lateral extraforaminal space due to intervertebral spurring. Borderline central narrowing of the thecal sac mild disc bulge. Impingement at this level is slightly improved compared to the prior exam.  L3-4: Moderate to prominent central narrowing of the thecal sac with displacement of the L3 nerves in the lateral extraforaminal space, right greater than left, due to disc bulge, right foraminal and lateral extraforaminal disc protrusion, short pedicles, and degenerative facet arthropathy. Similar impingement to 05/03/2015.  L4-5: Prominent central narrowing of the thecal sac with mild bilateral foraminal stenosis and moderate left subarticular lateral recess stenosis due to disc bulge, left paracentral disc protrusion extending into the neural foramen, intervertebral spurring, short pedicles, and facet arthropathy. Cross-sectional area of the  thecal sac 0.4 cm^2. Impingement has worsened compared to 05/03/2015.  L5-S1: Moderate left subarticular lateral recess stenosis with mild left foraminal stenosis due to intervertebral spurring, left lateral recess disc protrusion, and facet arthropathy. Prior left laminectomy noted. Degree of impingement similar to 05/03/15.  IMPRESSION: 1. Lumbar spondylosis and degenerative disc disease, causing prominent impingement at L4-5; moderate to prominent impingement at L3-4; moderate impingement at L5-S1; and mild impingement at L1-2 and L2-3, as detailed above. The impingement is worsened at the L4-5 level.   Electronically Signed   By: Van Clines M.D.   On: 02/10/2019 16:24   He reports that he has never smoked. He has never used smokeless tobacco. No results for input(s): HGBA1C, LABURIC in the last 8760 hours.  Objective:  VS:  HT:    WT:   BMI:     BP:98/69  HR:75bpm  TEMP: ( )  RESP:  Physical Exam Vitals and nursing note reviewed.  Constitutional:      General: He is not in acute distress.    Appearance: Normal appearance. He is not ill-appearing.  HENT:     Head: Normocephalic and atraumatic.     Right Ear: External ear normal.     Left Ear: External ear normal.  Eyes:     Extraocular Movements: Extraocular movements intact.  Cardiovascular:     Rate and Rhythm: Normal rate.     Pulses: Normal pulses.  Abdominal:     General: There is no distension.     Palpations: Abdomen is soft.  Musculoskeletal:        General: No signs of injury.     Cervical back: Neck supple. Tenderness present. No rigidity.     Right lower leg: No edema.     Left lower leg: No edema.     Comments: Patient has good strength in the upper extremities with 5 out of 5 strength in wrist extension long finger flexion APB.  No intrinsic hand muscle atrophy.  Negative Hoffmann's test.  Patient with concordant neck pain with rotation and extension to the right.  Lumbar exam shows pain  with extension and facet loading.  No pain across the greater trochanters or with hip rotation.  Good distal strength no clonus.  Negative slump test.    Lymphadenopathy:     Cervical: No cervical adenopathy.  Skin:    Findings: No erythema or rash.  Neurological:  General: No focal deficit present.     Mental Status: He is alert and oriented to person, place, and time.     Sensory: No sensory deficit.     Motor: No weakness or abnormal muscle tone.     Coordination: Coordination normal.  Psychiatric:        Mood and Affect: Mood normal.        Behavior: Behavior normal.     Ortho Exam  Imaging: No results found.  Past Medical/Family/Surgical/Social History: Medications & Allergies reviewed per EMR, new medications updated. Patient Active Problem List   Diagnosis Date Noted  . Lumbar disc herniation with radiculopathy 09/20/2015    Class: Acute  . Ganglion cyst of wrist 09/20/2015    Class: Chronic  . OSA (obstructive sleep apnea) 07/22/2014  . Hyperlipidemia 01/08/2013  . Elevated LFTs 01/24/2012  . Chronic systolic heart failure ([place]) 11/11/2010  . Asthma 11/11/2010  . ANAL AND RECTAL POLYP 01/14/2010  . HEARTBURN 01/14/2010  . RECTAL BLEEDING 11/24/2009  . BREAST MASS, LEFT 11/24/2009  . COUGH 05/11/2009  . HYPERTRIGLYCERIDEMIA 12/29/2008  . Chest pain 12/29/2008  . Essential hypertension 03/11/2008  . Nonischemic cardiomyopathy ([place]) 03/11/2008  . Other and unspecified hyperlipidemia 12/25/2006  . ALCOHOLISM 12/25/2006  . ALLERGIC RHINITIS 12/25/2006  . HYPERGLYCEMIA 12/25/2006  . UROLITHIASIS, HX OF 12/25/2006   Past Medical History:  Diagnosis Date  . Allergic rhinitis   . Anxiety   . Asthma    Probable mild intermittent asthma  . CKD (chronic kidney disease)   . Diverticulosis    a. Colonoscopy (1/12) with mild diverticulosis and hemorrhoids  . Elevated LFTs    HCV negative, possibly due to ETOH  . GERD (gastroesophageal reflux disease)    EGD  1/12 with erosive esophagitis and gastritis, biopsy postiive for H pylori (being  treated)  . Gynecomastia    with spironolactone (ok on eplerenone)  . History of back surgery   . HTN (hypertension)   . Hypertriglyceridemia   . Knee osteoarthritis    s/p arhtroscopy 2010  . Nonischemic cardiomyopathy ([place])    [place] 06/2002 with normal cors; Myoview 11/08 neg for ischemia. Echo 4/08: EF of 35-40%.  Cause of NICM unknown.  Never a heavy drinker, used cocaine or amphetamines. HIV, SPEP, and ferritin/Fe studies were all negative in 12/09. Myoview (5/11): EF 42% with apical thinning but no evidence for ischemia or infarction. Echo (3/12): EF 45-50%, no significant valvular abnormalities.   . Rotator cuff tendonitis    with hx of bilateral shoulder surgery  . Sleep apnea    doesnot use cpap   Family History  Problem Relation Age of Onset  . Stroke Mother   . Prostate cancer Father   . Cardiomyopathy Brother        nonischemic, has icd  . Cirrhosis Brother        alcoholic   . Pancreatitis Sister   . Colon cancer Neg Hx   . Esophageal cancer Neg Hx   . Pancreatic cancer Neg Hx   . Liver disease Neg Hx    Past Surgical History:  Procedure Laterality Date  . ELECTROCARDIOGRAM  09/20/06  . GANGLION CYST EXCISION Right 09/20/2015   Procedure: REMOVAL GANGLION OF WRIST;  Surgeon: Jessy Oto, MD;  Location: [facility];  Service: Orthopedics;  Laterality: Right;  . KNEE ARTHROSCOPY Left    x 2, then partial replacement  . LUMBAR LAMINECTOMY Right 09/20/2015   Procedure: FAR LATERAL APPROACH TO EXCISE HERNIATED NUCLEUS  PULPOSUS RIGHT Lumbar 1- Lumbar 2 AND RIGHT Lumbar 2-Lumbar 3, EXCISION OF GANGLION CYST RIGHT VOLAR RADIAL WRIST;  Surgeon: Jessy Oto, MD;  Location: [facility];  Service: Orthopedics;  Laterality: Right;  . MEDIAL PARTIAL KNEE REPLACEMENT Left   . PTCA    . ROTATOR CUFF REPAIR     2R; 3 L  . stress cardiolite  12/15/05  . TRANSTHORACIC ECHOCARDIOGRAM  07/13/06   Social History    Occupational History  . Occupation: Disability  Tobacco Use  . Smoking status: Never Smoker  . Smokeless tobacco: Never Used  . Tobacco comment: denies   Vaping Use  . Vaping Use: Never used  Substance and Sexual Activity  . Alcohol use: Yes    Alcohol/week: 6.0 standard drinks    Types: 6 Shots of liquor per week    Comment: weekends  . Drug use: No    Comment: Smoked marijuana on occasion in distant past.   . Sexual activity: Not Currently

## 2019-12-23 ENCOUNTER — Other Ambulatory Visit: Payer: Self-pay | Admitting: Specialist

## 2019-12-31 ENCOUNTER — Telehealth: Payer: Self-pay

## 2019-12-31 NOTE — Telephone Encounter (Signed)
I called and spoke with patient, he states that he had to pay for the Lyrica this time and he is not use to paying for it. I advised that Per Dr. Louanne Skye he requested that I call Dr. Hardin Negus office and see if they will start rxing it for, I left a message on the machine for the Nurse, and Tomasita Crumble called me back and I discussed this with him and he discussed this with Dr. Hardin Negus and Dr. Irving Burton agreed to take over this medication. I spoke with patient and advised that Dr. Hardin Negus may not have the fax number to send it to [facility], and I gave the patient the number for them to send it to.

## 2019-12-31 NOTE — Telephone Encounter (Signed)
Patient called in wanting to speak about his lyrica ?!?

## 2020-01-12 ENCOUNTER — Other Ambulatory Visit (HOSPITAL_COMMUNITY): Payer: Self-pay | Admitting: *Deleted

## 2020-01-12 ENCOUNTER — Ambulatory Visit: Payer: Medicare HMO | Admitting: Specialist

## 2020-01-12 MED ORDER — EPLERENONE 50 MG PO TABS: 50.0000 mg | ORAL_TABLET | Freq: Every day | ORAL | 3 refills | Status: DC

## 2020-01-14 ENCOUNTER — Telehealth (HOSPITAL_COMMUNITY): Payer: Self-pay | Admitting: Pharmacy Technician

## 2020-01-14 NOTE — Telephone Encounter (Signed)
Received a PA request for Inspra. The patient has been taken up to 50mg  daily and is getting assistance from [employer].  Filled out the medication dose change form from [employer]. Will fax upon prescriber's signature.  Will follow up.

## 2020-01-15 ENCOUNTER — Other Ambulatory Visit: Payer: Self-pay

## 2020-01-15 ENCOUNTER — Telehealth: Payer: Self-pay | Admitting: Family Medicine

## 2020-01-15 ENCOUNTER — Ambulatory Visit: Payer: Self-pay

## 2020-01-15 ENCOUNTER — Encounter: Payer: Self-pay | Admitting: Family Medicine

## 2020-01-15 ENCOUNTER — Ambulatory Visit (INDEPENDENT_AMBULATORY_CARE_PROVIDER_SITE_OTHER): Payer: Medicare HMO | Admitting: Family Medicine

## 2020-01-15 DIAGNOSIS — M79631 Pain in right forearm: Secondary | ICD-10-CM | POA: Diagnosis not present

## 2020-01-15 MED ORDER — DICLOFENAC SODIUM 1 % EX GEL
4.0000 g | Freq: Four times a day (QID) | CUTANEOUS | 6 refills | Status: DC | PRN
Start: 2020-01-15 — End: 2020-07-27

## 2020-01-15 NOTE — Progress Notes (Signed)
Office Visit Note   Patient: [name] [name]           Date of Birth: 09/06/1950           MRN: [id number] Visit Date: 01/15/2020 Requested by: [name] Riches, NP [place],  [place] [address] PCP: [name] Riches, NP  Subjective: Chief Complaint  Patient presents with  . Right Forearm - Pain, Injury    DOI 10/02/21Was jogging around to the back of the house (at night), tripped over a root and fell forward onto a wooden planter. Medial aspect of elbow and forearm are bruised. Pain in whole forearm and into the wrist/hand. Right-hand dominant.    HPI: He is here with right forearm pain.  On October 2 he was jogging around the back of his house, tripped on a tree root and fell landing on a wooden box.  He had immediate pain in his forearm.  He has been treating it on his own but is not getting better.  Pain is on the medial side of his elbow with radiation toward the wrist and hand, mostly on the dorsum of the wrist.  He is right-hand dominant.  He has a history of rotator cuff tear on the right that has not been treated surgically.  He is on chronic narcotics per [name] [name].                ROS:   All other systems were reviewed and are negative.  Objective: Vital Signs: There were no vitals taken for this visit.  Physical Exam:  General:  Alert and oriented, in no acute distress. Pulm:  Breathing unlabored. Psy:  Normal mood, congruent affect. Skin: There is bruising at the proximal medial forearm. Right elbow: He has almost full extension, full flexion, full pronation and supination of the forearm.  No significant elbow effusion.  He is tender distal to the medial epicondyle and the muscle belly.  He has mild pain with forearm pronation against resistance, no significant pain with finger flexion, wrist flexion or finger and wrist extension against resistance.  Imaging: XR Forearm Right  Result Date: 01/15/2020 X-rays of the right forearm reveal moderate DJD in the wrist.   No fat pad sign in the elbow.  Anatomic alignment, no obvious fracture seen.   Assessment & Plan: 1.  Right forearm contusion with strain, cannot rule out occult fracture or muscle tear. -Sling for comfort, range of motion to tolerance.  Voltaren gel topically.  Pain medicines per [name] [name].     Procedures: No procedures performed  No notes on file     PMFS History: Patient Active Problem List   Diagnosis Date Noted  . Arcus senilis 02/12/2019  . Cortical age-related cataract of left eye 02/12/2019  . Excess skin of eyelid 02/12/2019  . Nuclear sclerosis 02/12/2019  . Posterior subcapsular polar senile cataract 02/12/2019  . Pseudophakia 02/12/2019  . Lumbar disc herniation with radiculopathy 09/20/2015    Class: Acute  . Ganglion cyst of wrist 09/20/2015    Class: Chronic  . OSA (obstructive sleep apnea) 07/22/2014  . Hyperlipidemia 01/08/2013  . Elevated LFTs 01/24/2012  . Chronic systolic heart failure ([place]) 11/11/2010  . Asthma 11/11/2010  . ANAL AND RECTAL POLYP 01/14/2010  . HEARTBURN 01/14/2010  . RECTAL BLEEDING 11/24/2009  . BREAST MASS, LEFT 11/24/2009  . COUGH 05/11/2009  . HYPERTRIGLYCERIDEMIA 12/29/2008  . Chest pain 12/29/2008  . Essential hypertension 03/11/2008  . Nonischemic cardiomyopathy ([place]) 03/11/2008  .  Other and unspecified hyperlipidemia 12/25/2006  . ALCOHOLISM 12/25/2006  . ALLERGIC RHINITIS 12/25/2006  . HYPERGLYCEMIA 12/25/2006  . UROLITHIASIS, HX OF 12/25/2006   Past Medical History:  Diagnosis Date  . Allergic rhinitis   . Anxiety   . Asthma    Probable mild intermittent asthma  . CKD (chronic kidney disease)   . Diverticulosis    a. Colonoscopy (1/12) with mild diverticulosis and hemorrhoids  . Elevated LFTs    HCV negative, possibly due to ETOH  . GERD (gastroesophageal reflux disease)    EGD 1/12 with erosive esophagitis and gastritis, biopsy postiive for H pylori (being  treated)  . Gynecomastia    with  spironolactone (ok on eplerenone)  . History of back surgery   . HTN (hypertension)   . Hypertriglyceridemia   . Knee osteoarthritis    s/p arhtroscopy 2010  . Nonischemic cardiomyopathy ([place])    [place] 06/2002 with normal cors; Myoview 11/08 neg for ischemia. Echo 4/08: EF of 35-40%.  Cause of NICM unknown.  Never a heavy drinker, used cocaine or amphetamines. HIV, SPEP, and ferritin/Fe studies were all negative in 12/09. Myoview (5/11): EF 42% with apical thinning but no evidence for ischemia or infarction. Echo (3/12): EF 45-50%, no significant valvular abnormalities.   . Rotator cuff tendonitis    with hx of bilateral shoulder surgery  . Sleep apnea    doesnot use cpap    Family History  Problem Relation Age of Onset  . Stroke Mother   . Prostate cancer Father   . Cardiomyopathy Brother        nonischemic, has icd  . Cirrhosis Brother        alcoholic   . Pancreatitis Sister   . Colon cancer Neg Hx   . Esophageal cancer Neg Hx   . Pancreatic cancer Neg Hx   . Liver disease Neg Hx     Past Surgical History:  Procedure Laterality Date  . ELECTROCARDIOGRAM  09/20/06  . GANGLION CYST EXCISION Right 09/20/2015   Procedure: REMOVAL GANGLION OF WRIST;  Surgeon: Jessy Oto, MD;  Location: [facility];  Service: Orthopedics;  Laterality: Right;  . KNEE ARTHROSCOPY Left    x 2, then partial replacement  . LUMBAR LAMINECTOMY Right 09/20/2015   Procedure: FAR LATERAL APPROACH TO EXCISE HERNIATED NUCLEUS PULPOSUS RIGHT Lumbar 1- Lumbar 2 AND RIGHT Lumbar 2-Lumbar 3, EXCISION OF GANGLION CYST RIGHT VOLAR RADIAL WRIST;  Surgeon: Jessy Oto, MD;  Location: [facility];  Service: Orthopedics;  Laterality: Right;  . MEDIAL PARTIAL KNEE REPLACEMENT Left   . PTCA    . ROTATOR CUFF REPAIR     2R; 3 L  . stress cardiolite  12/15/05  . TRANSTHORACIC ECHOCARDIOGRAM  07/13/06   Social History   Occupational History  . Occupation: Disability  Tobacco Use  . Smoking status: Never Smoker  . Smokeless  tobacco: Never Used  . Tobacco comment: denies   Vaping Use  . Vaping Use: Never used  Substance and Sexual Activity  . Alcohol use: Yes    Alcohol/week: 6.0 standard drinks    Types: 6 Shots of liquor per week    Comment: weekends  . Drug use: No    Comment: Smoked marijuana on occasion in distant past.   . Sexual activity: Not Currently

## 2020-01-15 NOTE — Telephone Encounter (Signed)
Pt called stating Dr.Hilts discussed sending in a cream that supposed to help with pain but [facility] hasn't received it yet; the pt would like to have this sent in please

## 2020-01-15 NOTE — Telephone Encounter (Signed)
I called the patient - Dr. Junius Roads did send in the Diclofenac gel this morning. It is possible that it will need prior authorization, as it is an OTC medication now. If that is the case, it might be a delay in the pharmacy's filling this.

## 2020-01-15 NOTE — Telephone Encounter (Signed)
Medication change form sent in for Inspra.   Will follow up to confirm that the pharmacy received the new RX.

## 2020-01-27 ENCOUNTER — Telehealth (HOSPITAL_COMMUNITY): Payer: Self-pay

## 2020-01-27 NOTE — Telephone Encounter (Signed)
Received patient's Inspra from [facility],pt aware and will pick up medication.

## 2020-02-11 ENCOUNTER — Ambulatory Visit: Payer: Medicare HMO | Admitting: Specialist

## 2020-03-30 ENCOUNTER — Other Ambulatory Visit (HOSPITAL_COMMUNITY): Payer: Self-pay | Admitting: Cardiology

## 2020-05-04 ENCOUNTER — Other Ambulatory Visit (HOSPITAL_COMMUNITY): Payer: Self-pay | Admitting: Cardiology

## 2020-05-27 ENCOUNTER — Other Ambulatory Visit (HOSPITAL_COMMUNITY): Payer: Self-pay | Admitting: Cardiology

## 2020-06-01 ENCOUNTER — Other Ambulatory Visit (HOSPITAL_COMMUNITY): Payer: Self-pay | Admitting: *Deleted

## 2020-06-01 ENCOUNTER — Telehealth (HOSPITAL_COMMUNITY): Payer: Self-pay | Admitting: Licensed Clinical Social Worker

## 2020-06-01 DIAGNOSIS — I5022 Chronic systolic (congestive) heart failure: Secondary | ICD-10-CM

## 2020-06-01 NOTE — Telephone Encounter (Signed)
CSW received a call from pt requesting help getting entresto assistance.  Pt had PAN foundation but it expired in January.  Pt has about a month and a half left of medication but it would cost him about $50 a month which would be tight for him.  CSW added pt on [employer] foundation waitlist and mailed pt a [employer] application to complete.  Will continue to follow and assist as needed  Jorge Ny, [facility] Desk#: [phone] Cell#: [phone]

## 2020-06-11 ENCOUNTER — Ambulatory Visit: Payer: Medicare HMO | Admitting: Gastroenterology

## 2020-06-11 ENCOUNTER — Telehealth (HOSPITAL_COMMUNITY): Payer: Self-pay | Admitting: Pharmacy Technician

## 2020-06-11 NOTE — Telephone Encounter (Signed)
Sent in [employer] application via fax.  Will follow up.  

## 2020-06-21 NOTE — Telephone Encounter (Signed)
Advanced Heart Failure Patient Advocate Encounter   Patient was approved to receive Entresto from [employer]  Patient ID: [id number] Effective dates: 06/15/20 through 04/09/21  [employer] called and left a message for the patient. He would need to call in and request a refill. Called and spoke with the patient and provided him the phone number to [employer].   Charlann Boxer, CPhT

## 2020-07-05 ENCOUNTER — Telehealth (HOSPITAL_COMMUNITY): Payer: Self-pay | Admitting: Licensed Clinical Social Worker

## 2020-07-05 NOTE — Telephone Encounter (Signed)
CSW received call from pt requesting help calling in refill for pt Inspra be shipped to physicians office.  CSW called Patient Assistance program who informed CSW pt enrollment ended 04/09/20 and that he would need to reapply.  CSW informed pt who states he can get medication for $18 at [facility] and would rather do that from now on- requests we send script to his local pharmacy so he can pick up- CSW sent message to clinic staff to assist  Jorge Ny, [facility] Desk#: [phone] Cell#: [phone]

## 2020-07-06 ENCOUNTER — Telehealth (HOSPITAL_COMMUNITY): Payer: Self-pay | Admitting: Cardiology

## 2020-07-06 MED ORDER — EPLERENONE 50 MG PO TABS: 50.0000 mg | ORAL_TABLET | Freq: Every day | ORAL | 3 refills | Status: DC

## 2020-07-06 NOTE — Telephone Encounter (Signed)
Refills returned

## 2020-07-06 NOTE — Telephone Encounter (Signed)
-----   Message from Jorge Ny, [place] sent at 07/05/2020  4:22 PM EDT ----- Can you please send this pts Inspra script to the [facility] at [place]

## 2020-07-27 ENCOUNTER — Ambulatory Visit (HOSPITAL_COMMUNITY)
Admission: RE | Admit: 2020-07-27 | Discharge: 2020-07-27 | Disposition: A | Discharge status: Home / Self Care | Payer: Medicare HMO | Source: Ambulatory Visit | Attending: Cardiology | Admitting: Cardiology

## 2020-07-27 ENCOUNTER — Other Ambulatory Visit: Payer: Self-pay

## 2020-07-27 ENCOUNTER — Ambulatory Visit (HOSPITAL_BASED_OUTPATIENT_CLINIC_OR_DEPARTMENT_OTHER)
Admission: RE | Admit: 2020-07-27 | Discharge: 2020-07-27 | Disposition: A | Discharge status: Home / Self Care | Payer: Medicare HMO | Source: Ambulatory Visit | Attending: Cardiology | Admitting: Cardiology

## 2020-07-27 ENCOUNTER — Encounter (HOSPITAL_COMMUNITY): Payer: Self-pay | Admitting: Cardiology

## 2020-07-27 VITALS — BP 94/50 | HR 66 | Wt 191.2 lb

## 2020-07-27 DIAGNOSIS — E781 Pure hyperglyceridemia: Secondary | ICD-10-CM

## 2020-07-27 DIAGNOSIS — Z7901 Long term (current) use of anticoagulants: Secondary | ICD-10-CM | POA: Diagnosis not present

## 2020-07-27 DIAGNOSIS — N189 Chronic kidney disease, unspecified: Secondary | ICD-10-CM | POA: Diagnosis not present

## 2020-07-27 DIAGNOSIS — I428 Other cardiomyopathies: Secondary | ICD-10-CM | POA: Diagnosis not present

## 2020-07-27 DIAGNOSIS — I5022 Chronic systolic (congestive) heart failure: Secondary | ICD-10-CM

## 2020-07-27 DIAGNOSIS — I130 Hypertensive heart and chronic kidney disease with heart failure and stage 1 through stage 4 chronic kidney disease, or unspecified chronic kidney disease: Secondary | ICD-10-CM | POA: Diagnosis not present

## 2020-07-27 DIAGNOSIS — R059 Cough, unspecified: Secondary | ICD-10-CM | POA: Diagnosis not present

## 2020-07-27 DIAGNOSIS — Z79899 Other long term (current) drug therapy: Secondary | ICD-10-CM | POA: Insufficient documentation

## 2020-07-27 DIAGNOSIS — E785 Hyperlipidemia, unspecified: Secondary | ICD-10-CM | POA: Insufficient documentation

## 2020-07-27 DIAGNOSIS — Z8249 Family history of ischemic heart disease and other diseases of the circulatory system: Secondary | ICD-10-CM | POA: Diagnosis not present

## 2020-07-27 DIAGNOSIS — G4733 Obstructive sleep apnea (adult) (pediatric): Secondary | ICD-10-CM | POA: Insufficient documentation

## 2020-07-27 HISTORY — DX: Heart failure, unspecified: I50.9

## 2020-07-27 LAB — BASIC METABOLIC PANEL
Anion gap: 9 (ref 5–15)
BUN: 21 mg/dL (ref 8–23)
CO2: 25 mmol/L (ref 22–32)
Calcium: 9 mg/dL (ref 8.9–10.3)
Chloride: 102 mmol/L (ref 98–111)
Creatinine, Ser: 1.3 mg/dL — ABNORMAL HIGH (ref 0.61–1.24)
GFR, Estimated: >60 mL/min (ref >60)
Glucose, Bld: 108 mg/dL — ABNORMAL HIGH (ref 70–99)
Potassium: 4.3 mmol/L (ref 3.5–5.1)
Sodium: 136 mmol/L (ref 135–145)

## 2020-07-27 LAB — ECHOCARDIOGRAM COMPLETE
Area-P 1/2: 3.21 cm2
S' Lateral: 4 cm

## 2020-07-27 LAB — LIPID PANEL
Cholesterol: 135 mg/dL (ref 0–200)
HDL: 31 mg/dL — ABNORMAL LOW (ref >40)
LDL Cholesterol: 46 mg/dL (ref 0–99)
Total CHOL/HDL Ratio: 4.4 RATIO
Triglycerides: 292 mg/dL — ABNORMAL HIGH (ref <150)
VLDL: 58 mg/dL — ABNORMAL HIGH (ref 0–40)

## 2020-07-27 NOTE — Progress Notes (Signed)
Patient ID: [name] [name], male   DOB: [birth year], 70 y.o.   MRN: [id number] Pulmonary: Dr Elsworth Soho PCP: Imagene Riches, NP Cardiology: Dr. Aundra Dubin  70 y.o. with history of nonischemic cardiomyopathy presents for followup of CHF.  Echo in 12/20 showed EF 40-45%.  This is mildly lower than prior.  He had been drinking more.   Echo today showed EF up to 50-55%, normal RV.   He has cut back a lot on ETOH and now drinks very little.  Goes to gym regularly.  No exertional dyspnea or chest pain.  Has some wheezing this time of year, attributes to seasonal allergies. He has a nocturnal cough and finds it hard to clear his throat when he lies down.  He is taking Protonix but this does not seem to be helping.  Weight is up, but he attributes this to weight lifting and gaining muscle. No lightheadedness.   Labs (12/09): SPEP negative, HIV negative, transferrin saturation 29%, BNP 24 Labs (9/10): direct LDL 54, HDL 13.8, triglycerides 1470, creatinine 0.8 Labs (05/11/09): BNP 27, WBCs 5.6 Labs (5/11): K 4.6, creatinine 1.2, TGs 238, LDL 108, HDL 34 Labs (8/11): K 4.3, creatinine 1.4, BNP 12.6 Labs (2/12): K 4.1, creatinine 1.7, LDL 102 Labs (8/12): K 4.7, creatinine 1.5 Labs (4/13): LDL 132, HDL 33.4, AST 41, ALT 63 Labs (7/13): K 4, creatinine 1.2, ALT 59, AST 36, HCV negative Labs (1/14): K 4.1, creatinine 1.5 Labs (04/24/14): Cholesterol 222, TGL 265, HDL 37, LDL 169 Labs (4/16): LDL 110, HDl 38 Labs (9/16): LDL 116, HDL 36, TGs 175 Labs (01/2015): K 3.8 Creatinine 1.2 => 1.08, HCT 37 Labs (3/17): creatinine 1.07, BNP 8.9 Labs (6/17): K 4.2, creatinine 1.0 Labs (9/18): TGs 215, LDL 106, HDL 40 Labs (12/18): K 4.4, creatinine 1.09 Labs (12/19): LDL 149 Labs (12/20): LDL 100 Labs (1/21): K 4.4, creatinine 1.15 Labs (6/21): K 4.3, creatinine 1.16  ECG (personally reviewed): NSR, RBBB  Allergies (verified):  No Known Drug Allergies  Past Medical History: 1. Nonischemic cardiomyopathy.  The  patient had a left heart catheterization done in March 2004 with normal coronaries and then in November 2008, he had a Myoview done that showed an EF of 39% with no ischemia.  Echo in April 2008 showed an EF of 35-40%, moderate diffuse hypokinesis, mild left atrial enlargement, normal RV size and function and essentially normal valves.  The cause of his cardiomyopathy has not been discovered. He drinks but not extremely heavily.  He has never used cocaine and amphetamines.  HIV, SPEP, and ferritin/Fe studies were all negative in 12/09.  Echo (9/10): EF 40%, mild to moderate global HK, mild LAE.  Myoview (5/11): EF 42% with apical thinning but no evidence for ischemia or infarction.  Echo (3/12): EF 45-50%, no significant valvular abnormalities. Echo (1/16) with EF 35-40%, diffuse hypokinesis.  - Cardiac MRI (3/17) with EF 43%, mild diffuse hypokinesis, mid-wall LGE in the mid septum, possible prior viral myocarditis.  - Echo (10/18): EF 50-55%, normal RV size and systolic function.  - Echo (12/20): EF 40-45%, normal RV.  - Echo (4/22): EF 50-55%, normal RV.  2. Hypertriglyceridemia 3. Rotator cuff tendonitis with a history of bilateral shoulder surgery. 4. Hypertension. 5. History of a back surgery. 6. Knee osteoarthritis, s/p arthroscopy 2010 7. Gastroesophageal reflux disease. EGD (1/12) with erosive esophagitis and gastritis, biopsy positive for H pylori (being treated). 8. Colonoscopy (1/12) with mild diverticulosis and hemorrhoids.  9. Allergic rhinitis 10. Gynecomastia with spironolactone (ok  on eplerenone).  11. Probable mild intermittent asthma 12. Elevated LFTs: HCV negative, possibly due to ETOH 13. CKD 14. OSA: CPAP.  15. H/o herniated nucleus pulposus: s/p surgery 6/17.  Family History: No premature CAD.  Father with prostate cancer Mother with CVA Brother with nonischemic cardiomyopathy, has ICD Grandmother with cancer (unsure what)    Social History: The patient denies  smoking.  Lives in [place].  He does not use any drugs. He has never used any illicit drugs other than marijuana, which he smoked occasionally in the distant past.  Moderate ETOH.He is on disability. He is divorced with two sons.     ROS:  All systems reviewed and negative except as per HPI.    Current Outpatient Medications  Medication Sig Dispense Refill  . albuterol (VENTOLIN HFA) 108 (90 Base) MCG/ACT inhaler Inhale 1-2 puffs into the lungs every 6 (six) hours as needed. 18 g 11  . carvedilol (COREG) 12.5 MG tablet Take 1 tablet by mouth twice daily 180 tablet 3  . cetirizine (ZYRTEC) 10 MG tablet Take 10 mg by mouth daily as needed for allergies or rhinitis.    Marland Kitchen eplerenone (INSPRA) 50 MG tablet Take 1 tablet (50 mg total) by mouth daily. 30 tablet 3  . escitalopram (LEXAPRO) 20 MG tablet Take 20 mg by mouth at bedtime.    . fluticasone (FLONASE) 50 MCG/ACT nasal spray Place 2 sprays into both nostrils daily as needed for allergies or rhinitis.    Marland Kitchen HYDROmorphone (DILAUDID) 2 MG tablet Take 2 mg by mouth every 6 (six) hours as needed.    . pantoprazole (PROTONIX) 40 MG tablet Take 1 tablet (40 mg total) by mouth daily. 90 tablet 3  . pregabalin (LYRICA) 100 MG capsule Take 1 capsule (100 mg total) by mouth 2 (two) times daily. 180 capsule 3  . rosuvastatin (CRESTOR) 40 MG tablet Take 1 tablet (40 mg total) by mouth daily. Need appt for future refills 90 tablet 0  . sacubitril-valsartan (ENTRESTO) 49-51 MG Take 1 tablet by mouth 2 (two) times daily. Needs doctor appointment for further refill. 60 tablet 0  . sildenafil (VIAGRA) 100 MG tablet Take 100 mg by mouth as needed for erectile dysfunction.      No current facility-administered medications for this encounter.    BP (!) 94/50   Pulse 66   Wt 86.7 kg (191 lb 3.2 oz)   SpO2 95%   BMI 29.95 kg/m  General: NAD Neck: No JVD, no thyromegaly or thyroid nodule.  Lungs: Clear to auscultation bilaterally with normal respiratory  effort. CV: Nondisplaced PMI.  Heart regular S1/S2, no S3/S4, no murmur.  No peripheral edema.  No carotid bruit.  Normal pedal pulses.  Abdomen: Soft, nontender, no hepatosplenomegaly, no distention.  Skin: Intact without lesions or rashes.  Neurologic: Alert and oriented x 3.  Psych: Normal affect. Extremities: No clubbing or cyanosis.  HEENT: Normal.   Assessment/Plan:  1. Chronic systolic heart failure: Nonischemic cardiomyopathy, ECHO 04/2014 with EF down to 35-40%.  Cardiac MRI in 3/17, however, showed EF 43% with mid-wall pattern of LGE in the septum that may be suggestive of prior myocarditis.  Echo in 10/18 showed EF up to 50-55. HIV negative, SPEP negative, no evidence for hemochromatosis. Would also consider familial cardiomyopathy as his brother had cardiomyopathy with SCD at a young age, says his mother's family has multiple members with CHF.  Echo in 12/20 showed EF down to 40-45%.  Echo today showed EF back up to  50-55% (he has cut back on ETOH).  NYHA I-II. Volume status stable.  - Continue to keep ETOH at a minimum.  - Continue eplerenone 50 mg daily, BMET today.  - Continue carvedilol 12.5 mg twice a day.  - Continue Entresto 49/51 bid.  - He does not need Lasix.  - I will arrange for [facility] gene testing for cardiomyopathy given family history.  He has 2 children who could be tested if he is found to have a concerning mutation.  2. Hyperlipidemia: He is on Crestor, check lipids today.  3. OSA: Unable to tolerate CPAP.   Followup in 9 months given improvement in EF.   Loralie Champagne 07/27/2020

## 2020-07-27 NOTE — Progress Notes (Signed)
  Echocardiogram 2D Echocardiogram has been performed.  [name] [name] 07/27/2020, 10:32 AM

## 2020-07-27 NOTE — Progress Notes (Signed)
Blood collected for [facility]TR genetic testing per Dr McLean.  Order form completed and both shipped by [employer] to [facility].  

## 2020-07-27 NOTE — Progress Notes (Incomplete)
Patient ID: [name] [name], male   DOB: April 07, 1951, 70 y.o.   MRN: [id number] Pulmonary: Dr Elsworth Soho PCP: Imagene Riches, NP Cardiology: Dr. Aundra Dubin  70 y.o. with history of nonischemic cardiomyopathy presents for followup of CHF.  Echo in 12/20 showed EF 40-45%.  This is mildly lower than prior.  He had been drinking more.   Echo today showed EF up to 50-55%, normal RV.   He has cut back a lot on ETOH and now drinks very little.  Goes to gym regularly.  No exertional dyspnea or chest pain.  Has some wheezing this time of year, attributes to seasonal allergies. He has a nocturnal cough and finds it hard to clear his throat when he lies down.  He is taking Protonix but this does not seem to be helping.  Weight is up, but he attributes this to weight lifting and gaining muscle. No lightheadedness.   Labs (12/09): SPEP negative, HIV negative, transferrin saturation 29%, BNP 24 Labs (9/10): direct LDL 54, HDL 13.8, triglycerides 1470, creatinine 0.8 Labs (05/11/09): BNP 27, WBCs 5.6 Labs (5/11): K 4.6, creatinine 1.2, TGs 238, LDL 108, HDL 34 Labs (8/11): K 4.3, creatinine 1.4, BNP 12.6 Labs (2/12): K 4.1, creatinine 1.7, LDL 102 Labs (8/12): K 4.7, creatinine 1.5 Labs (4/13): LDL 132, HDL 33.4, AST 41, ALT 63 Labs (7/13): K 4, creatinine 1.2, ALT 59, AST 36, HCV negative Labs (1/14): K 4.1, creatinine 1.5 Labs (04/24/14): Cholesterol 222, TGL 265, HDL 37, LDL 169 Labs (4/16): LDL 110, HDl 38 Labs (9/16): LDL 116, HDL 36, TGs 175 Labs (01/2015): K 3.8 Creatinine 1.2 => 1.08, HCT 37 Labs (3/17): creatinine 1.07, BNP 8.9 Labs (6/17): K 4.2, creatinine 1.0 Labs (9/18): TGs 215, LDL 106, HDL 40 Labs (12/18): K 4.4, creatinine 1.09 Labs (12/19): LDL 149 Labs (12/20): LDL 100 Labs (1/21): K 4.4, creatinine 1.15 Labs (6/21): K 4.3, creatinine 1.16  ECG (personally reviewed): NSR, RBBB  Allergies (verified):  No Known Drug Allergies  Past Medical History: 1. Nonischemic cardiomyopathy.  The  patient had a left heart catheterization done in March 2004 with normal coronaries and then in November 2008, he had a Myoview done that showed an EF of 39% with no ischemia.  Echo in April 2008 showed an EF of 35-40%, moderate diffuse hypokinesis, mild left atrial enlargement, normal RV size and function and essentially normal valves.  The cause of his cardiomyopathy has not been discovered. He drinks but not extremely heavily.  He has never used cocaine and amphetamines.  HIV, SPEP, and ferritin/Fe studies were all negative in 12/09.  Echo (9/10): EF 40%, mild to moderate global HK, mild LAE.  Myoview (5/11): EF 42% with apical thinning but no evidence for ischemia or infarction.  Echo (3/12): EF 45-50%, no significant valvular abnormalities. Echo (1/16) with EF 35-40%, diffuse hypokinesis.  - Cardiac MRI (3/17) with EF 43%, mild diffuse hypokinesis, mid-wall LGE in the mid septum, possible prior viral myocarditis.  - Echo (10/18): EF 50-55%, normal RV size and systolic function.  - Echo (12/20): EF 40-45%, normal RV.  - Echo (4/22): EF 50-55%, normal RV.  2. Hypertriglyceridemia 3. Rotator cuff tendonitis with a history of bilateral shoulder surgery. 4. Hypertension. 5. History of a back surgery. 6. Knee osteoarthritis, s/p arthroscopy 2010 7. Gastroesophageal reflux disease. EGD (1/12) with erosive esophagitis and gastritis, biopsy positive for H pylori (being treated). 8. Colonoscopy (1/12) with mild diverticulosis and hemorrhoids.  9. Allergic rhinitis 10. Gynecomastia with spironolactone (ok  on eplerenone).  11. Probable mild intermittent asthma 12. Elevated LFTs: HCV negative, possibly due to ETOH 13. CKD 14. OSA: CPAP.  15. H/o herniated nucleus pulposus: s/p surgery 6/17.  Family History: No premature CAD.  Father with prostate cancer Mother with CVA Brother with nonischemic cardiomyopathy, has ICD Grandmother with cancer (unsure what)    Social History: The patient denies  smoking.  Lives in [place].  He does not use any drugs. He has never used any illicit drugs other than marijuana, which he smoked occasionally in the distant past.  Moderate ETOH.He is on disability. He is divorced with two sons.     ROS:  All systems reviewed and negative except as per HPI.    Current Outpatient Medications  Medication Sig Dispense Refill  . albuterol (VENTOLIN HFA) 108 (90 Base) MCG/ACT inhaler Inhale 1-2 puffs into the lungs every 6 (six) hours as needed. 18 g 11  . carvedilol (COREG) 12.5 MG tablet Take 1 tablet by mouth twice daily 180 tablet 3  . cetirizine (ZYRTEC) 10 MG tablet Take 10 mg by mouth daily as needed for allergies or rhinitis.    Marland Kitchen eplerenone (INSPRA) 50 MG tablet Take 1 tablet (50 mg total) by mouth daily. 30 tablet 3  . escitalopram (LEXAPRO) 20 MG tablet Take 20 mg by mouth at bedtime.    . fluticasone (FLONASE) 50 MCG/ACT nasal spray Place 2 sprays into both nostrils daily as needed for allergies or rhinitis.    Marland Kitchen HYDROmorphone (DILAUDID) 2 MG tablet Take 2 mg by mouth every 6 (six) hours as needed.    . pantoprazole (PROTONIX) 40 MG tablet Take 1 tablet (40 mg total) by mouth daily. 90 tablet 3  . pregabalin (LYRICA) 100 MG capsule Take 1 capsule (100 mg total) by mouth 2 (two) times daily. 180 capsule 3  . rosuvastatin (CRESTOR) 40 MG tablet Take 1 tablet (40 mg total) by mouth daily. Need appt for future refills 90 tablet 0  . sacubitril-valsartan (ENTRESTO) 49-51 MG Take 1 tablet by mouth 2 (two) times daily. Needs doctor appointment for further refill. 60 tablet 0  . sildenafil (VIAGRA) 100 MG tablet Take 100 mg by mouth as needed for erectile dysfunction.      No current facility-administered medications for this encounter.    BP (!) 94/50   Pulse 66   Wt 86.7 kg (191 lb 3.2 oz)   SpO2 95%   BMI 29.95 kg/m  General: NAD Neck: No JVD, no thyromegaly or thyroid nodule.  Lungs: Clear to auscultation bilaterally with normal respiratory  effort. CV: Nondisplaced PMI.  Heart regular S1/S2, no S3/S4, no murmur.  No peripheral edema.  No carotid bruit.  Normal pedal pulses.  Abdomen: Soft, nontender, no hepatosplenomegaly, no distention.  Skin: Intact without lesions or rashes.  Neurologic: Alert and oriented x 3.  Psych: Normal affect. Extremities: No clubbing or cyanosis.  HEENT: Normal.   Assessment/Plan:  1. Chronic systolic heart failure: Nonischemic cardiomyopathy, ECHO 04/2014 with EF down to 35-40%.  Cardiac MRI in 3/17, however, showed EF 43% with mid-wall pattern of LGE in the septum that may be suggestive of prior myocarditis.  Echo in 10/18 showed EF up to 50-55. HIV negative, SPEP negative, no evidence for hemochromatosis. Would also consider familial cardiomyopathy as his brother apparently also has a nonischemic cardiomyopathy and got an ICD.  Most recent echo in 12/20 showed EF down to 40-45%.  He had been drinking more, this could be the cause of EF  fall. NYHA I-II. Volume status stable. Not currently on lasix.  He has now cut back on ETOH.  - Continue to keep ETOH at a minimum.  - Increase eplerenone back to 50 mg daily, BMET today and in 10 days.  - Continue carvedilol 12.5 mg twice a day.  - Continue Entresto 49/51 bid, I do not think that he has BP room right now for 97/103 bid.    2. Hyperlipidemia: He is on Crestor, good lipids in 3/21.  3. OSA: Unable to tolerate CPAP.   Followup in 4 months.   Loralie Champagne 07/27/2020

## 2020-07-27 NOTE — Patient Instructions (Addendum)
EKG done today.  Labs done today. We will contact you only if your labs are abnormal.  Genetic test has been done, this has to be sent to [place] to be processed and can take 1-2 weeks to get results back.  We will let you know the results.  No medication changes were made. Please continue all current medications as prescribed.  Your physician recommends that you schedule a follow-up appointment in:  3 months for a lab only appointment and in 9 months for an appointment for an appointment with Dr. Aundra Dubin.   If you have any questions or concerns before your next appointment please send [place] a message through [place] or call our office at [phone].    TO LEAVE A MESSAGE FOR THE NURSE SELECT OPTION 2, PLEASE LEAVE A MESSAGE INCLUDING: . YOUR NAME . DATE OF BIRTH . CALL BACK NUMBER . REASON FOR CALL**this is important as we prioritize the call backs  YOU WILL RECEIVE A CALL BACK THE SAME DAY AS LONG AS YOU CALL BEFORE 4:00 PM   Do the following things EVERYDAY: 1) Weigh yourself in the morning before breakfast. Write it down and keep it in a log. 2) Take your medicines as prescribed 3) Eat low salt foods--Limit salt (sodium) to 2000 mg per day.  4) Stay as active as you can everyday 5) Limit all fluids for the day to less than 2 liters   At the [facility], you and your health needs are our priority. As part of our continuing mission to provide you with exceptional heart care, we have created designated Provider Care Teams. These Care Teams include your primary Cardiologist (physician) and Advanced Practice Providers (APPs- Physician Assistants and Nurse Practitioners) who all work together to provide you with the care you need, when you need it.   You may see any of the following providers on your designated Care Team at your next follow up: Marland Kitchen Dr Glori Bickers . Dr Loralie Champagne . Darrick Grinder, NP . Lyda Jester, PA . Audry Riles, PharmD   Please be  sure to bring in all your medications bottles to every appointment.

## 2020-08-04 ENCOUNTER — Telehealth: Payer: Self-pay | Admitting: Gastroenterology

## 2020-08-04 ENCOUNTER — Encounter: Payer: Self-pay | Admitting: Gastroenterology

## 2020-08-04 ENCOUNTER — Other Ambulatory Visit (HOSPITAL_COMMUNITY): Payer: Self-pay

## 2020-08-04 ENCOUNTER — Ambulatory Visit: Payer: Medicare HMO | Admitting: Gastroenterology

## 2020-08-04 DIAGNOSIS — R0989 Other specified symptoms and signs involving the circulatory and respiratory systems: Secondary | ICD-10-CM

## 2020-08-04 DIAGNOSIS — Z1211 Encounter for screening for malignant neoplasm of colon: Secondary | ICD-10-CM | POA: Diagnosis not present

## 2020-08-04 DIAGNOSIS — R6889 Other general symptoms and signs: Secondary | ICD-10-CM

## 2020-08-04 MED ORDER — FAMOTIDINE 20 MG PO TABS: 20.0000 mg | ORAL_TABLET | Freq: Every day | ORAL | Status: DC

## 2020-08-04 MED ORDER — PLENVU 140 G PO SOLR: 1.0000 | ORAL | 0 refills | Status: DC

## 2020-08-04 MED ORDER — ROSUVASTATIN CALCIUM 40 MG PO TABS: 40.0000 mg | ORAL_TABLET | Freq: Every day | ORAL | 0 refills | Status: DC

## 2020-08-04 NOTE — Patient Instructions (Signed)
If you are age 68 or older, your body mass index should be between 23-30. Your Body mass index is 30.07 kg/m. If this is out of the aforementioned range listed, please consider follow up with your Primary Care Provider.  You have been scheduled for an endoscopy and colonoscopy. Please follow the written instructions given to you at your visit today. Please pick up your prep supplies at the pharmacy within the next 1-3 days. If you use inhalers (even only as needed), please bring them with you on the day of your procedure.  Due to recent changes in healthcare laws, you may see the results of your imaging and laboratory studies on MyChart before your provider has had a chance to review them.  We understand that in some cases there may be results that are confusing or concerning to you. Not all laboratory results come back in the same time frame and the provider may be waiting for multiple results in order to interpret others.  Please give [place] 48 hours in order for your provider to thoroughly review all the results before contacting the office for clarification of your results.   CONTINUE: protonix shortly before breakfast every morning.  Please purchase the following medications over the counter and take as directed:  START: Pepcid (famotidine) 20mg  take one tablet at bedtime each night.  Thank you for entrusting me with your care and choosing [facility].  Dr Ardis Hughs

## 2020-08-04 NOTE — Progress Notes (Signed)
Review of pertinent gastrointestinal problems: 1. Routine risk for colon cancer: colonoscopy 04/2010 Dr. Ardis Hughs done for minor rectal bleeding; small hemorrhoids, left sided diverticulosis, no polyps. Recall colon cancer screening recommended at 10 years 2. H pylori + gastritis; EGD Ardis Hughs 04/2010 done for dyspepsia; treated with abx 3. Erosive esophagitis, EGD 2012. 4. Small to medium sized HH.   HPI: This is a very pleasant 70 year old man  I last saw him via telemedicine visit about 2 years ago.  We discussed his chronic GERD without alarm symptoms.  He was doing very well as long as he takes his proton pump inhibitor on a daily basis.  His heartburn and indigestion remain under very good control on proton pump inhibitor pantoprazole once daily which she takes shortly before his breakfast meal.  He has been bothered by throat clearing and coughing at nighttime for about 6 months.  He does not eat generally within 2 or 3 hours of laying down.  He does not drink much caffeine and only has rare alcohol.  He does not have dysphagia to solids or liquids.  His weight is up 12 pounds in the past 6 months.  His bowel habits been fine, no overt bleeding, no serious constipation or diarrhea.  ROS: complete GI ROS as described in HPI, all other review negative.  Constitutional:  No unintentional weight loss   Past Medical History:  Diagnosis Date  . Allergic rhinitis   . Anxiety   . Asthma    Probable mild intermittent asthma  . CHF (congestive heart failure) ([place])   . CKD (chronic kidney disease)   . Diverticulosis    a. Colonoscopy (1/12) with mild diverticulosis and hemorrhoids  . Elevated LFTs    HCV negative, possibly due to ETOH  . GERD (gastroesophageal reflux disease)    EGD 1/12 with erosive esophagitis and gastritis, biopsy postiive for H pylori (being  treated)  . Gynecomastia    with spironolactone (ok on eplerenone)  . History of back surgery   . HTN (hypertension)   .  Hypertriglyceridemia   . Knee osteoarthritis    s/p arhtroscopy 2010  . Nonischemic cardiomyopathy ([place])    [place] 06/2002 with normal cors; Myoview 11/08 neg for ischemia. Echo 4/08: EF of 35-40%.  Cause of NICM unknown.  Never a heavy drinker, used cocaine or amphetamines. HIV, SPEP, and ferritin/Fe studies were all negative in 12/09. Myoview (5/11): EF 42% with apical thinning but no evidence for ischemia or infarction. Echo (3/12): EF 45-50%, no significant valvular abnormalities.   . Rotator cuff tendonitis    with hx of bilateral shoulder surgery  . Sleep apnea    doesnot use cpap    Past Surgical History:  Procedure Laterality Date  . ELECTROCARDIOGRAM  09/20/06  . GANGLION CYST EXCISION Right 09/20/2015   Procedure: REMOVAL GANGLION OF WRIST;  Surgeon: Jessy Oto, MD;  Location: [facility];  Service: Orthopedics;  Laterality: Right;  . KNEE ARTHROSCOPY Left    x 2, then partial replacement  . LUMBAR LAMINECTOMY Right 09/20/2015   Procedure: FAR LATERAL APPROACH TO EXCISE HERNIATED NUCLEUS PULPOSUS RIGHT Lumbar 1- Lumbar 2 AND RIGHT Lumbar 2-Lumbar 3, EXCISION OF GANGLION CYST RIGHT VOLAR RADIAL WRIST;  Surgeon: Jessy Oto, MD;  Location: [facility];  Service: Orthopedics;  Laterality: Right;  . MEDIAL PARTIAL KNEE REPLACEMENT Left   . PTCA    . ROTATOR CUFF REPAIR     2R; 3 L  . stress cardiolite  12/15/05  .  TRANSTHORACIC ECHOCARDIOGRAM  07/13/06    Current Outpatient Medications  Medication Sig Dispense Refill  . carvedilol (COREG) 12.5 MG tablet Take 1 tablet by mouth twice daily 180 tablet 3  . cetirizine (ZYRTEC) 10 MG tablet Take 10 mg by mouth daily as needed for allergies or rhinitis.    Marland Kitchen eplerenone (INSPRA) 50 MG tablet Take 1 tablet (50 mg total) by mouth daily. 30 tablet 3  . escitalopram (LEXAPRO) 20 MG tablet Take 20 mg by mouth at bedtime.    Marland Kitchen HYDROmorphone (DILAUDID) 2 MG tablet Take 2 mg by mouth every 6 (six) hours as needed.    . pantoprazole (PROTONIX) 40 MG tablet  Take 1 tablet (40 mg total) by mouth daily. 90 tablet 3  . pregabalin (LYRICA) 100 MG capsule Take 1 capsule (100 mg total) by mouth 2 (two) times daily. 180 capsule 3  . rosuvastatin (CRESTOR) 40 MG tablet Take 1 tablet (40 mg total) by mouth daily. Need appt for future refills 90 tablet 0  . sacubitril-valsartan (ENTRESTO) 49-51 MG Take 1 tablet by mouth 2 (two) times daily. Needs doctor appointment for further refill. 60 tablet 0  . sildenafil (VIAGRA) 100 MG tablet Take 100 mg by mouth as needed for erectile dysfunction.      No current facility-administered medications for this visit.    Allergies as of 08/04/2020  . (No Known Allergies)    Family History  Problem Relation Age of Onset  . Stroke Mother   . Prostate cancer Father   . Cardiomyopathy Brother        nonischemic, has icd  . Cirrhosis Brother        alcoholic   . Pancreatitis Sister   . Colon cancer Neg Hx   . Esophageal cancer Neg Hx   . Pancreatic cancer Neg Hx   . Liver disease Neg Hx     Social History   Socioeconomic History  . Marital status: Divorced    Spouse name: Not on file  . Number of children: 2  . Years of education: Not on file  . Highest education level: Not on file  Occupational History  . Occupation: Disability  Tobacco Use  . Smoking status: Never Smoker  . Smokeless tobacco: Never Used  . Tobacco comment: denies   Vaping Use  . Vaping Use: Never used  Substance and Sexual Activity  . Alcohol use: Yes    Alcohol/week: 6.0 standard drinks    Types: 6 Shots of liquor per week    Comment: weekends  . Drug use: No    Comment: Smoked marijuana on occasion in distant past.   . Sexual activity: Not Currently  Other Topics Concern  . Not on file  Social History Narrative  . Not on file   Social Determinants of Health   Financial Resource Strain: Not on file  Food Insecurity: Not on file  Transportation Needs: Not on file  Physical Activity: Not on file  Stress: Not on file   Social Connections: Not on file  Intimate Partner Violence: Not on file     Physical Exam: BP 112/64   Pulse 66   Wt 192 lb (87.1 kg)   BMI 30.07 kg/m  Constitutional: generally well-appearing Psychiatric: alert and oriented x3 Abdomen: soft, nontender, nondistended, no obvious ascites, no peritoneal signs, normal bowel sounds No peripheral edema noted in lower extremities  Assessment and plan: 70 y.o. male with routine risk for colon cancer, chronic GERD, new nighttime throat clearing and coughing  First he understands that he is "due" for colon cancer screening since his last colonoscopy was about 10 years ago.  We will arrange for colonoscopy at his soonest convenience.  Second he understands acid might be playing a role in some of his throat clearing, nighttime coughing symptoms.  I recommended that he start H2 blocker Pepcid 20 mg 1 pill at bedtime every night in addition to his usual pantoprazole 40 mg shortly before breakfast.  At the same time as his colonoscopy you I will proceed with EGD to evaluate for significant GERD damage, perhaps his hiatal hernia has increased in size.  It will also be interesting to hear how he does at that point since starting his nighttime H2 blocker.  Please see the "Patient Instructions" section for addition details about the plan.  Owens Loffler, MD [facility] 08/04/2020, 9:58 AM   Total time on date of encounter was 35 minutes (this included time spent preparing to see the patient reviewing records; obtaining and/or reviewing separately obtained history; performing a medically appropriate exam and/or evaluation; counseling and educating the patient and family if present; ordering medications, tests or procedures if applicable; and documenting clinical information in the health record).

## 2020-08-04 NOTE — Telephone Encounter (Signed)
Inbound call from patient. States he went to pick up Plenvu but the cost is $147. Asked if there is anything cheaper he could take. Best contact number is [phone]

## 2020-08-06 NOTE — Telephone Encounter (Signed)
Patient advised that prep can be changed to Miralax.  Patient advised that new prep instructions will be mailed to his home.  Patient advised to contact our office with any questions or concerns that he might have after he receives instructions.  Patient agreed to plan and verbalized understanding.  No further questions.

## 2020-08-18 ENCOUNTER — Other Ambulatory Visit: Payer: Self-pay | Admitting: Gastroenterology

## 2020-09-15 ENCOUNTER — Other Ambulatory Visit (HOSPITAL_COMMUNITY): Payer: Self-pay | Admitting: Cardiology

## 2020-10-18 ENCOUNTER — Telehealth: Payer: Self-pay | Admitting: Gastroenterology

## 2020-10-18 NOTE — Telephone Encounter (Signed)
Called patient back after he reported that he had a COVID exposure on Saturday. He is experiencing some coughing and a low grade fever. Rescheduled the patient for 12/29/20 and discussed changing the times of his prep. PT verbalized understanding.

## 2020-10-18 NOTE — Telephone Encounter (Signed)
Patient called to inform that his sister in law tested positive for Covid on Saturday and he was with the family on Friday. Has his procedure scheduled for 10/19/20. Please advise.

## 2020-10-19 ENCOUNTER — Encounter: Payer: Medicare HMO | Admitting: Gastroenterology

## 2020-10-26 ENCOUNTER — Other Ambulatory Visit: Payer: Self-pay

## 2020-10-26 ENCOUNTER — Ambulatory Visit (HOSPITAL_COMMUNITY)
Admission: RE | Admit: 2020-10-26 | Discharge: 2020-10-26 | Disposition: A | Discharge status: Home / Self Care | Payer: Medicare HMO | Source: Ambulatory Visit | Attending: Cardiology | Admitting: Cardiology

## 2020-10-26 DIAGNOSIS — I5022 Chronic systolic (congestive) heart failure: Secondary | ICD-10-CM | POA: Diagnosis present

## 2020-10-26 LAB — BASIC METABOLIC PANEL
Anion gap: 6 (ref 5–15)
BUN: 10 mg/dL (ref 8–23)
CO2: 27 mmol/L (ref 22–32)
Calcium: 8.7 mg/dL — ABNORMAL LOW (ref 8.9–10.3)
Chloride: 105 mmol/L (ref 98–111)
Creatinine, Ser: 1.09 mg/dL (ref 0.61–1.24)
GFR, Estimated: >60 mL/min (ref >60)
Glucose, Bld: 87 mg/dL (ref 70–99)
Potassium: 4 mmol/L (ref 3.5–5.1)
Sodium: 138 mmol/L (ref 135–145)

## 2020-10-31 ENCOUNTER — Encounter: Payer: Self-pay | Admitting: Gastroenterology

## 2020-11-02 ENCOUNTER — Other Ambulatory Visit (HOSPITAL_COMMUNITY): Payer: Self-pay | Admitting: Cardiology

## 2020-11-17 ENCOUNTER — Other Ambulatory Visit: Payer: Self-pay | Admitting: Gastroenterology

## 2020-12-08 ENCOUNTER — Encounter (HOSPITAL_COMMUNITY): Payer: Self-pay | Admitting: Cardiology

## 2020-12-08 ENCOUNTER — Other Ambulatory Visit: Payer: Self-pay

## 2020-12-08 ENCOUNTER — Other Ambulatory Visit (HOSPITAL_COMMUNITY): Payer: Self-pay

## 2020-12-08 ENCOUNTER — Ambulatory Visit (HOSPITAL_COMMUNITY)
Admission: RE | Admit: 2020-12-08 | Discharge: 2020-12-08 | Disposition: A | Discharge status: Home / Self Care | Payer: Medicare HMO | Source: Ambulatory Visit | Attending: Cardiology | Admitting: Cardiology

## 2020-12-08 VITALS — BP 118/70 | HR 70 | Wt 194.4 lb

## 2020-12-08 DIAGNOSIS — Z79899 Other long term (current) drug therapy: Secondary | ICD-10-CM | POA: Insufficient documentation

## 2020-12-08 DIAGNOSIS — N189 Chronic kidney disease, unspecified: Secondary | ICD-10-CM | POA: Diagnosis not present

## 2020-12-08 DIAGNOSIS — Z8249 Family history of ischemic heart disease and other diseases of the circulatory system: Secondary | ICD-10-CM | POA: Insufficient documentation

## 2020-12-08 DIAGNOSIS — I428 Other cardiomyopathies: Secondary | ICD-10-CM | POA: Diagnosis not present

## 2020-12-08 DIAGNOSIS — I5022 Chronic systolic (congestive) heart failure: Secondary | ICD-10-CM

## 2020-12-08 DIAGNOSIS — G4733 Obstructive sleep apnea (adult) (pediatric): Secondary | ICD-10-CM | POA: Insufficient documentation

## 2020-12-08 DIAGNOSIS — E785 Hyperlipidemia, unspecified: Secondary | ICD-10-CM | POA: Insufficient documentation

## 2020-12-08 DIAGNOSIS — I130 Hypertensive heart and chronic kidney disease with heart failure and stage 1 through stage 4 chronic kidney disease, or unspecified chronic kidney disease: Secondary | ICD-10-CM | POA: Insufficient documentation

## 2020-12-08 DIAGNOSIS — Z7984 Long term (current) use of oral hypoglycemic drugs: Secondary | ICD-10-CM | POA: Diagnosis not present

## 2020-12-08 LAB — MAGNESIUM: Magnesium: 1.9 mg/dL (ref 1.7–2.4)

## 2020-12-08 LAB — BASIC METABOLIC PANEL
Anion gap: 8 (ref 5–15)
BUN: 14 mg/dL (ref 8–23)
CO2: 25 mmol/L (ref 22–32)
Calcium: 8.9 mg/dL (ref 8.9–10.3)
Chloride: 104 mmol/L (ref 98–111)
Creatinine, Ser: 1.11 mg/dL (ref 0.61–1.24)
GFR, Estimated: >60 mL/min (ref >60)
Glucose, Bld: 89 mg/dL (ref 70–99)
Potassium: 4.1 mmol/L (ref 3.5–5.1)
Sodium: 137 mmol/L (ref 135–145)

## 2020-12-08 LAB — BRAIN NATRIURETIC PEPTIDE: B Natriuretic Peptide: 16.9 pg/mL (ref 0.0–100.0)

## 2020-12-08 MED ORDER — DAPAGLIFLOZIN PROPANEDIOL 10 MG PO TABS: 10.0000 mg | ORAL_TABLET | Freq: Every day | ORAL | 11 refills | Status: DC

## 2020-12-08 NOTE — Patient Instructions (Signed)
Labs done today. We will contact you only if your labs are abnormal.  START Farxiga '10mg'$  (1 tablet) by mouth daily.   No other medication changes were made. Please continue all current medications as prescribed.  You have been referred to Genetics. They will contact you to schedule your appointment.  Your physician recommends that you schedule a follow-up appointment in: 10 days for a lab only appointment and in 4 months with our APP Clinic here in our office.    If you have any questions or concerns before your next appointment please send [place] a message through [place] or call our office at [phone].    TO LEAVE A MESSAGE FOR THE NURSE SELECT OPTION 2, PLEASE LEAVE A MESSAGE INCLUDING: YOUR NAME DATE OF BIRTH CALL BACK NUMBER REASON FOR CALL**this is important as we prioritize the call backs  YOU WILL RECEIVE A CALL BACK THE SAME DAY AS LONG AS YOU CALL BEFORE 4:00 PM   Do the following things EVERYDAY: Weigh yourself in the morning before breakfast. Write it down and keep it in a log. Take your medicines as prescribed Eat low salt foods--Limit salt (sodium) to 2000 mg per day.  Stay as active as you can everyday Limit all fluids for the day to less than 2 liters   At the [facility], you and your health needs are our priority. As part of our continuing mission to provide you with exceptional heart care, we have created designated Provider Care Teams. These Care Teams include your primary Cardiologist (physician) and Advanced Practice Providers (APPs- Physician Assistants and Nurse Practitioners) who all work together to provide you with the care you need, when you need it.   You may see any of the following providers on your designated Care Team at your next follow up: Dr Glori Bickers Dr Haynes Kerns, NP Lyda Jester, [place] Audry Riles, PharmD   Please be sure to bring in all your medications bottles to every appointment.

## 2020-12-08 NOTE — Progress Notes (Signed)
Patient ID: [name] [name], male   DOB: Dec 31, 1950, 70 y.o.   MRN: [id number] Pulmonary: Dr Elsworth Soho PCP: Imagene Riches, NP Cardiology: Dr. Aundra Dubin  70 y.o. with history of nonischemic cardiomyopathy presents for followup of CHF.  Echo in 12/20 showed EF 40-45%.  This is mildly lower than prior.  He had been drinking more.   Echo in 4/22 showed EF up to 50-55%, normal RV.  Patient had [employer] gene testing for cardiomyopathy given brother with NICM and ICD at young age.  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   He has cut back a lot on ETOH and now drinks very little.  Still going to the gym regularly.  He generally is able to do all his activities without dyspnea, but sometimes will wheeze and cough at night, no particular trigger.  He snores and has daytime sleepiness, has been diagnosed with OSA but cannot tolerate CPAP. No chest pain.   Labs (12/09): SPEP negative, HIV negative, transferrin saturation 29%, BNP 24 Labs (9/10): direct LDL 54, HDL 13.8, triglycerides 1470, creatinine 0.8 Labs (05/11/09): BNP 27, WBCs 5.6 Labs (5/11): K 4.6, creatinine 1.2, TGs 238, LDL 108, HDL 34 Labs (8/11): K 4.3, creatinine 1.4, BNP 12.6 Labs (2/12): K 4.1, creatinine 1.7, LDL 102 Labs (8/12): K 4.7, creatinine 1.5 Labs (4/13): LDL 132, HDL 33.4, AST 41, ALT 63 Labs (7/13): K 4, creatinine 1.2, ALT 59, AST 36, HCV negative Labs (1/14): K 4.1, creatinine 1.5 Labs (04/24/14): Cholesterol 222, TGL 265, HDL 37, LDL 169 Labs (4/16): LDL 110, HDl 38 Labs (9/16): LDL 116, HDL 36, TGs 175 Labs (01/2015): K 3.8 Creatinine 1.2 => 1.08, HCT 37 Labs (3/17): creatinine 1.07, BNP 8.9 Labs (6/17): K 4.2, creatinine 1.0 Labs (9/18): TGs 215, LDL 106, HDL 40 Labs (12/18): K 4.4, creatinine 1.09 Labs (12/19): LDL 149 Labs (12/20): LDL 100 Labs (1/21): K 4.4, creatinine 1.15 Labs (6/21): K 4.3, creatinine 1.16 Labs (7/22): K 4,  creatinine 1.09  Allergies (verified):  No Known Drug Allergies  Past Medical History: 1. Nonischemic cardiomyopathy.  The patient had a left heart catheterization done in March 2004 with normal coronaries and then in November 2008, he had a Myoview done that showed an EF of 39% with no ischemia.  Echo in April 2008 showed an EF of 35-40%, moderate diffuse hypokinesis, mild left atrial enlargement, normal RV size and function and essentially normal valves.  The cause of his cardiomyopathy has not been discovered. He drinks but not extremely heavily.  He has never used cocaine and amphetamines.  HIV, SPEP, and ferritin/Fe studies were all negative in 12/09.  Echo (9/10): EF 40%, mild to moderate global HK, mild LAE.  Myoview (5/11): EF 42% with apical thinning but no evidence for ischemia or infarction.  Echo (3/12): EF 45-50%, no significant valvular abnormalities. Echo (1/16) with EF 35-40%, diffuse hypokinesis.  - Cardiac MRI (3/17) with EF 43%, mild diffuse hypokinesis, mid-wall LGE in the mid septum, possible prior viral myocarditis.  - Echo (10/18): EF 50-55%, normal RV size and systolic function.  - Echo (12/20): EF 40-45%, normal RV.  - Echo (4/22): EF 50-55%, normal RV.  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  2. Hypertriglyceridemia 3. Rotator cuff tendonitis with a history of bilateral shoulder surgery. 4. Hypertension. 5. History of a back surgery. 6. Knee osteoarthritis,  s/p arthroscopy 2010 7. Gastroesophageal reflux disease.  EGD (1/12) with erosive esophagitis and gastritis, biopsy positive for H pylori (being treated). 8. Colonoscopy (1/12) with mild diverticulosis and hemorrhoids.  9. Allergic rhinitis 10. Gynecomastia with spironolactone (ok on eplerenone).  11. Probable mild intermittent asthma 12. Elevated LFTs: HCV negative, possibly due to ETOH 13. CKD 14. OSA: CPAP.  15. H/o  herniated nucleus pulposus: s/p surgery 6/17.  Family History: No premature CAD.  Father with prostate cancer Mother with CVA Brother with nonischemic cardiomyopathy, has ICD Grandmother with cancer (unsure what)    Social History: The patient denies smoking.  Lives in [place].  He does not use any drugs. He has never used any illicit drugs other than marijuana, which he smoked occasionally in the distant past.  Moderate ETOH. He is on disability. He is divorced with two sons.     ROS:  All systems reviewed and negative except as per HPI.    Current Outpatient Medications  Medication Sig Dispense Refill   carvedilol (COREG) 12.5 MG tablet Take 1 tablet by mouth twice daily 180 tablet 3   cetirizine (ZYRTEC) 10 MG tablet Take 10 mg by mouth daily as needed for allergies or rhinitis.     dapagliflozin propanediol (FARXIGA) 10 MG TABS tablet Take 1 tablet (10 mg total) by mouth daily before breakfast. 30 tablet 11   eplerenone (INSPRA) 50 MG tablet Take 1 tablet (50 mg total) by mouth daily. 30 tablet 3   escitalopram (LEXAPRO) 20 MG tablet Take 20 mg by mouth at bedtime.     HYDROmorphone (DILAUDID) 2 MG tablet Take 2 mg by mouth every 6 (six) hours as needed.     hydrOXYzine (ATARAX/VISTARIL) 25 MG tablet Take 25 mg by mouth daily in the afternoon.     pantoprazole (PROTONIX) 40 MG tablet Take 1 tablet by mouth once daily 90 tablet 0   pregabalin (LYRICA) 100 MG capsule Take 1 capsule (100 mg total) by mouth 2 (two) times daily. 180 capsule 3   rosuvastatin (CRESTOR) 40 MG tablet Take 1 tablet by mouth once daily 90 tablet 1   sacubitril-valsartan (ENTRESTO) 49-51 MG Take 1 tablet by mouth 2 (two) times daily. Needs doctor appointment for further refill. 60 tablet 0   sildenafil (VIAGRA) 100 MG tablet Take 100 mg by mouth as needed for erectile dysfunction.      No current facility-administered medications for this encounter.    BP 118/70   Pulse 70   Wt 88.2 kg (194 lb 6.4 oz)    SpO2 96%   BMI 30.45 kg/m  General: NAD Neck: No JVD, no thyromegaly or thyroid nodule.  Lungs: Clear to auscultation bilaterally with normal respiratory effort. CV: Nondisplaced PMI.  Heart regular S1/S2, no S3/S4, no murmur.  No peripheral edema.  No carotid bruit.  Normal pedal pulses.  Abdomen: Soft, nontender, no hepatosplenomegaly, no distention.  Skin: Intact without lesions or rashes.  Neurologic: Alert and oriented x 3.  Psych: Normal affect. Extremities: No clubbing or cyanosis.  HEENT: Normal.   Assessment/Plan:  1. Chronic HF with mid range EF: Nonischemic cardiomyopathy, ECHO 04/2014 with EF down to 35-40%.  Cardiac MRI in 3/17, however, showed EF 43% with mid-wall pattern of LGE in the septum that may be suggestive of prior myocarditis.  Echo in 10/18 showed EF up to 50-55. HIV negative, SPEP negative, no evidence for hemochromatosis. 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  Echo in 12/20 showed EF down to 40-45%.  Echo in 4/22 showed EF back up to 50-55% (he has cut back on ETOH).  NYHA I-II. Volume status stable.  - Continue to keep ETOH at a minimum.  - Continue eplerenone 50 mg daily, BMET today.  - Continue carvedilol 12.5 mg twice a day.  - Continue Entresto 49/51 bid.  - He does not need Lasix.  - Add Farxiga 10 mg daily. BMET today and in 10 days.  - I will get him an appt with Dr. Lattie Corns to go over the significance of the titin gene mutation.  2. Hyperlipidemia: He is on Crestor.  3. OSA: Unable to tolerate CPAP.   Followup in 4 months with APP.   Loralie Champagne 12/08/2020

## 2020-12-09 ENCOUNTER — Telehealth (HOSPITAL_COMMUNITY): Payer: Self-pay | Admitting: Pharmacy Technician

## 2020-12-09 NOTE — Telephone Encounter (Signed)
Advanced Heart Failure Patient Advocate Encounter  Patient was seen in clinic yesterday and started on Farxiga. Was able to obtain a PAN HF grant to help cover the cost.  Member ID: [id number] Group ID: [id number] RxBin ID: [id number] PCN: [id number] Eligibility Start Date: 09/10/2020 Eligibility End Date: 09/09/2021 Assistance Amount: $1,000.00

## 2020-12-20 ENCOUNTER — Other Ambulatory Visit (HOSPITAL_COMMUNITY): Payer: Self-pay | Admitting: Cardiology

## 2020-12-21 ENCOUNTER — Other Ambulatory Visit (HOSPITAL_COMMUNITY): Payer: Medicare HMO

## 2020-12-29 ENCOUNTER — Other Ambulatory Visit: Payer: Self-pay

## 2020-12-29 ENCOUNTER — Encounter: Payer: Self-pay | Admitting: Gastroenterology

## 2020-12-29 ENCOUNTER — Ambulatory Visit (AMBULATORY_SURGERY_CENTER): Payer: Medicare HMO | Admitting: Gastroenterology

## 2020-12-29 VITALS — BP 112/56 | HR 65 | Temp 97.8°F | Resp 14 | Ht 67.5 in | Wt 192.0 lb

## 2020-12-29 DIAGNOSIS — D124 Benign neoplasm of descending colon: Secondary | ICD-10-CM

## 2020-12-29 DIAGNOSIS — R12 Heartburn: Secondary | ICD-10-CM | POA: Diagnosis not present

## 2020-12-29 DIAGNOSIS — K449 Diaphragmatic hernia without obstruction or gangrene: Secondary | ICD-10-CM

## 2020-12-29 DIAGNOSIS — K2970 Gastritis, unspecified, without bleeding: Secondary | ICD-10-CM

## 2020-12-29 DIAGNOSIS — Z1211 Encounter for screening for malignant neoplasm of colon: Secondary | ICD-10-CM

## 2020-12-29 DIAGNOSIS — K319 Disease of stomach and duodenum, unspecified: Secondary | ICD-10-CM

## 2020-12-29 DIAGNOSIS — R0989 Other specified symptoms and signs involving the circulatory and respiratory systems: Secondary | ICD-10-CM

## 2020-12-29 DIAGNOSIS — R6889 Other general symptoms and signs: Secondary | ICD-10-CM

## 2020-12-29 MED ORDER — SODIUM CHLORIDE 0.9 % IV SOLN: 500.0000 mL | Freq: Once | INTRAVENOUS | Status: DC

## 2020-12-29 NOTE — Patient Instructions (Signed)
Thank you for letting us take care of your healthcare needs today. Please see handout given to you on Polyps and Gastritis.   YOU HAD AN ENDOSCOPIC PROCEDURE TODAY AT [facility]:   Refer to the procedure report that was given to you for any specific questions about what was found during the examination.  If the procedure report does not answer your questions, please call your gastroenterologist to clarify.  If you requested that your care partner not be given the details of your procedure findings, then the procedure report has been included in a sealed envelope for you to review at your convenience later.  YOU SHOULD EXPECT: Some feelings of bloating in the abdomen. Passage of more gas than usual.  Walking can help get rid of the air that was put into your GI tract during the procedure and reduce the bloating. If you had a lower endoscopy (such as a colonoscopy or flexible sigmoidoscopy) you may notice spotting of blood in your stool or on the toilet paper. If you underwent a bowel prep for your procedure, you may not have a normal bowel movement for a few days.  Please Note:  You might notice some irritation and congestion in your nose or some drainage.  This is from the oxygen used during your procedure.  There is no need for concern and it should clear up in a day or so.  SYMPTOMS TO REPORT IMMEDIATELY:  Following lower endoscopy (colonoscopy or flexible sigmoidoscopy):  Excessive amounts of blood in the stool  Significant tenderness or worsening of abdominal pains  Swelling of the abdomen that is new, acute  Fever of 100F or higher   For urgent or emergent issues, a gastroenterologist can be reached at any hour by calling [phone]. Do not use MyChart messaging for urgent concerns.    DIET:  We do recommend a small meal at first, but then you may proceed to your regular diet.  Drink plenty of fluids but you should avoid alcoholic beverages for 24  hours.  ACTIVITY:  You should plan to take it easy for the rest of today and you should NOT DRIVE or use heavy machinery until tomorrow (because of the sedation medicines used during the test).    FOLLOW UP: Our staff will call the number listed on your records 48-72 hours following your procedure to check on you and address any questions or concerns that you may have regarding the information given to you following your procedure. If we do not reach you, we will leave a message.  We will attempt to reach you two times.  During this call, we will ask if you have developed any symptoms of COVID 19. If you develop any symptoms (ie: fever, flu-like symptoms, shortness of breath, cough etc.) before then, please call [phone].  If you test positive for Covid 19 in the 2 weeks post procedure, please call and report this information to [place].    If any biopsies were taken you will be contacted by phone or by letter within the next 1-3 weeks.  Please call us at [phone] if you have not heard about the biopsies in 3 weeks.    SIGNATURES/CONFIDENTIALITY: You and/or your care partner have signed paperwork which will be entered into your electronic medical record.  These signatures attest to the fact that that the information above on your After Visit Summary has been reviewed and is understood.  Full responsibility of the confidentiality of this discharge information lies  with you and/or your care-partner.  

## 2020-12-29 NOTE — Progress Notes (Signed)
Pt in recovery with monitors in place, VSS. Report given to receiving RN. Bite guard was placed with pt awake to ensure comfort. No dental or soft tissue damage noted. RN will remove the guard when the pt is awake.  

## 2020-12-29 NOTE — Progress Notes (Signed)
HPI: This is a man at routine risk for CRC, and also chronic throat clearing   ROS: complete GI ROS as described in HPI, all other review negative.  Constitutional:  No unintentional weight loss   Past Medical History:  Diagnosis Date   Allergic rhinitis    Allergy    Anxiety    Asthma    Probable mild intermittent asthma   Cataract    CHF (congestive heart failure) (HCC)    CKD (chronic kidney disease)    Diverticulosis    a. Colonoscopy (1/12) with mild diverticulosis and hemorrhoids   Elevated LFTs    HCV negative, possibly due to ETOH   GERD (gastroesophageal reflux disease)    EGD 1/12 with erosive esophagitis and gastritis, biopsy postiive for H pylori (being  treated)   Gynecomastia    with spironolactone (ok on eplerenone)   History of back surgery    HTN (hypertension)    Hypertriglyceridemia    Knee osteoarthritis    s/p arhtroscopy 2010   Nonischemic cardiomyopathy ([place])    [place] 06/2002 with normal cors; Myoview 11/08 neg for ischemia. Echo 4/08: EF of 35-40%.  Cause of NICM unknown.  Never a heavy drinker, used cocaine or amphetamines. HIV, SPEP, and ferritin/Fe studies were all negative in 12/09. Myoview (5/11): EF 42% with apical thinning but no evidence for ischemia or infarction. Echo (3/12): EF 45-50%, no significant valvular abnormalities.    Rotator cuff tendonitis    with hx of bilateral shoulder surgery   Sleep apnea    doesnot use cpap    Past Surgical History:  Procedure Laterality Date   ELECTROCARDIOGRAM  09/20/06   GANGLION CYST EXCISION Right 09/20/2015   Procedure: REMOVAL GANGLION OF WRIST;  Surgeon: Jessy Oto, MD;  Location: [facility];  Service: Orthopedics;  Laterality: Right;   KNEE ARTHROSCOPY Left    x 2, then partial replacement   LUMBAR LAMINECTOMY Right 09/20/2015   Procedure: FAR LATERAL APPROACH TO EXCISE HERNIATED NUCLEUS PULPOSUS RIGHT Lumbar 1- Lumbar 2 AND RIGHT Lumbar 2-Lumbar 3, EXCISION OF GANGLION CYST RIGHT VOLAR RADIAL WRIST;   Surgeon: Jessy Oto, MD;  Location: [facility];  Service: Orthopedics;  Laterality: Right;   MEDIAL PARTIAL KNEE REPLACEMENT Left    PTCA     ROTATOR CUFF REPAIR     2R; 3 L   stress cardiolite  12/15/05   TRANSTHORACIC ECHOCARDIOGRAM  07/13/06    Current Outpatient Medications  Medication Sig Dispense Refill   carvedilol (COREG) 12.5 MG tablet Take 1 tablet by mouth twice daily 180 tablet 3   eplerenone (INSPRA) 50 MG tablet Take 1 tablet (50 mg total) by mouth daily. 30 tablet 3   escitalopram (LEXAPRO) 20 MG tablet Take 20 mg by mouth at bedtime.     HYDROmorphone (DILAUDID) 2 MG tablet Take 2 mg by mouth every 6 (six) hours as needed.     hydrOXYzine (ATARAX/VISTARIL) 25 MG tablet Take 25 mg by mouth daily in the afternoon.     pantoprazole (PROTONIX) 40 MG tablet Take 1 tablet by mouth once daily 90 tablet 0   pregabalin (LYRICA) 100 MG capsule Take 1 capsule (100 mg total) by mouth 2 (two) times daily. 180 capsule 3   rosuvastatin (CRESTOR) 40 MG tablet Take 1 tablet by mouth once daily 90 tablet 1   sacubitril-valsartan (ENTRESTO) 49-51 MG Take 1 tablet by mouth 2 (two) times daily. Needs doctor appointment for further refill. 60 tablet 0   cetirizine (ZYRTEC)  10 MG tablet Take 10 mg by mouth daily as needed for allergies or rhinitis.     dapagliflozin propanediol (FARXIGA) 10 MG TABS tablet Take 1 tablet (10 mg total) by mouth daily before breakfast. 30 tablet 11   sildenafil (VIAGRA) 100 MG tablet Take 100 mg by mouth as needed for erectile dysfunction.      Current Facility-Administered Medications  Medication Dose Route Frequency Provider Last Rate Last Admin   0.9 %  sodium chloride infusion  500 mL Intravenous Once Milus Banister, MD        Allergies as of 12/29/2020   (No Known Allergies)    Family History  Problem Relation Age of Onset   Stroke Mother    Prostate cancer Father    Pancreatitis Sister    Cardiomyopathy Brother        nonischemic, has icd   Cirrhosis  Brother        alcoholic    Colon cancer Neg Hx    Esophageal cancer Neg Hx    Pancreatic cancer Neg Hx    Liver disease Neg Hx    Colon polyps Neg Hx    Rectal cancer Neg Hx    Stomach cancer Neg Hx     Social History   Socioeconomic History   Marital status: Divorced    Spouse name: Not on file   Number of children: 2   Years of education: Not on file   Highest education level: Not on file  Occupational History   Occupation: Disability  Tobacco Use   Smoking status: Never   Smokeless tobacco: Never   Tobacco comments:    denies   Vaping Use   Vaping Use: Never used  Substance and Sexual Activity   Alcohol use: Yes    Alcohol/week: 6.0 standard drinks    Types: 6 Shots of liquor per week    Comment: weekends   Drug use: No    Comment: Smoked marijuana on occasion in distant past.    Sexual activity: Not Currently  Other Topics Concern   Not on file  Social History Narrative   Not on file   Social Determinants of Health   Financial Resource Strain: Not on file  Food Insecurity: Not on file  Transportation Needs: Not on file  Physical Activity: Not on file  Stress: Not on file  Social Connections: Not on file  Intimate Partner Violence: Not on file     Physical Exam: BP 124/80 (Patient Position: Sitting)   Pulse 71   Temp 97.8 F (36.6 C)   Ht 5' 7.5" (1.715 m)   Wt 192 lb (87.1 kg)   SpO2 97%   BMI 29.63 kg/m  Constitutional: generally well-appearing Psychiatric: alert and oriented x3 Lungs: CTA bilaterally Heart: no MCR  Assessment and plan: 70 y.o. male with routine risk for CRC, and also chronic throat clearing  For screening colonoscoy and EGD today.  He took full prep yesterday rather than split dose as recommended.  Hopefully prep will be adequate.  Care is appropriate for the ambulatory setting.  Owens Loffler, MD [facility] 12/29/2020, 10:01 AM

## 2020-12-29 NOTE — Progress Notes (Signed)
CHECK-IN-AER  V/S-CW  Patient took all prep yesterday,explained to pt. That if there is solid stool that they would not be able to complete exam,he denies solid stool and stated "that's why I started early by not eating".

## 2020-12-29 NOTE — Progress Notes (Signed)
Called to room to assist during endoscopic procedure.  Patient ID and intended procedure confirmed with present staff. Received instructions for my participation in the procedure from the performing physician.  

## 2020-12-29 NOTE — Op Note (Signed)
[facility] Patient Name: Edgar Reisz Procedure Date: 12/29/2020 9:57 AM MRN: [id number] Endoscopist: Milus Banister , MD Age: 70 Referring MD:  Date of Birth: May 22, 1950 Gender: Male Account #: [id number] Procedure:                Upper GI endoscopy Indications:              Heartburn, chronic throat clearing, H. pylori +                            gastritis 2012 Medicines:                Monitored Anesthesia Care Procedure:                Pre-Anesthesia Assessment:                           - Prior to the procedure, a History and Physical                            was performed, and patient medications and                            allergies were reviewed. The patient's tolerance of                            previous anesthesia was also reviewed. The risks                            and benefits of the procedure and the sedation                            options and risks were discussed with the patient.                            All questions were answered, and informed consent                            was obtained. Prior Anticoagulants: The patient has                            taken no previous anticoagulant or antiplatelet                            agents. ASA Grade Assessment: III - A patient with                            severe systemic disease. After reviewing the risks                            and benefits, the patient was deemed in                            satisfactory condition to undergo the procedure.  After obtaining informed consent, the endoscope was                            passed under direct vision. Throughout the                            procedure, the patient's blood pressure, pulse, and                            oxygen saturations were monitored continuously. The                            GIF HQ190 #[id number] was introduced through the                            mouth, and advanced to the second part of  duodenum.                            The upper GI endoscopy was accomplished without                            difficulty. The patient tolerated the procedure                            well. Scope In: Scope Out: Findings:                 Mild inflammation characterized by erythema,                            friability and granularity was found in the gastric                            antrum. Biopsies were taken with a cold forceps for                            histology.                           A small hiatal hernia was present.                           The exam was otherwise without abnormality. Complications:            No immediate complications. Estimated blood loss:                            None. Estimated Blood Loss:     Estimated blood loss: none. Impression:               - Mild, non-specific gastritis. Biopsied to check                            for H. pylori.                           - Small hiatal  hernia.                           - The examination was otherwise normal. Recommendation:           - Patient has a contact number available for                            emergencies. The signs and symptoms of potential                            delayed complications were discussed with the                            patient. Return to normal activities tomorrow.                            Written discharge instructions were provided to the                            patient.                           - Resume previous diet.                           - Continue present medications. Please start taking                            your pepcid (famotidine) at bedtime every night                            rather than just once in a while.                           - Await pathology results. If biopsies are + for H.                            pylori will treat with appropriate antibiotics. Milus Banister, MD 12/29/2020 10:34:08 AM This report has been signed  electronically.

## 2020-12-29 NOTE — Op Note (Signed)
[facility] Patient Name: [name] [name] Procedure Date: 12/29/2020 9:58 AM MRN: [id number] Endoscopist: Milus Banister , MD Age: 70 Referring MD:  Date of Birth: 1951-04-01 Gender: Male Account #: [id number] Procedure:                Colonoscopy Indications:              Screening for colorectal malignant neoplasm Medicines:                Monitored Anesthesia Care Procedure:                Pre-Anesthesia Assessment:                           - Prior to the procedure, a History and Physical                            was performed, and patient medications and                            allergies were reviewed. The patient's tolerance of                            previous anesthesia was also reviewed. The risks                            and benefits of the procedure and the sedation                            options and risks were discussed with the patient.                            All questions were answered, and informed consent                            was obtained. Prior Anticoagulants: The patient has                            taken no previous anticoagulant or antiplatelet                            agents. ASA Grade Assessment: II - A patient with                            mild systemic disease. After reviewing the risks                            and benefits, the patient was deemed in                            satisfactory condition to undergo the procedure.                           After obtaining informed consent, the colonoscope  was passed under direct vision. Throughout the                            procedure, the patient's blood pressure, pulse, and                            oxygen saturations were monitored continuously. The                            Olympus CF-[id number]) Colonoscope was                            introduced through the anus and advanced to the the                            cecum, identified by  appendiceal orifice and                            ileocecal valve. The colonoscopy was performed                            without difficulty. The patient tolerated the                            procedure well. The quality of the bowel                            preparation was adequate. The ileocecal valve,                            appendiceal orifice, and rectum were photographed. Scope In: 10:08:55 AM Scope Out: 10:20:47 AM Scope Withdrawal Time: 0 hours 9 minutes 10 seconds  Total Procedure Duration: 0 hours 11 minutes 52 seconds  Findings:                 A 5 mm polyp was found in the descending colon. The                            polyp was sessile. The polyp was removed with a                            cold snare. Resection and retrieval were complete.                           The exam was otherwise without abnormality on                            direct and retroflexion views. Complications:            No immediate complications. Estimated blood loss:                            None. Estimated Blood Loss:     Estimated blood loss: none. Impression:               -  One 5 mm polyp in the descending colon, removed                            with a cold snare. Resected and retrieved.                           - The examination was otherwise normal on direct                            and retroflexion views. Recommendation:           - Await path resutls.                           - EGD now. Milus Banister, MD 12/29/2020 10:22:41 AM This report has been signed electronically.

## 2020-12-31 ENCOUNTER — Telehealth: Payer: Self-pay

## 2020-12-31 ENCOUNTER — Telehealth: Payer: Self-pay | Admitting: *Deleted

## 2020-12-31 NOTE — Telephone Encounter (Signed)
Attempted to call patient for their post-procedure follow-up call. No answer. Left voicemail.   

## 2020-12-31 NOTE — Telephone Encounter (Signed)
Left message on follow up call. 

## 2021-01-04 ENCOUNTER — Encounter: Payer: Self-pay | Admitting: Gastroenterology

## 2021-01-31 ENCOUNTER — Other Ambulatory Visit (HOSPITAL_COMMUNITY): Payer: Self-pay

## 2021-01-31 MED ORDER — ENTRESTO 49-51 MG PO TABS: 1.0000 | ORAL_TABLET | Freq: Two times a day (BID) | ORAL | 0 refills | Status: DC

## 2021-02-14 ENCOUNTER — Other Ambulatory Visit: Payer: Self-pay | Admitting: Gastroenterology

## 2021-03-16 ENCOUNTER — Other Ambulatory Visit (HOSPITAL_COMMUNITY): Payer: Self-pay | Admitting: Cardiology

## 2021-04-01 NOTE — Progress Notes (Incomplete)
Patient ID: [name] [name], male   DOB: 21-Dec-1950, 70 y.o.   MRN: [id number] Pulmonary: Dr Elsworth Soho PCP: Imagene Riches, NP Cardiology: Dr. Aundra Dubin  70 y.o. with history of nonischemic cardiomyopathy presents for followup of CHF.  Echo in 12/20 showed EF 40-45%.  This is mildly lower than prior.  He had been drinking more.   Echo in 4/22 showed EF up to 50-55%, normal RV.  Patient had [employer] gene testing for cardiomyopathy given brother with NICM and ICD at young age.  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   He has cut back a lot on ETOH and now drinks very little.  Still going to the gym regularly.  He generally is able to do all his activities without dyspnea, but sometimes will wheeze and cough at night, no particular trigger.  He snores and has daytime sleepiness, has been diagnosed with OSA but cannot tolerate CPAP. No chest pain.   Labs (12/09): SPEP negative, HIV negative, transferrin saturation 29%, BNP 24 Labs (9/10): direct LDL 54, HDL 13.8, triglycerides 1470, creatinine 0.8 Labs (05/11/09): BNP 27, WBCs 5.6 Labs (5/11): K 4.6, creatinine 1.2, TGs 238, LDL 108, HDL 34 Labs (8/11): K 4.3, creatinine 1.4, BNP 12.6 Labs (2/12): K 4.1, creatinine 1.7, LDL 102 Labs (8/12): K 4.7, creatinine 1.5 Labs (4/13): LDL 132, HDL 33.4, AST 41, ALT 63 Labs (7/13): K 4, creatinine 1.2, ALT 59, AST 36, HCV negative Labs (1/14): K 4.1, creatinine 1.5 Labs (04/24/14): Cholesterol 222, TGL 265, HDL 37, LDL 169 Labs (4/16): LDL 110, HDl 38 Labs (9/16): LDL 116, HDL 36, TGs 175 Labs (01/2015): K 3.8 Creatinine 1.2 => 1.08, HCT 37 Labs (3/17): creatinine 1.07, BNP 8.9 Labs (6/17): K 4.2, creatinine 1.0 Labs (9/18): TGs 215, LDL 106, HDL 40 Labs (12/18): K 4.4, creatinine 1.09 Labs (12/19): LDL 149 Labs (12/20): LDL 100 Labs (1/21): K 4.4, creatinine 1.15 Labs (6/21): K 4.3, creatinine 1.16 Labs (7/22): K 4,  creatinine 1.09  Allergies (verified):  No Known Drug Allergies  Past Medical History: 1. Nonischemic cardiomyopathy.  The patient had a left heart catheterization done in March 2004 with normal coronaries and then in November 2008, he had a Myoview done that showed an EF of 39% with no ischemia.  Echo in April 2008 showed an EF of 35-40%, moderate diffuse hypokinesis, mild left atrial enlargement, normal RV size and function and essentially normal valves.  The cause of his cardiomyopathy has not been discovered. He drinks but not extremely heavily.  He has never used cocaine and amphetamines.  HIV, SPEP, and ferritin/Fe studies were all negative in 12/09.  Echo (9/10): EF 40%, mild to moderate global HK, mild LAE.  Myoview (5/11): EF 42% with apical thinning but no evidence for ischemia or infarction.  Echo (3/12): EF 45-50%, no significant valvular abnormalities. Echo (1/16) with EF 35-40%, diffuse hypokinesis.  - Cardiac MRI (3/17) with EF 43%, mild diffuse hypokinesis, mid-wall LGE in the mid septum, possible prior viral myocarditis.  - Echo (10/18): EF 50-55%, normal RV size and systolic function.  - Echo (12/20): EF 40-45%, normal RV.  - Echo (4/22): EF 50-55%, normal RV.  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  2. Hypertriglyceridemia 3. Rotator cuff tendonitis with a history of bilateral shoulder surgery. 4. Hypertension. 5. History of a back surgery. 6. Knee osteoarthritis,  s/p arthroscopy 2010 7. Gastroesophageal reflux disease.  EGD (1/12) with erosive esophagitis and gastritis, biopsy positive for H pylori (being treated). 8. Colonoscopy (1/12) with mild diverticulosis and hemorrhoids.  9. Allergic rhinitis 10. Gynecomastia with spironolactone (ok on eplerenone).  11. Probable mild intermittent asthma 12. Elevated LFTs: HCV negative, possibly due to ETOH 13. CKD 14. OSA: CPAP.  15. H/o  herniated nucleus pulposus: s/p surgery 6/17.  Family History: No premature CAD.  Father with prostate cancer Mother with CVA Brother with nonischemic cardiomyopathy, has ICD Grandmother with cancer (unsure what)    Social History: The patient denies smoking.  Lives in [place].  He does not use any drugs. He has never used any illicit drugs other than marijuana, which he smoked occasionally in the distant past.  Moderate ETOH. He is on disability. He is divorced with two sons.     ROS:  All systems reviewed and negative except as per HPI.    Current Outpatient Medications  Medication Sig Dispense Refill   carvedilol (COREG) 12.5 MG tablet Take 1 tablet by mouth twice daily 180 tablet 3   cetirizine (ZYRTEC) 10 MG tablet Take 10 mg by mouth daily as needed for allergies or rhinitis.     dapagliflozin propanediol (FARXIGA) 10 MG TABS tablet Take 1 tablet (10 mg total) by mouth daily before breakfast. 30 tablet 11   eplerenone (INSPRA) 50 MG tablet TAKE ONE TABLET BY MOUTH DAILY 30 tablet 3   escitalopram (LEXAPRO) 20 MG tablet Take 20 mg by mouth at bedtime.     HYDROmorphone (DILAUDID) 2 MG tablet Take 2 mg by mouth every 6 (six) hours as needed.     hydrOXYzine (ATARAX/VISTARIL) 25 MG tablet Take 25 mg by mouth daily in the afternoon.     pantoprazole (PROTONIX) 40 MG tablet Take 1 tablet by mouth once daily 90 tablet 3   pregabalin (LYRICA) 100 MG capsule Take 1 capsule (100 mg total) by mouth 2 (two) times daily. 180 capsule 3   rosuvastatin (CRESTOR) 40 MG tablet Take 1 tablet by mouth once daily 90 tablet 1   sacubitril-valsartan (ENTRESTO) 49-51 MG Take 1 tablet by mouth 2 (two) times daily. Needs doctor appointment for further refill. 60 tablet 0   sildenafil (VIAGRA) 100 MG tablet Take 100 mg by mouth as needed for erectile dysfunction.      No current facility-administered medications for this visit.    There were no vitals taken for this visit. General: NAD Neck: No  JVD, no thyromegaly or thyroid nodule.  Lungs: Clear to auscultation bilaterally with normal respiratory effort. CV: Nondisplaced PMI.  Heart regular S1/S2, no S3/S4, no murmur.  No peripheral edema.  No carotid bruit.  Normal pedal pulses.  Abdomen: Soft, nontender, no hepatosplenomegaly, no distention.  Skin: Intact without lesions or rashes.  Neurologic: Alert and oriented x 3.  Psych: Normal affect. Extremities: No clubbing or cyanosis.  HEENT: Normal.   Assessment/Plan:  1. Chronic HF with mid range EF: Nonischemic cardiomyopathy, ECHO 04/2014 with EF down to 35-40%.  Cardiac MRI in 3/17, however, showed EF 43% with mid-wall pattern of LGE in the septum that may be suggestive of prior myocarditis.  Echo in 10/18 showed EF up to 50-55. HIV negative, SPEP negative, no evidence for hemochromatosis. Would also consider familial cardiomyopathy as his brother had cardiomyopathy with SCD at a young age, says his mother's family has multiple members with CHF => [employer] gene testing showed titin gene mutation heterozygote, can be associated  with autosomal dominant dilated cardiomyopathy.  Echo in 12/20 showed EF down to 40-45%.  Echo in 4/22 showed EF back up to 50-55% (he has cut back on ETOH).  NYHA I-II. Volume status stable.  - Continue to keep ETOH at a minimum.  - Continue eplerenone 50 mg daily, BMET today.  - Continue carvedilol 12.5 mg twice a day.  - Continue Entresto 49/51 bid.  - He does not need Lasix.  - Add Farxiga 10 mg daily. BMET today and in 10 days.  - I will get him an appt with Dr. Lattie Corns to go over the significance of the titin gene mutation.  2. Hyperlipidemia: He is on Crestor.  3. OSA: Unable to tolerate CPAP.   Followup in 4 months with APP.   [place] 04/01/2021

## 2021-04-05 ENCOUNTER — Telehealth (HOSPITAL_COMMUNITY): Payer: Self-pay

## 2021-04-05 NOTE — Telephone Encounter (Signed)
Called and left patient a voicemail to confirm/remind patient of their appointment at the [facility] on 04/06/21.   I also left a vm for patient to bring all medications and/or complete list and to give our office a call back if needed.

## 2021-04-06 ENCOUNTER — Encounter (HOSPITAL_COMMUNITY): Payer: Medicare HMO

## 2021-04-07 ENCOUNTER — Other Ambulatory Visit: Payer: Self-pay

## 2021-04-07 ENCOUNTER — Ambulatory Visit (HOSPITAL_COMMUNITY)
Admission: RE | Admit: 2021-04-07 | Discharge: 2021-04-07 | Disposition: A | Discharge status: Home / Self Care | Payer: Medicare HMO | Source: Ambulatory Visit | Attending: Family Medicine | Admitting: Family Medicine

## 2021-04-07 ENCOUNTER — Encounter (HOSPITAL_COMMUNITY): Payer: Self-pay

## 2021-04-07 VITALS — BP 100/58 | HR 77 | Wt 192.2 lb

## 2021-04-07 DIAGNOSIS — G4733 Obstructive sleep apnea (adult) (pediatric): Secondary | ICD-10-CM | POA: Diagnosis not present

## 2021-04-07 DIAGNOSIS — I5022 Chronic systolic (congestive) heart failure: Secondary | ICD-10-CM | POA: Diagnosis not present

## 2021-04-07 DIAGNOSIS — Z8249 Family history of ischemic heart disease and other diseases of the circulatory system: Secondary | ICD-10-CM | POA: Insufficient documentation

## 2021-04-07 DIAGNOSIS — I110 Hypertensive heart disease with heart failure: Secondary | ICD-10-CM | POA: Insufficient documentation

## 2021-04-07 DIAGNOSIS — E785 Hyperlipidemia, unspecified: Secondary | ICD-10-CM

## 2021-04-07 DIAGNOSIS — Z1589 Genetic susceptibility to other disease: Secondary | ICD-10-CM

## 2021-04-07 DIAGNOSIS — Z79899 Other long term (current) drug therapy: Secondary | ICD-10-CM | POA: Diagnosis not present

## 2021-04-07 DIAGNOSIS — I428 Other cardiomyopathies: Secondary | ICD-10-CM | POA: Diagnosis not present

## 2021-04-07 LAB — BASIC METABOLIC PANEL
Anion gap: 7 (ref 5–15)
BUN: 13 mg/dL (ref 8–23)
CO2: 23 mmol/L (ref 22–32)
Calcium: 8.9 mg/dL (ref 8.9–10.3)
Chloride: 107 mmol/L (ref 98–111)
Creatinine, Ser: 1.12 mg/dL (ref 0.61–1.24)
GFR, Estimated: >60 mL/min (ref >60)
Glucose, Bld: 99 mg/dL (ref 70–99)
Potassium: 4 mmol/L (ref 3.5–5.1)
Sodium: 137 mmol/L (ref 135–145)

## 2021-04-07 LAB — BRAIN NATRIURETIC PEPTIDE: B Natriuretic Peptide: 20.1 pg/mL (ref 0.0–100.0)

## 2021-04-07 LAB — MAGNESIUM: Magnesium: 2.1 mg/dL (ref 1.7–2.4)

## 2021-04-07 MED ORDER — FUROSEMIDE 20 MG PO TABS: 20.0000 mg | ORAL_TABLET | ORAL | 11 refills | Status: AC | PRN

## 2021-04-07 NOTE — Patient Instructions (Signed)
START Lasix 20 mg one tab as needed for weight gain (3 lbs over night or 5 lbs in a week) Be sure to take the full 10 mg tablet of farxiga  Labs today We will only contact you if something comes back abnormal or we need to make some changes. Otherwise no news is good news!  You have been referred to [facility] -Dr Lattie Corns They will be in contact with an appointment  Your physician recommends that you schedule a follow-up appointment in: 4 months with Dr Aundra Dubin  Do the following things EVERYDAY: Weigh yourself in the morning before breakfast. Write it down and keep it in a log. Take your medicines as prescribed Eat low salt foods--Limit salt (sodium) to 2000 mg per day.  Stay as active as you can everyday Limit all fluids for the day to less than 2 liters  At the [facility], you and your health needs are our priority. As part of our continuing mission to provide you with exceptional heart care, we have created designated Provider Care Teams. These Care Teams include your primary Cardiologist (physician) and Advanced Practice Providers (APPs- Physician Assistants and Nurse Practitioners) who all work together to provide you with the care you need, when you need it.   You may see any of the following providers on your designated Care Team at your next follow up: Dr Glori Bickers Dr Haynes Kerns, NP Lyda Jester, [place] [facility] [place], [place] Audry Riles, PharmD   Please be sure to bring in all your medications bottles to every appointment.

## 2021-04-07 NOTE — Addendum Note (Signed)
Encounter addended by: Rafael Bihari, FNP on: 04/07/2021 5:33 PM  Actions taken: Clinical Note Signed

## 2021-04-07 NOTE — Progress Notes (Addendum)
Patient ID: [name] [name], male   DOB: 1950/11/17, 70 y.o.   MRN: [id number] Pulmonary: Dr Elsworth Soho PCP: Imagene Riches, NP Cardiology: Dr. Aundra Dubin  70 y.o. with history of nonischemic cardiomyopathy presents for followup of CHF.  Echo in 12/20 showed EF 40-45%.  This is mildly lower than prior.  He had been drinking more.   Echo in 4/22 showed EF up to 50-55%, normal RV.  Patient had [employer] gene testing for cardiomyopathy given brother with NICM and ICD at young age.  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   Today he returns for HF follow up. Overall feels ok, but has a lot of life stressors-->his brother is living with him and has OD'd 2x and he required CPR and 16 year old granddaughter also passed away recently. Has a nighttime cough and new PND. Diagnosed with OSA but unable to tolerate CPAP. He denies exertional dyspnea and continues to go to the gym 4-5x/week, mainly using weight machines. Denies palpitations, CP, dizziness, edema. Appetite ok. No fever or chills. Weight at home 185 pounds. Taking all medications. Has been only been taking Farxiga 5 mg as he was afraid of GU side effects. Went 6 weeks w/o ETOH and now drinks on the weekends.  ReDs: 48%  Labs (12/09): SPEP negative, HIV negative, transferrin saturation 29%, BNP 24 Labs (9/10): direct LDL 54, HDL 13.8, triglycerides 1470, creatinine 0.8 Labs (05/11/09): BNP 27, WBCs 5.6 Labs (5/11): K 4.6, creatinine 1.2, TGs 238, LDL 108, HDL 34 Labs (8/11): K 4.3, creatinine 1.4, BNP 12.6 Labs (2/12): K 4.1, creatinine 1.7, LDL 102 Labs (8/12): K 4.7, creatinine 1.5 Labs (4/13): LDL 132, HDL 33.4, AST 41, ALT 63 Labs (7/13): K 4, creatinine 1.2, ALT 59, AST 36, HCV negative Labs (1/14): K 4.1, creatinine 1.5 Labs (04/24/14): Cholesterol 222, TGL 265, HDL 37, LDL 169 Labs (4/16): LDL 110, HDl 38 Labs (9/16): LDL 116, HDL 36, TGs 175 Labs  (01/2015): K 3.8 Creatinine 1.2 => 1.08, HCT 37 Labs (3/17): creatinine 1.07, BNP 8.9 Labs (6/17): K 4.2, creatinine 1.0 Labs (9/18): TGs 215, LDL 106, HDL 40 Labs (12/18): K 4.4, creatinine 1.09 Labs (12/19): LDL 149 Labs (12/20): LDL 100 Labs (1/21): K 4.4, creatinine 1.15 Labs (6/21): K 4.3, creatinine 1.16 Labs (7/22): K 4, creatinine 1.09 Labs (8/22): K 4.1, creatinine 1.11  Allergies (verified):  No Known Drug Allergies  Past Medical History: 1. Nonischemic cardiomyopathy.  The patient had a left heart catheterization done in March 2004 with normal coronaries and then in November 2008, he had a Myoview done that showed an EF of 39% with no ischemia.  Echo in April 2008 showed an EF of 35-40%, moderate diffuse hypokinesis, mild left atrial enlargement, normal RV size and function and essentially normal valves.  The cause of his cardiomyopathy has not been discovered. He drinks but not extremely heavily.  He has never used cocaine and amphetamines.  HIV, SPEP, and ferritin/Fe studies were all negative in 12/09.  Echo (9/10): EF 40%, mild to moderate global HK, mild LAE.  Myoview (5/11): EF 42% with apical thinning but no evidence for ischemia or infarction.  Echo (3/12): EF 45-50%, no significant valvular abnormalities. Echo (1/16) with EF 35-40%, diffuse hypokinesis.  - Cardiac MRI (3/17) with EF 43%, mild diffuse hypokinesis, mid-wall LGE in the mid septum, possible prior viral myocarditis.  - Echo (10/18): EF 50-55%, normal RV size and  systolic function.  - Echo (12/20): EF 40-45%, normal RV.  - Echo (4/22): EF 50-55%, normal RV.  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  2. Hypertriglyceridemia 3. Rotator cuff tendonitis with a history of bilateral shoulder surgery. 4. Hypertension. 5. History of a back surgery. 6. Knee osteoarthritis, s/p arthroscopy 2010 7. Gastroesophageal reflux disease.  EGD  (1/12) with erosive esophagitis and gastritis, biopsy positive for H pylori (being treated). 8. Colonoscopy (1/12) with mild diverticulosis and hemorrhoids.  9. Allergic rhinitis 10. Gynecomastia with spironolactone (ok on eplerenone).  11. Probable mild intermittent asthma 12. Elevated LFTs: HCV negative, possibly due to ETOH 13. CKD 14. OSA: CPAP.  15. H/o herniated nucleus pulposus: s/p surgery 6/17.  Family History: No premature CAD.  Father with prostate cancer Mother with CVA Brother with nonischemic cardiomyopathy, has ICD Grandmother with cancer (unsure what)    Social History: The patient denies smoking.  Lives in [place].  He does not use any drugs. He has never used any illicit drugs other than marijuana, which he smoked occasionally in the distant past.  Moderate ETOH. He is on disability. He is divorced with two sons.     ROS:  All systems reviewed and negative except as per HPI.    Current Outpatient Medications  Medication Sig Dispense Refill   carvedilol (COREG) 12.5 MG tablet Take 1 tablet by mouth twice daily 180 tablet 3   cetirizine (ZYRTEC) 10 MG tablet Take 10 mg by mouth daily as needed for allergies or rhinitis.     dapagliflozin propanediol (FARXIGA) 10 MG TABS tablet Take 1 tablet (10 mg total) by mouth daily before breakfast. 30 tablet 11   eplerenone (INSPRA) 50 MG tablet TAKE ONE TABLET BY MOUTH DAILY 30 tablet 3   escitalopram (LEXAPRO) 20 MG tablet Take 20 mg by mouth at bedtime.     HYDROmorphone (DILAUDID) 2 MG tablet Take 2 mg by mouth every 6 (six) hours as needed.     hydrOXYzine (ATARAX/VISTARIL) 25 MG tablet Take 25 mg by mouth daily in the afternoon.     pantoprazole (PROTONIX) 40 MG tablet Take 1 tablet by mouth once daily 90 tablet 3   pregabalin (LYRICA) 100 MG capsule Take 1 capsule (100 mg total) by mouth 2 (two) times daily. 180 capsule 3   rosuvastatin (CRESTOR) 40 MG tablet Take 1 tablet by mouth once daily 90 tablet 1    sacubitril-valsartan (ENTRESTO) 49-51 MG Take 1 tablet by mouth 2 (two) times daily. Needs doctor appointment for further refill. 60 tablet 0   sildenafil (VIAGRA) 100 MG tablet Take 100 mg by mouth as needed for erectile dysfunction.      No current facility-administered medications for this encounter.   Wt Readings from Last 3 Encounters:  04/07/21 87.2 kg (192 lb 3.2 oz)  12/29/20 87.1 kg (192 lb)  12/08/20 88.2 kg (194 lb 6.4 oz)   BP (!) 100/58 (BP Location: Right Arm, Patient Position: Sitting)    Pulse 77    Wt 87.2 kg (192 lb 3.2 oz)    SpO2 97%    BMI 29.66 kg/m   General:  NAD. No resp difficulty HEENT: Normal Neck: Supple. No JVD. Carotids 2+ bilat; no bruits. No lymphadenopathy or thryomegaly appreciated. Cor: PMI nondisplaced. Regular rate & rhythm. No rubs, gallops or murmurs. Lungs: Clear Abdomen: Soft, nontender, nondistended. No hepatosplenomegaly. No bruits or masses. Good bowel sounds. Extremities: No cyanosis, clubbing, rash,  edema Neuro: Alert & oriented x 3, cranial nerves grossly intact. Moves all 4 extremities w/o difficulty. Affect pleasant.  Assessment/Plan: 1. Chronic HF with mid range EF: Nonischemic cardiomyopathy, ECHO 04/2014 with EF down to 35-40%.  Cardiac MRI in 3/17, however, showed EF 43% with mid-wall pattern of LGE in the septum that may be suggestive of prior myocarditis.  Echo in 10/18 showed EF up to 50-55. HIV negative, SPEP negative, no evidence for hemochromatosis. Would also consider familial cardiomyopathy as his brother had cardiomyopathy with SCD at a young age, says his mother's family has multiple members with CHF => [employer] gene testing showed titin gene mutation heterozygote, can be associated with autosomal dominant dilated cardiomyopathy.  Echo in 12/20 showed EF down to 40-45%.  Echo in 4/22 showed EF back up to 50-55% (he has cut back on ETOH).  NYHA I-II. He is not volume overloaded on exam, ReDs 48% ? Accuracy with his muscular build. -  Continue to keep ETOH at a minimum. Check mag today. - Increase Farxiga to 10 mg daily. - Continue eplerenone 50 mg daily. BMET and BNP today. - Continue carvedilol 12.5 mg bid.  - Continue Entresto 49/51 mg bid. No BP room to increase. - I will give him Rx for Lasix 20 mg daily PRN only for weight gain/edema.  - Refer to Dr. Lattie Corns to go over the significance of the titin gene mutation.  2. Hyperlipidemia: He is on Crestor 40 mg. TGs 292 4/22.  - Repeat lipids next visit. Consider addition of Vascepa if TGs remain elevated. 3. OSA: Unable to tolerate CPAP.   Followup in 4 months with Dr. Wynema Birch [facility] FNP 04/07/2021

## 2021-04-07 NOTE — Progress Notes (Signed)
ReDS Vest / Clip - 04/07/21 1200       ReDS Vest / Clip   Station Marker C    Ruler Value 28    ReDS Value Range High volume overload    ReDS Actual Value 48

## 2021-04-25 ENCOUNTER — Other Ambulatory Visit (HOSPITAL_COMMUNITY): Payer: Self-pay | Admitting: *Deleted

## 2021-04-25 MED ORDER — ENTRESTO 49-51 MG PO TABS: 1.0000 | ORAL_TABLET | Freq: Two times a day (BID) | ORAL | 3 refills | Status: DC

## 2021-05-16 ENCOUNTER — Telehealth (HOSPITAL_COMMUNITY): Payer: Self-pay | Admitting: Licensed Clinical Social Worker

## 2021-05-16 NOTE — Telephone Encounter (Signed)
CSW received call from pt requesting help reapplying for [employer] assistance with [facility].  Pt reports he did not get application in the mail so CSW mailed out application for pt to complete and turn back in to clinic alongside proof of income.    Pt reports he has about another month of medication so no concerns with running out at this time.  Will continue to follow and assist as needed  Jorge Ny, [facility] Desk#: [phone] Cell#: [phone]

## 2021-05-18 ENCOUNTER — Telehealth (HOSPITAL_COMMUNITY): Payer: Self-pay | Admitting: Pharmacy Technician

## 2021-05-18 NOTE — Telephone Encounter (Signed)
Advanced Heart Failure Patient Advocate Encounter   Patient was approved to receive Entresto from [employer]  Effective dates: 05/14/21 through 04/09/22  Called and spoke with the patient. Approval scanned to chart.   Charlann Boxer, CPhT

## 2021-07-08 ENCOUNTER — Telehealth (HOSPITAL_COMMUNITY): Payer: Self-pay | Admitting: Cardiology

## 2021-07-08 NOTE — Telephone Encounter (Signed)
Abnormal labs received from PCP ?Chol 162 ?Tri 267 ?Hdl 34 ?Ldl 75 ? ?Per Allena Katz  ?TG too high decrease fatty foods in diet AND NEEDS F/U WITH DR MCLEAN-confirm crestor taking daily ? ? ?Pt reports he had not been taking crestor, however he has since restarted fu scheduled ?

## 2021-07-11 ENCOUNTER — Other Ambulatory Visit (HOSPITAL_COMMUNITY): Payer: Self-pay | Admitting: Cardiology

## 2021-08-29 ENCOUNTER — Encounter (HOSPITAL_COMMUNITY): Payer: Medicare HMO | Admitting: Cardiology

## 2021-09-08 ENCOUNTER — Other Ambulatory Visit (HOSPITAL_COMMUNITY): Payer: Self-pay | Admitting: Cardiology

## 2021-09-19 ENCOUNTER — Other Ambulatory Visit (HOSPITAL_COMMUNITY): Payer: Self-pay | Admitting: Cardiology

## 2021-09-20 ENCOUNTER — Telehealth (HOSPITAL_COMMUNITY): Payer: Self-pay | Admitting: Licensed Clinical Social Worker

## 2021-09-20 NOTE — Telephone Encounter (Signed)
CSW received message from pt requesting help with getting entresto and farxiga.  States he got a notice that his grant expired and now doesn't know how to get either medications.  Completed chart review and informed pt that he has [employer] to help him with Entresto through 12/31 of this year - provided him with number to order shipment.  CSW also saw that [place] had been obtained to help with farxiga- this is what expired on 6/2.  CSW sent message to Patient Advocate to assess for other options for assistance.  Jorge Ny, LCSW Clinical Social Worker Advanced [facility] Clinic Desk#: [phone] Cell#: [phone]

## 2021-09-21 ENCOUNTER — Other Ambulatory Visit (HOSPITAL_COMMUNITY): Payer: Self-pay

## 2021-09-21 ENCOUNTER — Telehealth (HOSPITAL_COMMUNITY): Payer: Self-pay | Admitting: Pharmacy Technician

## 2021-09-22 NOTE — Telephone Encounter (Signed)
Advanced Heart Failure Patient Advocate Encounter  The patient was approved for a [employer] grant that will help cover the cost of Farxiga. Total amount awarded, $10,000. Eligibility, 08/23/21 - 08/23/22.  ID [id number]  BIN [id number]  PCN [id number]  Group [id number]  Emailed grant information for the patient to provide to the pharmacy.  Charlann Boxer, CPhT

## 2021-10-24 ENCOUNTER — Other Ambulatory Visit (HOSPITAL_COMMUNITY): Payer: Self-pay | Admitting: *Deleted

## 2021-10-24 MED ORDER — EPLERENONE 50 MG PO TABS: 50.0000 mg | ORAL_TABLET | Freq: Every day | ORAL | 3 refills | Status: DC

## 2021-11-25 ENCOUNTER — Encounter (HOSPITAL_COMMUNITY): Payer: Medicare HMO | Admitting: Cardiology

## 2021-11-28 ENCOUNTER — Other Ambulatory Visit (HOSPITAL_COMMUNITY): Payer: Self-pay | Admitting: *Deleted

## 2021-11-28 MED ORDER — EPLERENONE 50 MG PO TABS: 50.0000 mg | ORAL_TABLET | Freq: Every day | ORAL | 3 refills | Status: DC

## 2021-11-28 MED ORDER — ENTRESTO 49-51 MG PO TABS: 1.0000 | ORAL_TABLET | Freq: Two times a day (BID) | ORAL | 3 refills | Status: DC

## 2021-11-28 MED ORDER — CARVEDILOL 12.5 MG PO TABS: 12.5000 mg | ORAL_TABLET | Freq: Two times a day (BID) | ORAL | 3 refills | Status: DC

## 2021-12-05 ENCOUNTER — Other Ambulatory Visit (HOSPITAL_COMMUNITY): Payer: Self-pay

## 2021-12-05 MED ORDER — ENTRESTO 49-51 MG PO TABS: 1.0000 | ORAL_TABLET | Freq: Two times a day (BID) | ORAL | 3 refills | Status: DC

## 2022-01-10 ENCOUNTER — Ambulatory Visit (HOSPITAL_COMMUNITY)
Admission: RE | Admit: 2022-01-10 | Discharge: 2022-01-10 | Disposition: A | Discharge status: Home / Self Care | Payer: Medicare HMO | Source: Ambulatory Visit | Attending: Cardiology | Admitting: Cardiology

## 2022-01-10 ENCOUNTER — Inpatient Hospital Stay (HOSPITAL_COMMUNITY)
Admission: RE | Admit: 2022-01-10 | Discharge: 2022-01-10 | Disposition: A | Discharge status: Home / Self Care | Payer: Medicare HMO | Source: Ambulatory Visit | Attending: Cardiology | Admitting: Cardiology

## 2022-01-10 ENCOUNTER — Other Ambulatory Visit (HOSPITAL_COMMUNITY): Payer: Self-pay | Admitting: Cardiology

## 2022-01-10 VITALS — BP 140/90 | HR 72 | Wt 186.6 lb

## 2022-01-10 DIAGNOSIS — R531 Weakness: Secondary | ICD-10-CM | POA: Insufficient documentation

## 2022-01-10 DIAGNOSIS — I5022 Chronic systolic (congestive) heart failure: Secondary | ICD-10-CM

## 2022-01-10 DIAGNOSIS — N189 Chronic kidney disease, unspecified: Secondary | ICD-10-CM | POA: Diagnosis not present

## 2022-01-10 DIAGNOSIS — G4733 Obstructive sleep apnea (adult) (pediatric): Secondary | ICD-10-CM | POA: Diagnosis not present

## 2022-01-10 DIAGNOSIS — R55 Syncope and collapse: Secondary | ICD-10-CM

## 2022-01-10 DIAGNOSIS — I428 Other cardiomyopathies: Secondary | ICD-10-CM | POA: Diagnosis not present

## 2022-01-10 DIAGNOSIS — Z8249 Family history of ischemic heart disease and other diseases of the circulatory system: Secondary | ICD-10-CM | POA: Insufficient documentation

## 2022-01-10 DIAGNOSIS — I130 Hypertensive heart and chronic kidney disease with heart failure and stage 1 through stage 4 chronic kidney disease, or unspecified chronic kidney disease: Secondary | ICD-10-CM | POA: Insufficient documentation

## 2022-01-10 DIAGNOSIS — Z79899 Other long term (current) drug therapy: Secondary | ICD-10-CM | POA: Insufficient documentation

## 2022-01-10 LAB — BASIC METABOLIC PANEL
Anion gap: 8 (ref 5–15)
BUN: 14 mg/dL (ref 8–23)
CO2: 24 mmol/L (ref 22–32)
Calcium: 9.3 mg/dL (ref 8.9–10.3)
Chloride: 107 mmol/L (ref 98–111)
Creatinine, Ser: 1.21 mg/dL (ref 0.61–1.24)
GFR, Estimated: >60 mL/min (ref >60)
Glucose, Bld: 117 mg/dL — ABNORMAL HIGH (ref 70–99)
Potassium: 4 mmol/L (ref 3.5–5.1)
Sodium: 139 mmol/L (ref 135–145)

## 2022-01-10 LAB — BRAIN NATRIURETIC PEPTIDE: B Natriuretic Peptide: 8.8 pg/mL (ref 0.0–100.0)

## 2022-01-10 MED ORDER — ENTRESTO 97-103 MG PO TABS: 1.0000 | ORAL_TABLET | Freq: Two times a day (BID) | ORAL | 3 refills | Status: DC

## 2022-01-10 NOTE — Patient Instructions (Signed)
Increase Entresto to 97/103 Twice daily  Labs done today, your results will be available in MyChart, we will contact you for abnormal readings.  Repeat blood work in 10 days  Your provider has recommended that  you wear a Zio Patch for 14 days.  This monitor will record your heart rhythm for our review.  IF you have any symptoms while wearing the monitor please press the button.  If you have any issues with the patch or you notice a red or orange light on it please call the company at [phone].  Once you remove the patch please mail it back to the company as soon as possible so we can get the results.  Your physician has requested that you have an echocardiogram. Echocardiography is a painless test that uses sound waves to create images of your heart. It provides your doctor with information about the size and shape of your heart and how well your heart's chambers and valves are working. This procedure takes approximately one hour. There are no restrictions for this procedure.   Your physician recommends that you schedule a follow-up appointment in: 4 months (February 2024)  ** please call the office in November to arrange your follow up appointment **  If you have any questions or concerns before your next appointment please send [place] a message through [place] or call our office at [phone].    TO LEAVE A MESSAGE FOR THE NURSE SELECT OPTION 2, PLEASE LEAVE A MESSAGE INCLUDING: YOUR NAME DATE OF BIRTH CALL BACK NUMBER REASON FOR CALL**this is important as we prioritize the call backs  YOU WILL RECEIVE A CALL BACK THE SAME DAY AS LONG AS YOU CALL BEFORE 4:00 PM  At the [facility], you and your health needs are our priority. As part of our continuing mission to provide you with exceptional heart care, we have created designated Provider Care Teams. These Care Teams include your primary Cardiologist (physician) and Advanced Practice Providers (APPs- Physician Assistants  and Nurse Practitioners) who all work together to provide you with the care you need, when you need it.   You may see any of the following providers on your designated Care Team at your next follow up: Dr Glori Bickers Dr Loralie Champagne Dr. Roxana Hires, NP Lyda Jester, [place] [facility] [place], [place] Forestine Na, NP Audry Riles, PharmD   Please be sure to bring in all your medications bottles to every appointment.

## 2022-01-10 NOTE — Progress Notes (Signed)
Patient ID: [name] [name], male   DOB: [birth year], 71 y.o.   MRN: [id number] Pulmonary: Dr Elsworth Soho PCP: Rhea Bleacher, NP Cardiology: Dr. Aundra Dubin  71 y.o. with history of nonischemic cardiomyopathy presents for followup of CHF.  Echo in 12/20 showed EF 40-45%.  This is mildly lower than prior.  He had been drinking more.   Echo in 4/22 showed EF up to 50-55%, normal RV.  Patient had [employer] gene testing for cardiomyopathy given brother with NICM and ICD at young age.  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   He has cut back a lot on ETOH and now drinks very little.  Still going to the gym regularly, lifts weights.  No significant exertional dyspnea or chest pain.  He had a "spell" of lightheadedness last Thursday, could not walk, ?spinning sensation.  This passed after a few minutes.  He did not lose consciousness. Weight down 6 lbs.   ECG (personally reviewed): NSR, RBBB  Labs (12/09): SPEP negative, HIV negative, transferrin saturation 29%, BNP 24 Labs (9/10): direct LDL 54, HDL 13.8, triglycerides 1470, creatinine 0.8 Labs (05/11/09): BNP 27, WBCs 5.6 Labs (5/11): K 4.6, creatinine 1.2, TGs 238, LDL 108, HDL 34 Labs (8/11): K 4.3, creatinine 1.4, BNP 12.6 Labs (2/12): K 4.1, creatinine 1.7, LDL 102 Labs (8/12): K 4.7, creatinine 1.5 Labs (4/13): LDL 132, HDL 33.4, AST 41, ALT 63 Labs (7/13): K 4, creatinine 1.2, ALT 59, AST 36, HCV negative Labs (1/14): K 4.1, creatinine 1.5 Labs (04/24/14): Cholesterol 222, TGL 265, HDL 37, LDL 169 Labs (4/16): LDL 110, HDl 38 Labs (9/16): LDL 116, HDL 36, TGs 175 Labs (01/2015): K 3.8 Creatinine 1.2 => 1.08, HCT 37 Labs (3/17): creatinine 1.07, BNP 8.9 Labs (6/17): K 4.2, creatinine 1.0 Labs (9/18): TGs 215, LDL 106, HDL 40 Labs (12/18): K 4.4, creatinine 1.09 Labs (12/19): LDL 149 Labs (12/20): LDL 100 Labs (1/21): K 4.4, creatinine 1.15 Labs (6/21): K  4.3, creatinine 1.16 Labs (7/22): K 4, creatinine 1.09 Labs (3/23): K 3.5, creatinine 1.2, LDL 75, TGs 267  Allergies (verified):  No Known Drug Allergies  Past Medical History: 1. Nonischemic cardiomyopathy.  The patient had a left heart catheterization done in March 2004 with normal coronaries and then in November 2008, he had a Myoview done that showed an EF of 39% with no ischemia.  Echo in April 2008 showed an EF of 35-40%, moderate diffuse hypokinesis, mild left atrial enlargement, normal RV size and function and essentially normal valves.  The cause of his cardiomyopathy has not been discovered. He drinks but not extremely heavily.  He has never used cocaine and amphetamines.  HIV, SPEP, and ferritin/Fe studies were all negative in 12/09.  Echo (9/10): EF 40%, mild to moderate global HK, mild LAE.  Myoview (5/11): EF 42% with apical thinning but no evidence for ischemia or infarction.  Echo (3/12): EF 45-50%, no significant valvular abnormalities. Echo (1/16) with EF 35-40%, diffuse hypokinesis.  - Cardiac MRI (3/17) with EF 43%, mild diffuse hypokinesis, mid-wall LGE in the mid septum, possible prior viral myocarditis.  - Echo (10/18): EF 50-55%, normal RV size and systolic function.  - Echo (12/20): EF 40-45%, normal RV.  - Echo (4/22): EF 50-55%, normal RV.  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  2. Hypertriglyceridemia 3. Rotator cuff tendonitis with a history  of bilateral shoulder surgery. 4. Hypertension. 5. History of a back surgery. 6. Knee osteoarthritis, s/p arthroscopy 2010 7. Gastroesophageal reflux disease.  EGD (1/12) with erosive esophagitis and gastritis, biopsy positive for H pylori (being treated). 8. Colonoscopy (1/12) with mild diverticulosis and hemorrhoids.  9. Allergic rhinitis 10. Gynecomastia with spironolactone (ok on eplerenone).  11. Probable mild intermittent asthma 12.  Elevated LFTs: HCV negative, possibly due to ETOH 13. CKD 14. OSA: CPAP.  15. H/o herniated nucleus pulposus: s/p surgery 6/17.  Family History: No premature CAD.  Father with prostate cancer Mother with CVA Brother with nonischemic cardiomyopathy, has ICD Grandmother with cancer (unsure what)    Social History: The patient denies smoking.  Lives in [place].  He does not use any drugs. He has never used any illicit drugs other than marijuana, which he smoked occasionally in the distant past.  Moderate ETOH. He is on disability. He is divorced with two sons.     ROS:  All systems reviewed and negative except as per HPI.    Current Outpatient Medications  Medication Sig Dispense Refill   carvedilol (COREG) 12.5 MG tablet Take 1 tablet (12.5 mg total) by mouth 2 (two) times daily. 180 tablet 3   eplerenone (INSPRA) 50 MG tablet Take 1 tablet (50 mg total) by mouth daily. 30 tablet 3   escitalopram (LEXAPRO) 20 MG tablet Take 20 mg by mouth at bedtime.     furosemide (LASIX) 20 MG tablet Take 1 tablet (20 mg total) by mouth as needed for edema. 30 tablet 11   HYDROmorphone (DILAUDID) 2 MG tablet Take 2 mg by mouth every 6 (six) hours as needed.     hydrOXYzine (ATARAX/VISTARIL) 25 MG tablet Take 25 mg by mouth daily in the afternoon.     pantoprazole (PROTONIX) 40 MG tablet Take 1 tablet by mouth once daily 90 tablet 3   pregabalin (LYRICA) 100 MG capsule Take 1 capsule (100 mg total) by mouth 2 (two) times daily. 180 capsule 3   rosuvastatin (CRESTOR) 40 MG tablet Take 1 tablet by mouth once daily 90 tablet 3   sacubitril-valsartan (ENTRESTO) 97-103 MG Take 1 tablet by mouth 2 (two) times daily. 180 tablet 3   sildenafil (VIAGRA) 100 MG tablet Take 100 mg by mouth as needed for erectile dysfunction.      cetirizine (ZYRTEC) 10 MG tablet Take 10 mg by mouth daily as needed for allergies or rhinitis. (Patient not taking: Reported on 01/10/2022)     No current facility-administered  medications for this encounter.    BP (!) 140/90   Pulse 72   Wt 84.6 kg (186 lb 9.6 oz)   SpO2 93%   BMI 28.79 kg/m  General: NAD Neck: No JVD, no thyromegaly or thyroid nodule.  Lungs: Clear to auscultation bilaterally with normal respiratory effort. CV: Nondisplaced PMI.  Heart regular S1/S2, no S3/S4, no murmur.  No peripheral edema.  No carotid bruit.  Normal pedal pulses.  Abdomen: Soft, nontender, no hepatosplenomegaly, no distention.  Skin: Intact without lesions or rashes.  Neurologic: Alert and oriented x 3.  Psych: Normal affect. Extremities: No clubbing or cyanosis.  HEENT: Normal.   Assessment/Plan:  1. Chronic HF with mid range EF: Nonischemic cardiomyopathy, ECHO 04/2014 with EF down to 35-40%.  Cardiac MRI in 3/17, however, showed EF 43% with mid-wall pattern of LGE in the septum that may be suggestive of prior myocarditis.  Echo in 10/18 showed EF up to 50-55. HIV negative, SPEP negative, no  evidence for hemochromatosis. Would also consider familial cardiomyopathy as his brother had cardiomyopathy with SCD at a young age, says his mother's family has multiple members with CHF => [employer] gene testing showed titin gene mutation heterozygote, can be associated with autosomal dominant dilated cardiomyopathy.  Echo in 12/20 showed EF down to 40-45%.  Echo in 4/22 showed EF back up to 50-55% (he has cut back on ETOH).  NYHA class I, not volume overloaded.  - Continue to keep ETOH at a minimum.  - Continue eplerenone 50 mg daily, BMET today.  - Continue carvedilol 12.5 mg twice a day.  - Increase Entresto to 97/103 bid with BMET/BNP today and BMET in 10 days.   - He does not need Lasix.  - He stopped Wilder Glade due to a yeast infection, will stay off.  - He still needs to have an appt with Dr. Lattie Corns to go over the significance of the titin gene mutation.  - Echo at followup in 4 months.  2. Hyperlipidemia: He is on Crestor.  3. OSA: Unable to tolerate CPAP.  4. "Weak  spell:" Occurred last week, not sure of etiology.  - I will have him wear a Zio monitor x 2 wks.   Followup in 4 months with echo  Loralie Champagne 01/10/2022

## 2022-01-12 ENCOUNTER — Other Ambulatory Visit (HOSPITAL_COMMUNITY): Payer: Self-pay | Admitting: *Deleted

## 2022-01-17 ENCOUNTER — Other Ambulatory Visit (HOSPITAL_COMMUNITY): Payer: Self-pay | Admitting: *Deleted

## 2022-01-17 MED ORDER — ENTRESTO 97-103 MG PO TABS: 1.0000 | ORAL_TABLET | Freq: Two times a day (BID) | ORAL | 3 refills | Status: DC

## 2022-01-20 ENCOUNTER — Other Ambulatory Visit (HOSPITAL_COMMUNITY): Payer: Medicare HMO

## 2022-01-23 ENCOUNTER — Other Ambulatory Visit (HOSPITAL_COMMUNITY): Payer: Medicare HMO

## 2022-02-06 ENCOUNTER — Telehealth (HOSPITAL_COMMUNITY): Payer: Self-pay

## 2022-02-06 NOTE — Telephone Encounter (Signed)
Informed patient of results

## 2022-02-28 ENCOUNTER — Other Ambulatory Visit: Payer: Self-pay | Admitting: Gastroenterology

## 2022-02-28 NOTE — Telephone Encounter (Signed)
Good afternoon,   This is a patient of Dr Ardis Hughs.  I am sending you the refill request as you are DOD pm.  Please advise.

## 2022-02-28 NOTE — Telephone Encounter (Signed)
Ok to refill 

## 2022-02-28 NOTE — Telephone Encounter (Signed)
Rx for pantoprazole '40mg'$  once daily sent to pharmacy as requested.

## 2022-06-14 ENCOUNTER — Other Ambulatory Visit: Payer: Self-pay

## 2022-06-14 ENCOUNTER — Emergency Department (HOSPITAL_BASED_OUTPATIENT_CLINIC_OR_DEPARTMENT_OTHER): Payer: Medicare HMO

## 2022-06-14 ENCOUNTER — Emergency Department (HOSPITAL_BASED_OUTPATIENT_CLINIC_OR_DEPARTMENT_OTHER)
Admission: EM | Admit: 2022-06-14 | Discharge: 2022-06-14 | Disposition: A | Discharge status: Home / Self Care | Payer: Medicare HMO | Attending: Emergency Medicine | Admitting: Emergency Medicine

## 2022-06-14 ENCOUNTER — Encounter (HOSPITAL_BASED_OUTPATIENT_CLINIC_OR_DEPARTMENT_OTHER): Payer: Self-pay

## 2022-06-14 DIAGNOSIS — S20212A Contusion of left front wall of thorax, initial encounter: Secondary | ICD-10-CM | POA: Diagnosis not present

## 2022-06-14 DIAGNOSIS — R0789 Other chest pain: Secondary | ICD-10-CM

## 2022-06-14 DIAGNOSIS — R0781 Pleurodynia: Secondary | ICD-10-CM | POA: Diagnosis present

## 2022-06-14 DIAGNOSIS — W01198A Fall on same level from slipping, tripping and stumbling with subsequent striking against other object, initial encounter: Secondary | ICD-10-CM | POA: Diagnosis not present

## 2022-06-14 DIAGNOSIS — S0081XA Abrasion of other part of head, initial encounter: Secondary | ICD-10-CM | POA: Diagnosis not present

## 2022-06-14 NOTE — ED Provider Notes (Signed)
[place] EMERGENCY DEPART[facility] AT [facility] Provider Note   CSN: [id number] Arrival date & time: 06/14/22  1557     History  Chief Complaint  Patient presents with   Rib Injury    [name] [name] is a 72 y.o. male.  72 yo M with a chief complaint of left chest wall pain.  The patient said he fell a few days ago.  He had lost his balance and fell onto his left side against an end table.  No pain immediately after the event.  He went to bed and woke up the next morning with some pain on that side.  Worse with movement palpation and deep breathing.  Does have a bruise to the left chest wall.  He has had improvement of his symptoms over time.  He is worried that something else is wrong because it is not completely resolved over the course of a few days.  Decided to come here for evaluation.        Home Medications Prior to Admission medications   Medication Sig Start Date End Date Taking? Authorizing Provider  carvedilol (COREG) 12.5 MG tablet Take 1 tablet (12.5 mg total) by mouth 2 (two) times daily. 11/28/21   Larey Dresser, MD  cetirizine (ZYRTEC) 10 MG tablet Take 10 mg by mouth daily as needed for allergies or rhinitis. Patient not taking: Reported on 01/10/2022    [provider]  eplerenone (INSPRA) 50 MG tablet Take 1 tablet (50 mg total) by mouth daily. 11/28/21   Larey Dresser, MD  escitalopram (LEXAPRO) 20 MG tablet Take 20 mg by mouth at bedtime.    [provider]  furosemide (LASIX) 20 MG tablet Take 1 tablet (20 mg total) by mouth as needed for edema. 04/07/21 04/07/22  Rafael Bihari, FNP  HYDROmorphone (DILAUDID) 2 MG tablet Take 2 mg by mouth every 6 (six) hours as needed. 01/03/20   [provider]  hydrOXYzine (ATARAX/VISTARIL) 25 MG tablet Take 25 mg by mouth daily in the afternoon.    [provider]  pantoprazole (PROTONIX) 40 MG tablet Take 1 tablet (40 mg total) by mouth daily. 02/28/22   Pyrtle, Lajuan Lines,  MD  pregabalin (LYRICA) 100 MG capsule Take 1 capsule (100 mg total) by mouth 2 (two) times daily. 08/01/19   Jessy Oto, MD  rosuvastatin (CRESTOR) 40 MG tablet Take 1 tablet by mouth once daily 09/08/21   Larey Dresser, MD  sacubitril-valsartan (ENTRESTO) 97-103 MG Take 1 tablet by mouth 2 (two) times daily. 01/17/22   Larey Dresser, MD  sildenafil (VIAGRA) 100 MG tablet Take 100 mg by mouth as needed for erectile dysfunction.  09/15/19   [provider]      Allergies    Patient has no known allergies.    Review of Systems   Review of Systems  Physical Exam Updated Vital Signs BP 96/84 (BP Location: Right Arm)   Pulse 85   Temp 98.3 F (36.8 C) (Oral)   Resp 18   Ht 5' 7.5" (1.715 m)   Wt 86.2 kg   SpO2 98%   BMI 29.32 kg/m  Physical Exam Vitals and nursing note reviewed.  Constitutional:      Appearance: He is well-developed.  HENT:     Head: Normocephalic and atraumatic.  Eyes:     Pupils: Pupils are equal, round, and reactive to light.  Neck:     Vascular: No JVD.  Cardiovascular:  Rate and Rhythm: Normal rate and regular rhythm.     Heart sounds: No murmur heard.    No friction rub. No gallop.  Pulmonary:     Effort: No respiratory distress.     Breath sounds: No wheezing.  Abdominal:     General: There is no distension.     Tenderness: There is no abdominal tenderness. There is no guarding or rebound.  Musculoskeletal:        General: Tenderness present. Normal range of motion.     Cervical back: Normal range of motion and neck supple.     Comments: Patient has a bruise over the left anterior chest wall about the midclavicular line about ribs 4 through 6.  He has no obvious crepitus.  Clear lung sounds.  Skin:    Coloration: Skin is not pale.     Findings: No rash.  Neurological:     Mental Status: He is alert and oriented to person, place, and time.  Psychiatric:        Behavior: Behavior normal.     ED Results / Procedures /  Treatments   Labs (all labs ordered are listed, but only abnormal results are displayed) Labs Reviewed - No data to display  EKG EKG Interpretation  Date/Time:  Wednesday June 14 2022 16:09:57 EST Ventricular Rate:  82 PR Interval:  173 QRS Duration: 158 QT Interval:  418 QTC Calculation: 489 R Axis:   23 Text Interpretation: Sinus rhythm Right bundle branch block No significant change since last tracing Confirmed by Deno Etienne [phone]) on 06/14/2022 4:24:59 PM  Radiology DG Ribs Unilateral W/Chest Left  Result Date: 06/14/2022 CLINICAL DATA:  Trauma, fall, pain EXAM: LEFT RIBS AND CHEST - 3+ VIEW COMPARISON:  Chest radiographs done on 09/13/2015 FINDINGS: Cardiac size is within normal limits. Lung fields are clear of any infiltrates or pulmonary edema. There is no pleural effusion or pneumothorax. No displaced fractures are seen in left ribs. There is previous surgical intervention in proximal left humerus. IMPRESSION: No displaced fracture is seen in left ribs. No active cardiopulmonary disease. Electronically Signed   By: Elmer Picker M.D.   On: 06/14/2022 16:34    Procedures Procedures    Medications Ordered in ED Medications - No data to display  ED Course/ Medical Decision Making/ A&P                             Medical Decision Making Amount and/or Complexity of Data Reviewed Radiology: ordered.   72 yo M with a chief complaints of a fall.  Nonsyncopal by history.  Occurred about 4 or 5 days ago.  Complaining of left-sided chest pain that has improved but has not resolved and so he came here for evaluation.  On my exam the patient does have some mild bruising to the left chest wall.  Chest x-ray was independently interpreted by me without focal infiltrate or pneumothorax.  I discussed results with the patient.  Will treat supportively.  PCP follow-up.  Patient also tells me that he thinks maybe he struck his head.  He has a very small abrasion to the vertex.  He has  had 4 days post without any significant events no confusion no vomiting I feel imaging of the head would be unlikely to be helpful in this scenario.  Will discharge him home.  PCP follow-up.  4:46 PM:  I have discussed the diagnosis/risks/treatment options with the patient.  Evaluation and  diagnostic testing in the emergency department does not suggest an emergent condition requiring admission or immediate intervention beyond what has been performed at this time.  They will follow up with PCP. We also discussed returning to the ED immediately if new or worsening sx occur. We discussed the sx which are most concerning (e.g., sudden worsening pain, fever, inability to tolerate by mouth) that necessitate immediate return. Medications administered to the patient during their visit and any new prescriptions provided to the patient are listed below.  Medications given during this visit Medications - No data to display   The patient appears reasonably screen and/or stabilized for discharge and I doubt any other medical condition or other [facility] requiring further screening, evaluation, or treatment in the ED at this time prior to discharge.          Final Clinical Impression(s) / ED Diagnoses Final diagnoses:  Left-sided chest wall pain    Rx / DC Orders ED Discharge Orders     None         Deno Etienne, DO 06/14/22 1646

## 2022-06-14 NOTE — ED Triage Notes (Signed)
States tripped over a rug on Friday night and fell into end table, hit head, unsure of LOC, also hit left ribs. C/o continued left rib pain, pain worse when deep breathing/coughing. Denies blood thinners.

## 2022-06-14 NOTE — Discharge Instructions (Addendum)
Your x-ray did not show an obvious broken bone or collapsed lung.  Typically this is treated by making sure you take big deep breaths at home.  I would use the incentive spirometer 10 minutes out of every hour you are awake at least for the next couple days.  Please return for shortness of breath or if you develop a fever.  Please follow-up with your family doctor in the office.

## 2022-06-26 ENCOUNTER — Other Ambulatory Visit (HOSPITAL_COMMUNITY): Payer: Self-pay

## 2022-06-26 MED ORDER — HYDROMORPHONE HCL 2 MG PO TABS
2.0000 mg | ORAL_TABLET | ORAL | 0 refills | Status: AC | PRN
  Filled 2022-06-26 – 2022-06-30 (×3): qty 180, 30d supply, fill #0

## 2022-06-30 ENCOUNTER — Other Ambulatory Visit (HOSPITAL_COMMUNITY): Payer: Self-pay

## 2022-06-30 ENCOUNTER — Other Ambulatory Visit: Payer: Self-pay

## 2022-06-30 ENCOUNTER — Other Ambulatory Visit (HOSPITAL_COMMUNITY): Payer: Self-pay | Admitting: Cardiology

## 2022-07-01 ENCOUNTER — Other Ambulatory Visit (HOSPITAL_COMMUNITY): Payer: Self-pay

## 2022-08-22 ENCOUNTER — Other Ambulatory Visit: Payer: Self-pay | Admitting: Internal Medicine

## 2022-08-25 ENCOUNTER — Other Ambulatory Visit (HOSPITAL_COMMUNITY): Payer: Self-pay | Admitting: Cardiology

## 2022-10-10 ENCOUNTER — Telehealth (HOSPITAL_COMMUNITY): Payer: Self-pay | Admitting: Pharmacy Technician

## 2022-10-10 NOTE — Telephone Encounter (Signed)
Advanced Heart Failure Patient Advocate Encounter  The patient was approved for a [employer] grant that will help cover the cost of Entresto, Inspra. Total amount awarded, $10,000. Eligibility, 06/02/242 - 09/09/23.  ID [id number]  BIN [id number]  PCN [id number]  Group [id number]  Archer Asa, CPhT

## 2022-10-16 ENCOUNTER — Other Ambulatory Visit (HOSPITAL_COMMUNITY): Payer: Self-pay | Admitting: Cardiology

## 2022-12-06 ENCOUNTER — Other Ambulatory Visit (HOSPITAL_COMMUNITY): Payer: Self-pay | Admitting: Cardiology

## 2022-12-06 ENCOUNTER — Other Ambulatory Visit: Payer: Self-pay | Admitting: Internal Medicine

## 2022-12-13 ENCOUNTER — Encounter (HOSPITAL_COMMUNITY): Payer: Medicare HMO | Admitting: Cardiology

## 2022-12-17 ENCOUNTER — Other Ambulatory Visit: Payer: Self-pay | Admitting: Internal Medicine

## 2022-12-21 ENCOUNTER — Other Ambulatory Visit: Payer: Self-pay | Admitting: Internal Medicine

## 2022-12-22 ENCOUNTER — Telehealth: Payer: Self-pay | Admitting: Internal Medicine

## 2022-12-22 MED ORDER — PANTOPRAZOLE SODIUM 40 MG PO TBEC: 40.0000 mg | DELAYED_RELEASE_TABLET | Freq: Every day | ORAL | 0 refills | Status: DC

## 2022-12-22 NOTE — Telephone Encounter (Signed)
Prescription sent to patient's pharmacy until scheduled appt.

## 2022-12-22 NOTE — Telephone Encounter (Signed)
Inbound call from patient stating he needed a refill for Protonix. Patient was scheduled for appointment with an app on 11/27 at 10:00 and is requesting his refill be sent. Please advise.

## 2023-01-17 ENCOUNTER — Other Ambulatory Visit (HOSPITAL_COMMUNITY): Payer: Self-pay | Admitting: Cardiology

## 2023-02-01 ENCOUNTER — Ambulatory Visit (HOSPITAL_COMMUNITY)
Admission: RE | Admit: 2023-02-01 | Discharge: 2023-02-01 | Disposition: A | Discharge status: Home / Self Care | Payer: Medicare HMO | Source: Ambulatory Visit | Attending: Cardiology | Admitting: Cardiology

## 2023-02-01 ENCOUNTER — Encounter (HOSPITAL_COMMUNITY): Payer: Self-pay | Admitting: Cardiology

## 2023-02-01 VITALS — BP 108/60 | HR 72 | Wt 190.8 lb

## 2023-02-01 DIAGNOSIS — N189 Chronic kidney disease, unspecified: Secondary | ICD-10-CM | POA: Insufficient documentation

## 2023-02-01 DIAGNOSIS — I428 Other cardiomyopathies: Secondary | ICD-10-CM | POA: Insufficient documentation

## 2023-02-01 DIAGNOSIS — G4733 Obstructive sleep apnea (adult) (pediatric): Secondary | ICD-10-CM | POA: Insufficient documentation

## 2023-02-01 DIAGNOSIS — I129 Hypertensive chronic kidney disease with stage 1 through stage 4 chronic kidney disease, or unspecified chronic kidney disease: Secondary | ICD-10-CM | POA: Insufficient documentation

## 2023-02-01 DIAGNOSIS — Z8249 Family history of ischemic heart disease and other diseases of the circulatory system: Secondary | ICD-10-CM | POA: Insufficient documentation

## 2023-02-01 DIAGNOSIS — Z79899 Other long term (current) drug therapy: Secondary | ICD-10-CM | POA: Diagnosis not present

## 2023-02-01 DIAGNOSIS — I5022 Chronic systolic (congestive) heart failure: Secondary | ICD-10-CM | POA: Insufficient documentation

## 2023-02-01 LAB — BASIC METABOLIC PANEL
Anion gap: 6 (ref 5–15)
BUN: 12 mg/dL (ref 8–23)
CO2: 27 mmol/L (ref 22–32)
Calcium: 9.1 mg/dL (ref 8.9–10.3)
Chloride: 106 mmol/L (ref 98–111)
Creatinine, Ser: 1.21 mg/dL (ref 0.61–1.24)
GFR, Estimated: >60 mL/min (ref >60)
Glucose, Bld: 88 mg/dL (ref 70–99)
Potassium: 4.2 mmol/L (ref 3.5–5.1)
Sodium: 139 mmol/L (ref 135–145)

## 2023-02-01 LAB — BRAIN NATRIURETIC PEPTIDE: B Natriuretic Peptide: 5.8 pg/mL (ref 0.0–100.0)

## 2023-02-01 LAB — CBC
HCT: 39.8 % (ref 39.0–52.0)
Hemoglobin: 13.6 g/dL (ref 13.0–17.0)
MCH: 30.8 pg (ref 26.0–34.0)
MCHC: 34.2 g/dL (ref 30.0–36.0)
MCV: 90 fL (ref 80.0–100.0)
Platelets: 149 10*3/uL — ABNORMAL LOW (ref 150–400)
RBC: 4.42 MIL/uL (ref 4.22–5.81)
RDW: 13.5 % (ref 11.5–15.5)
WBC: 6.3 10*3/uL (ref 4.0–10.5)
nRBC: 0 % (ref 0.0–0.2)

## 2023-02-01 LAB — LIPID PANEL
Cholesterol: 119 mg/dL (ref 0–200)
HDL: 30 mg/dL — ABNORMAL LOW (ref >40)
LDL Cholesterol: 52 mg/dL (ref 0–99)
Total CHOL/HDL Ratio: 4 {ratio}
Triglycerides: 185 mg/dL — ABNORMAL HIGH (ref <150)
VLDL: 37 mg/dL (ref 0–40)

## 2023-02-01 NOTE — Patient Instructions (Signed)
Take your protonix at night.  Labs done today, your results will be available in MyChart, we will contact you for abnormal readings.  Repeat blood work in 3 months.  Your physician has requested that you have an echocardiogram. Echocardiography is a painless test that uses sound waves to create images of your heart. It provides your doctor with information about the size and shape of your heart and how well your heart's chambers and valves are working. This procedure takes approximately one hour. There are no restrictions for this procedure. Please do NOT wear cologne, perfume, aftershave, or lotions (deodorant is allowed). Please arrive 15 minutes prior to your appointment time.  You have been referred to Dr. Jomarie Longs. Her office will call you to arrange your appointment.  Your physician recommends that you schedule a follow-up appointment in: 6 months.  If you have any questions or concerns before your next appointment please send [place] a message through [place] or call our office at [phone].    TO LEAVE A MESSAGE FOR THE NURSE SELECT OPTION 2, PLEASE LEAVE A MESSAGE INCLUDING: YOUR NAME DATE OF BIRTH CALL BACK NUMBER REASON FOR CALL**this is important as we prioritize the call backs  YOU WILL RECEIVE A CALL BACK THE SAME DAY AS LONG AS YOU CALL BEFORE 4:00 PM  At the [facility], you and your health needs are our priority. As part of our continuing mission to provide you with exceptional heart care, we have created designated Provider Care Teams. These Care Teams include your primary Cardiologist (physician) and Advanced Practice Providers (APPs- Physician Assistants and Nurse Practitioners) who all work together to provide you with the care you need, when you need it.   You may see any of the following providers on your designated Care Team at your next follow up: Dr Arvilla Meres Dr Marca Ancona Dr. Dorthula Nettles Dr. Clearnce Hasten Amy Filbert Schilder,  NP Robbie Lis, [place] [facility] [place], [place] Brynda Peon, NP [place] Lee, NP Karle Plumber, PharmD   Please be sure to bring in all your medications bottles to every appointment.    Thank you for choosing [facility]

## 2023-02-01 NOTE — Progress Notes (Signed)
Patient ID: [name] [name], male   DOB: October 15, 1950, 72 y.o.   MRN: [id number] Pulmonary: Dr Vassie Loll PCP: Erskine Emery, NP Cardiology: Dr. Shirlee Latch  72 y.o. with history of nonischemic cardiomyopathy presents for followup of CHF.  Echo in 12/20 showed EF 40-45%.  This is mildly lower than prior.  He had been drinking more.   Echo in 4/22 showed EF up to 50-55%, normal RV.  Patient had [employer] gene testing for cardiomyopathy given brother with NICM and ICD at young age.  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   He has cut back a lot on ETOH and now drinks very little.  He goes to the gym 3 days/week.  No exertional dyspnea or chest pain.  No lightheadedness, palpitations, or syncope.  Occasional coughing when he lies down to go to bed, has known GERD.  Weight stable.    ECG (personally reviewed): NSR, RBBB, inferior Qs  Labs (12/09): SPEP negative, HIV negative, transferrin saturation 29%, BNP 24 Labs (9/10): direct LDL 54, HDL 13.8, triglycerides 1470, creatinine 0.8 Labs (05/11/09): BNP 27, WBCs 5.6 Labs (5/11): K 4.6, creatinine 1.2, TGs 238, LDL 108, HDL 34 Labs (8/11): K 4.3, creatinine 1.4, BNP 12.6 Labs (2/12): K 4.1, creatinine 1.7, LDL 102 Labs (8/12): K 4.7, creatinine 1.5 Labs (4/13): LDL 132, HDL 33.4, AST 41, ALT 63 Labs (7/13): K 4, creatinine 1.2, ALT 59, AST 36, HCV negative Labs (1/14): K 4.1, creatinine 1.5 Labs (04/24/14): Cholesterol 222, TGL 265, HDL 37, LDL 169 Labs (4/16): LDL 110, HDl 38 Labs (9/16): LDL 116, HDL 36, TGs 175 Labs (01/2015): K 3.8 Creatinine 1.2 => 1.08, HCT 37 Labs (3/17): creatinine 1.07, BNP 8.9 Labs (6/17): K 4.2, creatinine 1.0 Labs (9/18): TGs 215, LDL 106, HDL 40 Labs (12/18): K 4.4, creatinine 1.09 Labs (12/19): LDL 149 Labs (12/20): LDL 409 Labs (1/21): K 4.4, creatinine 1.15 Labs (6/21): K 4.3, creatinine 1.16 Labs (7/22): K 4, creatinine  1.09 Labs (3/23): K 3.5, creatinine 1.2, LDL 75, TGs 267 Labs (10/23): BNP 8.8, K 4, creatinine 1.21  Allergies (verified):  No Known Drug Allergies  Past Medical History: 1. Nonischemic cardiomyopathy.  The patient had a left heart catheterization done in March 2004 with normal coronaries and then in November 2008, he had a Myoview done that showed an EF of 39% with no ischemia.  Echo in April 2008 showed an EF of 35-40%, moderate diffuse hypokinesis, mild left atrial enlargement, normal RV size and function and essentially normal valves.  The cause of his cardiomyopathy has not been discovered. He drinks but not extremely heavily.  He has never used cocaine and amphetamines.  HIV, SPEP, and ferritin/Fe studies were all negative in 12/09.  Echo (9/10): EF 40%, mild to moderate global HK, mild LAE.  Myoview (5/11): EF 42% with apical thinning but no evidence for ischemia or infarction.  Echo (3/12): EF 45-50%, no significant valvular abnormalities. Echo (1/16) with EF 35-40%, diffuse hypokinesis.  - Cardiac MRI (3/17) with EF 43%, mild diffuse hypokinesis, mid-wall LGE in the mid septum, possible prior viral myocarditis.  - Echo (10/18): EF 50-55%, normal RV size and systolic function.  - Echo (12/20): EF 40-45%, normal RV.  - Echo (4/22): EF 50-55%, normal RV.  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  2. Hypertriglyceridemia 3. Rotator cuff tendonitis with a  history of bilateral shoulder surgery. 4. Hypertension. 5. History of a back surgery. 6. Knee osteoarthritis, s/p arthroscopy 2010 7. Gastroesophageal reflux disease.  EGD (1/12) with erosive esophagitis and gastritis, biopsy positive for H pylori (being treated). 8. Colonoscopy (1/12) with mild diverticulosis and hemorrhoids.  9. Allergic rhinitis 10. Gynecomastia with spironolactone (ok on eplerenone).  11. Probable mild intermittent asthma 12.  Elevated LFTs: HCV negative, possibly due to ETOH 13. CKD 14. OSA: CPAP.  15. H/o herniated nucleus pulposus: s/p surgery 6/17. 16. Presyncope: Zio monitor in 10/23 with 2.1% PVCs, no VT.   Family History: No premature CAD.  Father with prostate cancer Mother with CVA Brother with nonischemic cardiomyopathy, has ICD Grandmother with cancer (unsure what)    Social History: The patient denies smoking.  Lives in [place].  He does not use any drugs. He has never used any illicit drugs other than marijuana, which he smoked occasionally in the distant past.  Moderate ETOH. He is on disability. He is divorced with two sons.     ROS:  All systems reviewed and negative except as per HPI.    Current Outpatient Medications  Medication Sig Dispense Refill   Biotin 88416 MCG TABS Take 1 tablet by mouth daily.     carvedilol (COREG) 12.5 MG tablet Take 1 tablet by mouth twice daily 180 tablet 0   cetirizine (ZYRTEC) 10 MG tablet Take 10 mg by mouth daily as needed for allergies or rhinitis.     ENTRESTO 97-103 MG TAKE 1 TABLET BY MOUTH TWICE A DAY 180 tablet 3   eplerenone (INSPRA) 50 MG tablet Take 1 tablet (50 mg total) by mouth daily. NEEDS FOLLOW UP APPOINTMENT FOR MORE REFILLS 90 tablet 0   escitalopram (LEXAPRO) 20 MG tablet Take 20 mg by mouth at bedtime.     furosemide (LASIX) 20 MG tablet Take 1 tablet (20 mg total) by mouth as needed for edema. 30 tablet 11   HYDROmorphone (DILAUDID) 2 MG tablet Take 1 tablet (2 mg total) by mouth every 4 (four) hours as needed for pain. Do not drink alcohol. 180 tablet 0   hydrOXYzine (ATARAX/VISTARIL) 25 MG tablet Take 25 mg by mouth daily in the afternoon.     pantoprazole (PROTONIX) 40 MG tablet Take 1 tablet (40 mg total) by mouth daily. 90 tablet 0   pregabalin (LYRICA) 100 MG capsule Take 1 capsule (100 mg total) by mouth 2 (two) times daily. 180 capsule 3   rosuvastatin (CRESTOR) 40 MG tablet Take 1 tablet by mouth once daily 90 tablet 0    sildenafil (VIAGRA) 100 MG tablet Take 100 mg by mouth as needed for erectile dysfunction.      No current facility-administered medications for this encounter.    BP 108/60   Pulse 72   Wt 86.5 kg (190 lb 12.8 oz)   SpO2 95%   BMI 29.44 kg/m  General: NAD Neck: No JVD, no thyromegaly or thyroid nodule.  Lungs: Clear to auscultation bilaterally with normal respiratory effort. CV: Nondisplaced PMI.  Heart regular S1/S2, no S3/S4, no murmur.  No peripheral edema.  No carotid bruit.  Normal pedal pulses.  Abdomen: Soft, nontender, no hepatosplenomegaly, no distention.  Skin: Intact without lesions or rashes.  Neurologic: Alert and oriented x 3.  Psych: Normal affect. Extremities: No clubbing or cyanosis.  HEENT: Normal.   Assessment/Plan:  1. Chronic HF with mid range EF: Nonischemic cardiomyopathy, ECHO 04/2014 with EF down to 35-40%.  Cardiac MRI in 3/17, however,  showed EF 43% with mid-wall pattern of LGE in the septum that may be suggestive of prior myocarditis.  Echo in 10/18 showed EF up to 50-55. HIV negative, SPEP negative, no evidence for hemochromatosis. Would also consider familial cardiomyopathy as his brother had cardiomyopathy with SCD at a young age, says his mother's family has multiple members with CHF => [employer] gene testing showed titin gene mutation heterozygote, can be associated with autosomal dominant dilated cardiomyopathy.  Echo in 12/20 showed EF down to 40-45%.  Echo in 4/22 showed EF back up to 50-55% (he has cut back on ETOH).  NYHA class I, not volume overloaded on exam.  - Continue to keep ETOH at a minimum.  - Continue eplerenone 50 mg daily, BMET today.  - Continue carvedilol 12.5 mg twice a day.  - Continue Entresto 97/103 bid.  - He does not need Lasix.  - He stopped Marcelline Deist due to a yeast infection, will stay off.  - He still needs to have an appt with Dr. Sidney Ace to go over the significance of the titin gene mutation, will refer again. He has 2  kids.  - I will arrange for echo.  2. Hyperlipidemia: He is on Crestor. Check lipids today.  3. OSA: Unable to tolerate CPAP.   Followup in 6 months with APP but should get BMET every 3 months with his medication regimen.   Marca Ancona 02/01/2023

## 2023-02-14 ENCOUNTER — Other Ambulatory Visit (INDEPENDENT_AMBULATORY_CARE_PROVIDER_SITE_OTHER): Payer: Self-pay

## 2023-02-14 ENCOUNTER — Encounter: Payer: Self-pay | Admitting: Physician Assistant

## 2023-02-14 ENCOUNTER — Ambulatory Visit (INDEPENDENT_AMBULATORY_CARE_PROVIDER_SITE_OTHER): Payer: Medicare HMO | Admitting: Physician Assistant

## 2023-02-14 DIAGNOSIS — M25562 Pain in left knee: Secondary | ICD-10-CM | POA: Diagnosis not present

## 2023-02-14 DIAGNOSIS — G8929 Other chronic pain: Secondary | ICD-10-CM

## 2023-02-14 NOTE — Progress Notes (Signed)
Office Visit Note   Patient: [name] [name]           Date of Birth: March 31, 1951           MRN: [id number] Visit Date: 02/14/2023              Requested by: Erskine Emery, NP [address] [place],  [place] [address] PCP: Erskine Emery, NP   Assessment & Plan: Visit Diagnoses:  1. Chronic pain of left knee     Plan: [name] [name] is a pleasant 72 year old gentleman who is 10 years status post partial left knee medial arthroplasty done at [facility].  He has been having ongoing problems last couple years with pain intermittently in the left knee.  Also has intermittent effusions.  He says he would like to have a total knee replacement if appropriate.  Medical history is significant for cardiomyopathy which is nonischemic he says this is managed well with cardiology.  I did review the risks of surgery with him he would have to get cardiology clearance and he is aware of this.  He has had surgery in the past without any difficulty.  Have arranged for him to have follow-up with Dr. Magnus Ivan discuss surgery further.  Would also like to discuss his right knee at some point as he is starting to have problems with that as well  Follow-Up Instructions: No follow-ups on file.   Orders:  No orders of the defined types were placed in this encounter.  No orders of the defined types were placed in this encounter.     Procedures: No procedures performed   Clinical Data: No additional findings.   Subjective: No chief complaint on file.   HPI Patient is a 72 year old gentleman who is 10 years status post medial partial knee arthroplasty at [facility].  He has began having ongoing problems last few years with his left knee.  He says it "always stays swollen ".  He has been on oral prednisone in the past for the knee pain.  He does have a history of cardiomyopathy with CHF but it is well-controlled he is followed by cardiologist.  It is nonischemic.  Rates his pain is moderate.  Denies any  fever chills.  He does try to stay active  Review of Systems  All other systems reviewed and are negative.   Objective: Vital Signs: There were no vitals taken for this visit.  Physical Exam Constitutional:      Appearance: Normal appearance.  Pulmonary:     Effort: Pulmonary effort is normal.  Skin:    General: Skin is warm and dry.  Neurological:     Mental Status: He is alert.  Psychiatric:        Mood and Affect: Mood normal.        Behavior: Behavior normal.   Ortho Exam Examination of his left knee he has a mild to moderate effusion well-healed surgical incision no erythema compartments are soft and compressible he is neurovascularly intact Specialty Comments:  No specialty comments available.  Imaging: No results found.   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    Class: Acute  Ganglion cyst of wrist 09/20/2015    Class: Chronic   OSA (obstructive sleep apnea) 07/22/2014   Hyperlipidemia 01/08/2013   Elevated LFTs 01/24/2012   Chronic systolic heart failure (HCC) 11/11/2010   Asthma 11/11/2010   ANAL AND RECTAL POLYP 01/14/2010   HEARTBURN 01/14/2010   RECTAL BLEEDING 11/24/2009   BREAST MASS, LEFT 11/24/2009   COUGH 05/11/2009   HYPERTRIGLYCERIDEMIA 12/29/2008   Chest pain 12/29/2008   Essential hypertension 03/11/2008   Nonischemic cardiomyopathy (HCC) 03/11/2008   Other and unspecified hyperlipidemia 12/25/2006   ALCOHOLISM 12/25/2006   ALLERGIC RHINITIS 12/25/2006   HYPERGLYCEMIA 12/25/2006   UROLITHIASIS, HX OF 12/25/2006   Past Medical History:  Diagnosis Date   Allergic rhinitis    Allergy     Anxiety    Asthma    Probable mild intermittent asthma   Cataract    CHF (congestive heart failure) (HCC)    CKD (chronic kidney disease)    Diverticulosis    a. Colonoscopy (1/12) with mild diverticulosis and hemorrhoids   Elevated LFTs    HCV negative, possibly due to ETOH   GERD (gastroesophageal reflux disease)    EGD 1/12 with erosive esophagitis and gastritis, biopsy postiive for H pylori (being  treated)   Gynecomastia    with spironolactone (ok on eplerenone)   History of back surgery    HTN (hypertension)    Hypertriglyceridemia    Knee osteoarthritis    s/p arhtroscopy 2010   Nonischemic cardiomyopathy (HCC)    LHC 06/2002 with normal cors; Myoview 11/08 neg for ischemia. Echo 4/08: EF of 35-40%.  Cause of NICM unknown.  Never a heavy drinker, used cocaine or amphetamines. HIV, SPEP, and ferritin/Fe studies were all negative in 12/09. Myoview (5/11): EF 42% with apical thinning but no evidence for ischemia or infarction. Echo (3/12): EF 45-50%, no significant valvular abnormalities.    Rotator cuff tendonitis    with hx of bilateral shoulder surgery   Sleep apnea    doesnot use cpap    Family History  Problem Relation Age of Onset   Stroke Mother    Prostate cancer Father    Pancreatitis Sister    Cardiomyopathy Brother        nonischemic, has icd   Cirrhosis Brother        alcoholic    Colon cancer Neg Hx    Esophageal cancer Neg Hx    Pancreatic cancer Neg Hx    Liver disease Neg Hx    Colon polyps Neg Hx    Rectal cancer Neg Hx    Stomach cancer Neg Hx     Past Surgical History:  Procedure Laterality Date   ELECTROCARDIOGRAM  09/20/06   GANGLION CYST EXCISION Right 09/20/2015   Procedure: REMOVAL GANGLION OF WRIST;  Surgeon: Kerrin Champagne, MD;  Location: [facility] OR;  Service: Orthopedics;  Laterality: Right;   KNEE ARTHROSCOPY Left    x 2, then partial replacement   LUMBAR LAMINECTOMY Right 09/20/2015   Procedure: FAR LATERAL APPROACH TO EXCISE HERNIATED NUCLEUS  PULPOSUS RIGHT Lumbar 1- Lumbar 2 AND RIGHT Lumbar 2-Lumbar 3, EXCISION OF GANGLION CYST RIGHT VOLAR RADIAL WRIST;  Surgeon: Kerrin Champagne, MD;  Location: [facility] OR;  Service: Orthopedics;  Laterality: Right;   MEDIAL PARTIAL KNEE REPLACEMENT Left    PTCA     ROTATOR CUFF REPAIR     2R; 3 L   stress cardiolite  12/15/05   TRANSTHORACIC ECHOCARDIOGRAM  07/13/06   Social History   Occupational  History   Occupation: Disability  Tobacco Use   Smoking status: Never   Smokeless tobacco: Never   Tobacco comments:    denies   Vaping Use   Vaping status: Never Used  Substance and Sexual Activity   Alcohol use: Yes    Alcohol/week: 6.0 standard drinks of alcohol    Types: 6 Shots of liquor per week    Comment: weekends   Drug use: No    Comment: Smoked marijuana on occasion in distant past.    Sexual activity: Not Currently

## 2023-02-16 ENCOUNTER — Ambulatory Visit (HOSPITAL_COMMUNITY)
Admission: RE | Admit: 2023-02-16 | Discharge: 2023-02-16 | Disposition: A | Discharge status: Home / Self Care | Payer: Medicare HMO | Source: Ambulatory Visit | Attending: Cardiology | Admitting: Cardiology

## 2023-02-16 DIAGNOSIS — I429 Cardiomyopathy, unspecified: Secondary | ICD-10-CM | POA: Insufficient documentation

## 2023-02-16 DIAGNOSIS — E785 Hyperlipidemia, unspecified: Secondary | ICD-10-CM | POA: Insufficient documentation

## 2023-02-16 DIAGNOSIS — I340 Nonrheumatic mitral (valve) insufficiency: Secondary | ICD-10-CM | POA: Diagnosis not present

## 2023-02-16 DIAGNOSIS — I5040 Unspecified combined systolic (congestive) and diastolic (congestive) heart failure: Secondary | ICD-10-CM | POA: Insufficient documentation

## 2023-02-16 DIAGNOSIS — G4730 Sleep apnea, unspecified: Secondary | ICD-10-CM | POA: Insufficient documentation

## 2023-02-16 DIAGNOSIS — I110 Hypertensive heart disease with heart failure: Secondary | ICD-10-CM | POA: Insufficient documentation

## 2023-02-16 DIAGNOSIS — I5022 Chronic systolic (congestive) heart failure: Secondary | ICD-10-CM | POA: Diagnosis present

## 2023-02-16 LAB — ECHOCARDIOGRAM COMPLETE
AR max vel: 2.27 cm2
AV Area VTI: 2.27 cm2
AV Area mean vel: 1.96 cm2
AV Mean grad: 4 mm[Hg]
AV Peak grad: 6.6 mm[Hg]
Ao pk vel: 1.28 m/s
Area-P 1/2: 4.21 cm2
Calc EF: 49.5 %
S' Lateral: 4.7 cm
Single Plane A2C EF: 50.2 %
Single Plane A4C EF: 47 %

## 2023-03-01 ENCOUNTER — Other Ambulatory Visit (HOSPITAL_COMMUNITY): Payer: Self-pay | Admitting: Cardiology

## 2023-03-06 NOTE — Progress Notes (Unsigned)
03/07/2023 DEMBA HULLENDER [id number] [id number]  Referring provider: Erskine Emery, NP Primary GI doctor:Dr. Myrtie Neither (Dr. Christella Hartigan)  ASSESSMENT AND PLAN:  Gastroesophageal reflux disease Denies melena, dysphagia 2022 EGD showed mild nonspecific gastritis, small hiatal hernia, pathology negative for H. pylori Continue on Protonix 40 mg 30 minutes in the morning can add on Pepcid as needed at night, continue elevation head of the bed, no eating 2 to 3 hours before lying down, continue to work on diet modifications.  Nonischemic cardiomyopathy (HCC) 11/2022 ejection fraction 50 to 55%, grade 1 diastolic dysfunction no aortic stenosis Has had improvement with decreasing alcohol, medications  Thrombocytopenia (HCC) with history of alcohol dependence No recent liver function no history of abdominal ultrasound Will get hepatic function today, discussed reasoning for getting abdominal ultrasound with the patient but he declines.  Consider in the future or if LFTs are abnormal. Could be medication related  History of adenomatous polyps 2022 Colonoscopy showed adequate prep 5 mm polyp otherwise unremarkable, adenomatous recall 7 years  Follow-up 1 year  Patient Care Team: Erskine Emery, NP as PCP - General  HISTORY OF PRESENT ILLNESS: 72 y.o. male with a past medical history of hemorrhoids, diverticulosis, H. pylori gastritis, erosive esophagitis, hiatal hernia, nonischemic cardiomyopathy echocardiogram  11/2022 ejection fraction 50 to 55%, grade 1 diastolic dysfunction no aortic stenosis and others listed below presents for evaluation of medication refill for Protonix.   04/2010 Dr. Christella Hartigan done for minor rectal bleeding; small hemorrhoids, left sided diverticulosis, no polyps.  Recall colon cancer screening recommended at 10 years  12/29/2020 EGD and colonoscopy Colonoscopy showed adequate prep 5 mm polyp otherwise unremarkable, adenomatous recall 7 years Endoscopy showed mild  nonspecific gastritis, small hiatal hernia, pathology negative for H. pylori Patient's had thrombocytopenia since 2017 most recent platelets 149 as low as 129 6 years ago No recent abdominal imaging did have CT renal stone study for flank pain in 2016 that was unremarkable  He is on protonix in the morning 30 mins before breakfast in the morning. He ran out for a week and had worsening GERD symptoms.  He states when he lays down at night he coughs occ, feels fluid into his throat and has some trouble swallowing spit at night occ. Denies dysphagia to solids or pills.  He has hoarseness last several days after mowing.   Denies any associated AB pain, nausea, vomiting, melena..  He  denies AB bloating.  No unintentional weight loss, no night sweats. He has burning sensation at night with lying down in his feet.   He denies NSAID use, takes tylenol. He reports ETOH use but rare, maybe once a week or once a month.   He  reports that he has never smoked. He has never used smokeless tobacco. He reports current alcohol use of about 6.0 standard drinks of alcohol per week. He reports that he does not use drugs.  RELEVANT LABS AND IMAGING:   CBC    Component Value Date/Time   WBC 6.3 02/01/2023 1209   RBC 4.42 02/01/2023 1209   HGB 13.6 02/01/2023 1209   HCT 39.8 02/01/2023 1209   PLT 149 (L) 02/01/2023 1209   MCV 90.0 02/01/2023 1209   MCH 30.8 02/01/2023 1209   MCHC 34.2 02/01/2023 1209   RDW 13.5 02/01/2023 1209   LYMPHSABS 1.8 09/13/2015 1357   MONOABS 0.4 09/13/2015 1357   EOSABS 0.2 09/13/2015 1357   BASOSABS 0.0 09/13/2015 1357   Recent Labs  02/01/23 1209  HGB 13.6    CMP     Component Value Date/Time   NA 139 02/01/2023 1209   K 4.2 02/01/2023 1209   CL 106 02/01/2023 1209   CO2 27 02/01/2023 1209   GLUCOSE 88 02/01/2023 1209   BUN 12 02/01/2023 1209   CREATININE 1.21 02/01/2023 1209   CALCIUM 9.1 02/01/2023 1209   PROT 6.6 06/11/2019 1208   ALBUMIN 4.0  06/11/2019 1208   AST 37 06/11/2019 1208   ALT 32 06/11/2019 1208   ALKPHOS 48 06/11/2019 1208   BILITOT 0.9 06/11/2019 1208   GFRNONAA >60 02/01/2023 1209   GFRAA >60 09/25/2019 1255      Latest Ref Rng & Units 06/11/2019   12:08 PM 03/29/2018   10:33 AM 10/22/2017   10:39 AM  Hepatic Function  Total Protein 6.5 - 8.1 g/dL 6.6  7.2  6.8   Albumin 3.5 - 5.0 g/dL 4.0  3.9  3.8   AST 15 - 41 U/L 37  24  21   ALT 0 - 44 U/L 32  31  27   Alk Phosphatase 38 - 126 U/L 48  54  57   Total Bilirubin 0.3 - 1.2 mg/dL 0.9  1.1  0.7   Bilirubin, Direct 0.0 - 0.2 mg/dL  0.2  0.2       Current Medications:    Current Outpatient Medications (Cardiovascular):    carvedilol (COREG) 12.5 MG tablet, Take 1 tablet by mouth twice daily   ENTRESTO 97-103 MG, TAKE 1 TABLET BY MOUTH TWICE A DAY   eplerenone (INSPRA) 50 MG tablet, Take 1 tablet (50 mg total) by mouth daily. NEEDS FOLLOW UP APPOINTMENT FOR MORE REFILLS   furosemide (LASIX) 20 MG tablet, Take 1 tablet (20 mg total) by mouth as needed for edema.   rosuvastatin (CRESTOR) 40 MG tablet, Take 1 tablet by mouth once daily   sildenafil (VIAGRA) 100 MG tablet, Take 100 mg by mouth as needed for erectile dysfunction.   Current Outpatient Medications (Respiratory):    cetirizine (ZYRTEC) 10 MG tablet, Take 10 mg by mouth daily as needed for allergies or rhinitis.  Current Outpatient Medications (Analgesics):    HYDROmorphone (DILAUDID) 2 MG tablet, Take 1 tablet (2 mg total) by mouth every 4 (four) hours as needed for pain. Do not drink alcohol.   Current Outpatient Medications (Other):    Biotin 01027 MCG TABS, Take 1 tablet by mouth daily.   escitalopram (LEXAPRO) 20 MG tablet, Take 20 mg by mouth at bedtime.   hydrOXYzine (ATARAX/VISTARIL) 25 MG tablet, Take 25 mg by mouth daily in the afternoon.   pregabalin (LYRICA) 100 MG capsule, Take 1 capsule (100 mg total) by mouth 2 (two) times daily.   pantoprazole (PROTONIX) 40 MG tablet, Take 1  tablet (40 mg total) by mouth daily.  Medical History:  Past Medical History:  Diagnosis Date   Allergic rhinitis    Allergy    Anxiety    Asthma    Probable mild intermittent asthma   Cataract    CHF (congestive heart failure) (HCC)    CKD (chronic kidney disease)    Diverticulosis    a. Colonoscopy (1/12) with mild diverticulosis and hemorrhoids   Elevated LFTs    HCV negative, possibly due to ETOH   GERD (gastroesophageal reflux disease)    EGD 1/12 with erosive esophagitis and gastritis, biopsy postiive for H pylori (being  treated)   Gynecomastia    with spironolactone (ok on  eplerenone)   History of back surgery    HTN (hypertension)    Hypertriglyceridemia    Knee osteoarthritis    s/p arhtroscopy 2010   Nonischemic cardiomyopathy (HCC)    LHC 06/2002 with normal cors; Myoview 11/08 neg for ischemia. Echo 4/08: EF of 35-40%.  Cause of NICM unknown.  Never a heavy drinker, used cocaine or amphetamines. HIV, SPEP, and ferritin/Fe studies were all negative in 12/09. Myoview (5/11): EF 42% with apical thinning but no evidence for ischemia or infarction. Echo (3/12): EF 45-50%, no significant valvular abnormalities.    Rotator cuff tendonitis    with hx of bilateral shoulder surgery   Sleep apnea    doesnot use cpap   Allergies: No Known Allergies   Surgical History:  He  has a past surgical history that includes Mitral valve replacement; stress cardiolite (12/15/05); transthoracic echocardiogram (07/13/06); electrocardiogram (09/20/06); Rotator cuff repair; Lumbar laminectomy (Right, 09/20/2015); Ganglion cyst excision (Right, 09/20/2015); Medial partial knee replacement (Left); and Knee arthroscopy (Left). Family History:  His family history includes Cardiomyopathy in his brother; Cirrhosis in his brother; Pancreatitis in his sister; Prostate cancer in his father; Stroke in his mother.  REVIEW OF SYSTEMS  : All other systems reviewed and negative except where noted in the History  of Present Illness.  PHYSICAL EXAM: BP 110/62 (BP Location: Left Arm, Patient Position: Sitting, Cuff Size: Normal)   Pulse 64   Ht 5' 7.5" (1.715 m)   Wt 196 lb (88.9 kg)   SpO2 96%   BMI 30.24 kg/m  General Appearance: Well nourished, in no apparent distress. Head:   Normocephalic and atraumatic. Eyes:  sclerae anicteric,conjunctive pink  Respiratory: Respiratory effort normal, BS equal bilaterally without rales, rhonchi, wheezing. Cardio: RRR with no MRGs. Peripheral pulses intact.  Abdomen: Soft,  Non-distended ,active bowel sounds. No tenderness .Marland Kitchen No masses. Rectal: Not evaluated Musculoskeletal: Full ROM, Normal gait. Without edema. Skin:  Dry and intact without significant lesions or rashes Neuro: Alert and  oriented x4;  No focal deficits. Psych:  Cooperative. Normal mood and affect.    Doree Albee, PA-C 10:29 AM

## 2023-03-07 ENCOUNTER — Ambulatory Visit: Payer: Medicare HMO | Admitting: Physician Assistant

## 2023-03-07 ENCOUNTER — Encounter: Payer: Self-pay | Admitting: Physician Assistant

## 2023-03-07 ENCOUNTER — Ambulatory Visit: Payer: Medicare HMO | Admitting: Orthopaedic Surgery

## 2023-03-07 VITALS — BP 110/62 | HR 64 | Ht 67.5 in | Wt 196.0 lb

## 2023-03-07 DIAGNOSIS — I428 Other cardiomyopathies: Secondary | ICD-10-CM | POA: Diagnosis not present

## 2023-03-07 DIAGNOSIS — D696 Thrombocytopenia, unspecified: Secondary | ICD-10-CM | POA: Diagnosis not present

## 2023-03-07 DIAGNOSIS — Z860101 Personal history of adenomatous and serrated colon polyps: Secondary | ICD-10-CM

## 2023-03-07 DIAGNOSIS — K219 Gastro-esophageal reflux disease without esophagitis: Secondary | ICD-10-CM

## 2023-03-07 DIAGNOSIS — F1021 Alcohol dependence, in remission: Secondary | ICD-10-CM | POA: Diagnosis not present

## 2023-03-07 MED ORDER — PANTOPRAZOLE SODIUM 40 MG PO TBEC: 40.0000 mg | DELAYED_RELEASE_TABLET | Freq: Every day | ORAL | 3 refills | Status: DC

## 2023-03-07 NOTE — Patient Instructions (Addendum)
Your provider has requested that you go to the basement level for lab work before leaving today. Press "B" on the elevator. The lab is located at the first door on the left as you exit the elevator.   Please take your proton pump inhibitor medication, protonix   Please take this medication 30 minutes to 1 hour before meals- this makes it more effective.  Can add on pepcid 20 mg over the counter at night Avoid spicy and acidic foods Avoid fatty foods Limit your intake of coffee, tea, alcohol, and carbonated drinks Work to maintain a healthy weight Keep the head of the bed elevated at least 3 inches with blocks or a wedge pillow if you are having any nighttime symptoms Stay upright for 2 hours after eating Avoid meals and snacks three to four hours before bedtime  First do a trial off milk/lactose products if you use them.    FODMAP stands for fermentable oligo-, di-, mono-saccharides and polyols (1). These are the scientific terms used to classify groups of carbs that are difficult for our body to digest and that are notorious for triggering digestive symptoms like bloating, gas, loose stools and stomach pain.   You can try low FODMAP diet  - start with eliminating just one column at a time that you feel may be a trigger for you. - the table at the very bottom contains foods that are low in FODMAPs   Sometimes trying to eliminate the FODMAP's from your diet is difficult or tricky, if you are stuggling with trying to do the elimination diet you can try an enzyme.  There is a food enzymes that you sprinkle in or on your food that helps break down the FODMAP. You can read more about the enzyme by going to this site: https://fodzyme.com/

## 2023-03-14 ENCOUNTER — Other Ambulatory Visit (HOSPITAL_COMMUNITY): Payer: Self-pay | Admitting: Cardiology

## 2023-03-14 NOTE — Progress Notes (Signed)
____________________________________________________________  Attending physician addendum:  Thank you for sending this case to me. I have reviewed the entire note and agree with the plan. (I will pick it up as supervising physician for him going forward)   Reinforce diet and lifestyle antireflux measures as well.  Amada Jupiter, MD  ____________________________________________________________

## 2023-03-27 ENCOUNTER — Ambulatory Visit: Payer: Medicare HMO | Attending: Genetic Counselor | Admitting: Genetic Counselor

## 2023-04-09 ENCOUNTER — Encounter: Payer: Medicare HMO | Admitting: Orthopaedic Surgery

## 2023-04-18 ENCOUNTER — Other Ambulatory Visit (HOSPITAL_COMMUNITY): Payer: Self-pay

## 2023-04-18 MED ORDER — HYDROMORPHONE HCL 2 MG PO TABS
ORAL_TABLET | ORAL | 0 refills | Status: DC
  Filled 2023-04-18: qty 96, 16d supply, fill #0
  Filled 2023-04-18: qty 84, 14d supply, fill #0

## 2023-05-04 ENCOUNTER — Other Ambulatory Visit (HOSPITAL_COMMUNITY): Payer: Medicare HMO

## 2023-05-10 ENCOUNTER — Ambulatory Visit (INDEPENDENT_AMBULATORY_CARE_PROVIDER_SITE_OTHER): Payer: Medicare HMO | Admitting: Physician Assistant

## 2023-05-10 ENCOUNTER — Encounter: Payer: Self-pay | Admitting: Physician Assistant

## 2023-05-10 ENCOUNTER — Other Ambulatory Visit (INDEPENDENT_AMBULATORY_CARE_PROVIDER_SITE_OTHER): Payer: Medicare HMO

## 2023-05-10 DIAGNOSIS — M25511 Pain in right shoulder: Secondary | ICD-10-CM | POA: Diagnosis not present

## 2023-05-10 MED ORDER — LIDOCAINE HCL 1 % IJ SOLN
3.0000 mL | INTRAMUSCULAR | Status: AC | PRN
  Administered 2023-05-10: 3 mL

## 2023-05-10 MED ORDER — METHYLPREDNISOLONE ACETATE 40 MG/ML IJ SUSP
40.0000 mg | INTRAMUSCULAR | Status: AC | PRN
  Administered 2023-05-10: 40 mg via INTRA_ARTICULAR

## 2023-05-10 NOTE — Progress Notes (Signed)
Office Visit Note   Patient: [name] [name]           Date of Birth: [birth year]           MRN: [id number] Visit Date: 05/10/2023              Requested by: Erskine Emery, NP [address] [place],  [place] [address] PCP: Erskine Emery, NP   Assessment & Plan: Visit Diagnoses: Right shoulder pain  Plan: Pleasant 73 year old gentleman who have seen in the past for knee issues comes in today with right shoulder pain.  He does have a history of 2 surgeries on the shoulder.  He also had an MRI done about 4 5 years ago that demonstrated complete tendon rupture with 3.7 cm of retraction.  He still managed to stay fairly strong and do everyday activities.  Recently he has had an increase in pain.  Seems to be rotator cuff related so I went forward with an injection today.  He will call and let me know his thoughts if he has continued pain would talk to Dr. August Saucer about getting an MRI and having him follow-up with he.  He does have from x-rays today and previous MRI degenerative changes in the glenohumeral joint could try an injection there.  Ultimately surgically at this point I think his only option would be reverse total shoulder replacement  Follow-Up Instructions: No follow-ups on file.   Orders:  No orders of the defined types were placed in this encounter.  No orders of the defined types were placed in this encounter.     Procedures: Large Joint Inj: R subacromial bursa on 05/10/2023 3:08 PM Indications: diagnostic evaluation and pain Details: 25 G 1.5 in needle, posterior approach  Arthrogram: No  Medications: 3 mL lidocaine 1 %; 40 mg methylPREDNISolone acetate 40 MG/ML Outcome: tolerated well, no immediate complications Procedure, treatment alternatives, risks and benefits explained, specific risks discussed. Consent was given by the patient.     Clinical Data: No additional findings.   Subjective: No chief complaint on file.   HPI Patient is a pleasant 73 year old  gentleman with a chief complaint of right shoulder pain.  He does have a long history of this and has had 2 arthroscopic surgeries in the past.  A few years ago he had an MRI that demonstrated a complete tear with 3.7 cm of retraction.  He is right-hand dominant.  Review of Systems  All other systems reviewed and are negative.   Objective: Vital Signs: There were no vitals taken for this visit.  Physical Exam Constitutional:      Appearance: Normal appearance.  Skin:    General: Skin is warm and dry.  Neurological:     General: No focal deficit present.     Mental Status: He is alert and oriented to person, place, and time.  Psychiatric:        Mood and Affect: Mood normal.        Behavior: Behavior normal.   Ortho Exam Right shoulder he is neurovascularly intact he has forward elevation to about 130 degrees.  Negative drop arm test.  He does have impingement findings empty can test no real pain with external rotation of his arm strength is fair with resisted abduction external and internal rotation no radicular findings today Specialty Comments:  No specialty comments available.  Imaging: No results found.   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    Class: Acute   Ganglion cyst of wrist 09/20/2015    Class: Chronic   OSA (obstructive sleep apnea) 07/22/2014   Hyperlipidemia 01/08/2013   Elevated LFTs 01/24/2012   Chronic systolic heart failure (HCC) 11/11/2010   Asthma 11/11/2010   ANAL AND RECTAL POLYP 01/14/2010   HEARTBURN 01/14/2010   RECTAL BLEEDING 11/24/2009   BREAST MASS, LEFT 11/24/2009   COUGH 05/11/2009    HYPERTRIGLYCERIDEMIA 12/29/2008   Chest pain 12/29/2008   Essential hypertension 03/11/2008   Nonischemic cardiomyopathy (HCC) 03/11/2008   Other and unspecified hyperlipidemia 12/25/2006   ALCOHOLISM 12/25/2006   ALLERGIC RHINITIS 12/25/2006   HYPERGLYCEMIA 12/25/2006   UROLITHIASIS, HX OF 12/25/2006   Past Medical History:  Diagnosis Date   Allergic rhinitis    Allergy    Anxiety    Asthma    Probable mild intermittent asthma   Cataract    CHF (congestive heart failure) (HCC)    CKD (chronic kidney disease)    Diverticulosis    a. Colonoscopy (1/12) with mild diverticulosis and hemorrhoids   Elevated LFTs    HCV negative, possibly due to ETOH   GERD (gastroesophageal reflux disease)    EGD 1/12 with erosive esophagitis and gastritis, biopsy postiive for H pylori (being  treated)   Gynecomastia    with spironolactone (ok on eplerenone)   History of back surgery    HTN (hypertension)    Hypertriglyceridemia    Knee osteoarthritis    s/p arhtroscopy 2010   Nonischemic cardiomyopathy (HCC)    LHC 06/2002 with normal cors; Myoview 11/08 neg for ischemia. Echo 4/08: EF of 35-40%.  Cause of NICM unknown.  Never a heavy drinker, used cocaine or amphetamines. HIV, SPEP, and ferritin/Fe studies were all negative in 12/09. Myoview (5/11): EF 42% with apical thinning but no evidence for ischemia or infarction. Echo (3/12): EF 45-50%, no significant valvular abnormalities.    Rotator cuff tendonitis    with hx of bilateral shoulder surgery   Sleep apnea    doesnot use cpap    Family History  Problem Relation Age of Onset   Stroke Mother    Prostate cancer Father    Pancreatitis Sister    Cardiomyopathy Brother        nonischemic, has icd   Cirrhosis Brother        alcoholic    Colon cancer Neg Hx    Esophageal cancer Neg Hx    Pancreatic cancer Neg Hx    Liver disease Neg Hx    Colon polyps Neg Hx    Rectal cancer Neg Hx    Stomach cancer Neg Hx     Past Surgical History:   Procedure Laterality Date   ELECTROCARDIOGRAM  09/20/06   GANGLION CYST EXCISION Right 09/20/2015   Procedure: REMOVAL GANGLION OF WRIST;  Surgeon: Kerrin Champagne, MD;  Location: [facility] OR;  Service: Orthopedics;  Laterality: Right;   KNEE ARTHROSCOPY Left    x 2, then partial replacement   LUMBAR LAMINECTOMY Right 09/20/2015   Procedure: FAR LATERAL APPROACH TO EXCISE HERNIATED NUCLEUS PULPOSUS RIGHT Lumbar 1- Lumbar 2 AND RIGHT Lumbar 2-Lumbar 3, EXCISION OF GANGLION CYST RIGHT VOLAR RADIAL WRIST;  Surgeon: Guy Sandifer  Otelia Sergeant, MD;  Location: [facility] OR;  Service: Orthopedics;  Laterality: Right;   MEDIAL PARTIAL KNEE REPLACEMENT Left    PTCA     ROTATOR CUFF REPAIR     2R; 3 L   stress cardiolite  12/15/05   TRANSTHORACIC ECHOCARDIOGRAM  07/13/06   Social History   Occupational History   Occupation: Disability  Tobacco Use   Smoking status: Never   Smokeless tobacco: Never   Tobacco comments:    denies   Vaping Use   Vaping status: Never Used  Substance and Sexual Activity   Alcohol use: Yes    Alcohol/week: 6.0 standard drinks of alcohol    Types: 6 Shots of liquor per week    Comment: weekends   Drug use: No    Comment: Smoked marijuana on occasion in distant past.    Sexual activity: Not Currently

## 2023-05-18 ENCOUNTER — Other Ambulatory Visit (HOSPITAL_COMMUNITY): Payer: Self-pay

## 2023-05-21 ENCOUNTER — Other Ambulatory Visit (HOSPITAL_COMMUNITY): Payer: Self-pay

## 2023-05-21 MED ORDER — HYDROMORPHONE HCL 2 MG PO TABS: 2.0000 mg | ORAL_TABLET | ORAL | 0 refills | Status: DC

## 2023-05-21 MED ORDER — CYCLOBENZAPRINE HCL 10 MG PO TABS: 10.0000 mg | ORAL_TABLET | Freq: Three times a day (TID) | ORAL | 1 refills | Status: DC

## 2023-06-09 ENCOUNTER — Other Ambulatory Visit (HOSPITAL_COMMUNITY): Payer: Self-pay | Admitting: Cardiology

## 2023-06-19 ENCOUNTER — Other Ambulatory Visit (HOSPITAL_COMMUNITY): Payer: Self-pay

## 2023-07-09 ENCOUNTER — Other Ambulatory Visit (HOSPITAL_COMMUNITY): Payer: Self-pay | Admitting: Cardiology

## 2023-07-16 ENCOUNTER — Other Ambulatory Visit (HOSPITAL_COMMUNITY): Payer: Self-pay

## 2023-07-18 ENCOUNTER — Other Ambulatory Visit (HOSPITAL_COMMUNITY): Payer: Self-pay

## 2023-07-19 ENCOUNTER — Other Ambulatory Visit (HOSPITAL_COMMUNITY): Payer: Self-pay

## 2023-07-19 MED ORDER — HYDROMORPHONE HCL 2 MG PO TABS
2.0000 mg | ORAL_TABLET | ORAL | 0 refills | Status: DC | PRN
  Filled 2023-07-19: qty 180, 30d supply, fill #0

## 2023-07-31 ENCOUNTER — Telehealth (HOSPITAL_COMMUNITY): Payer: Self-pay | Admitting: Family Medicine

## 2023-07-31 NOTE — Telephone Encounter (Signed)
 Called to confirm/remind patient of their appointment at [facility] on 07/31/23 .   Appointment:   [x] Confirmed  [] Left mess   [] No answer/No voice mail  [] VM Full/unable to leave message  [] Phone not in service  Patient reminded to bring all medications and/or complete list.  Confirmed patient has transportation. Gave directions, instructed to utilize valet parking.

## 2023-08-01 ENCOUNTER — Telehealth (HOSPITAL_COMMUNITY): Payer: Self-pay

## 2023-08-01 NOTE — Telephone Encounter (Signed)
 Called to confirm/remind patient of their appointment at [facility] on 08/02/23.   Appointment:   [x] Confirmed  [] Left mess   [] No answer/No voice mail  [] VM Full/unable to leave message  [] Phone not in service  Patient reminded to bring all medications and/or complete list.  Confirmed patient has transportation. Gave directions, instructed to utilize valet parking.

## 2023-08-02 ENCOUNTER — Encounter (HOSPITAL_COMMUNITY): Payer: Medicare HMO

## 2023-08-15 ENCOUNTER — Other Ambulatory Visit (HOSPITAL_COMMUNITY): Payer: Self-pay

## 2023-08-20 ENCOUNTER — Encounter (HOSPITAL_COMMUNITY)

## 2023-08-23 ENCOUNTER — Telehealth (HOSPITAL_COMMUNITY): Payer: Self-pay | Admitting: *Deleted

## 2023-08-23 NOTE — Telephone Encounter (Signed)
 Called to confirm/remind patient of their appointment at [facility] on 08/24/23.       Appointment:              [] Confirmed             [x] Left mess              [] No answer/No voice mail             [] Phone not in service   Patient reminded to bring all medications and/or complete list.   Confirmed patient has transportation. Gave directions, instructed to utilize valet parking.

## 2023-08-24 ENCOUNTER — Encounter (HOSPITAL_COMMUNITY)

## 2023-08-24 NOTE — Progress Notes (Incomplete)
 Patient ID: [name] [name], male   DOB: 01-Jan-1951, 73 y.o.   MRN: [id number]    [facility] Pulmonary: Dr Villa Greaser PCP: [name] Sensing, NP Cardiology: Dr. Mitzie Anda  Reason for Visit: Heart Failure Follow-up HPI: [name] [name] is a 73 y.o. with history of nonischemic cardiomyopathy.  Echo in 12/20 showed EF 40-45%.  This is mildly lower than prior.  He had been drinking more.   Echo in 4/22 showed EF up to 50-55%, normal RV.  Patient had [employer] gene testing for cardiomyopathy given brother with NICM and ICD at young age.  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   02/01/23: He has cut back a lot on ETOH and now drinks very little.  He goes to the gym 3 days/week.  No exertional dyspnea or chest pain.  No lightheadedness, palpitations, or syncope.  Occasional coughing when he lies down to go to bed, has known GERD.  Weight stable.     He returns today for heart failure follow up. Overall feeling ***. NYHA ***. Reports {Symptoms; cardiac:12860::"dyspnea","fatigue"}. Denies {Symptoms; cardiac:12860::"chest pain","dyspnea","fatigue","near-syncope","orthopnea","palpitations","dizziness","abnormal bleeding"}. Able to perform ADLs. Appetite okay. Weight at home ***. BP at home***. Compliant with all medications.    ECG (personally reviewed): NSR, RBBB, inferior Qs  PMH: 1. Nonischemic cardiomyopathy.  The patient had a left heart catheterization done in March 2004 with normal coronaries and then in November 2008, he had a Myoview done that showed an EF of 39% with no ischemia.  Echo in April 2008 showed an EF of 35-40%, moderate diffuse hypokinesis, mild left atrial enlargement, normal RV size and function and essentially normal valves.  The cause of his cardiomyopathy has not been discovered. He drinks but not extremely heavily.  He has never used cocaine and amphetamines.  HIV, SPEP, and  ferritin/Fe studies were all negative in 12/09.  Echo (9/10): EF 40%, mild to moderate global HK, mild LAE.  Myoview (5/11): EF 42% with apical thinning but no evidence for ischemia or infarction.  Echo (3/12): EF 45-50%, no significant valvular abnormalities. Echo (1/16) with EF 35-40%, diffuse hypokinesis.  - Cardiac MRI (3/17) with EF 43%, mild diffuse hypokinesis, mid-wall LGE in the mid septum, possible prior viral myocarditis.  - Echo (10/18): EF 50-55%, normal RV size and systolic function.  - Echo (12/20): EF 40-45%, normal RV.  - Echo (4/22): EF 50-55%, normal RV.  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  2. Hypertriglyceridemia 3. Rotator cuff tendonitis with a history of bilateral shoulder surgery. 4. Hypertension. 5. History of a back surgery. 6. Knee osteoarthritis, s/p arthroscopy 2010 7. Gastroesophageal reflux disease.  EGD (1/12) with erosive esophagitis and gastritis, biopsy positive for H pylori (being treated). 8. Colonoscopy (1/12) with mild diverticulosis and hemorrhoids.  9. Allergic rhinitis 10. Gynecomastia with spironolactone  (ok on eplerenone ).  11. Probable mild intermittent asthma 12. Elevated LFTs: HCV negative, possibly due to ETOH 13. CKD 14. OSA: CPAP.  15. H/o herniated nucleus pulposus: s/p surgery 6/17. 16. Presyncope: Zio monitor in 10/23 with 2.1% PVCs, no VT.   FH: No premature CAD.  Father with prostate cancer Mother with CVA Brother with nonischemic cardiomyopathy, has ICD Grandmother with cancer (unsure what)    SH: The patient denies smoking.  Lives in [place].  He does not use any drugs. He has never used any illicit drugs other  than marijuana, which he smoked occasionally in the distant past.  Moderate ETOH. He is on disability. He is divorced with two sons.     ROS:  All systems reviewed and negative except as per HPI.    Current Outpatient  Medications  Medication Sig Dispense Refill   Biotin 16109 MCG TABS Take 1 tablet by mouth daily.     carvedilol  (COREG ) 12.5 MG tablet Take 1 tablet by mouth twice daily 180 tablet 1   cetirizine  (ZYRTEC ) 10 MG tablet Take 10 mg by mouth daily as needed for allergies or rhinitis.     cyclobenzaprine  (FLEXERIL ) 10 MG tablet Take 1 tablet (10 mg total) by mouth 3 times daily 45 tablet 1   ENTRESTO  97-103 MG TAKE 1 TABLET BY MOUTH TWICE A DAY 180 tablet 3   eplerenone  (INSPRA ) 50 MG tablet TAKE 1 TABLET BY MOUTH DAILY 90 tablet 0   escitalopram (LEXAPRO) 20 MG tablet Take 20 mg by mouth at bedtime.     furosemide  (LASIX ) 20 MG tablet Take 1 tablet (20 mg total) by mouth as needed for edema. 30 tablet 11   HYDROmorphone  (DILAUDID ) 2 MG tablet Take 1 tablet (2 mg total) by mouth every 4 (four) hours as needed for pain. Do not drink alcohol. 180 tablet 0   HYDROmorphone  (DILAUDID ) 2 MG tablet Take 1 tablet by mouth every 4 hours as needed for pain do not drink alcohol; use CPAP 180 tablet 0   HYDROmorphone  (DILAUDID ) 2 MG tablet Take 1 tablet (2 mg total) by mouth every 4 (four) hours as needed for pain. do not drink alcohol. use CPAP. 180 tablet 0   hydrOXYzine (ATARAX/VISTARIL) 25 MG tablet Take 25 mg by mouth daily in the afternoon.     pantoprazole  (PROTONIX ) 40 MG tablet Take 1 tablet (40 mg total) by mouth daily. 90 tablet 3   pregabalin  (LYRICA ) 100 MG capsule Take 1 capsule (100 mg total) by mouth 2 (two) times daily. 180 capsule 3   rosuvastatin  (CRESTOR ) 40 MG tablet Take 1 tablet by mouth once daily 90 tablet 3   sildenafil (VIAGRA) 100 MG tablet Take 100 mg by mouth as needed for erectile dysfunction.      No current facility-administered medications for this visit.   There were no vitals taken for this visit.  There were no vitals filed for this visit.  PHYSICAL EXAM: General: Well appearing. No distress on RA Cardiac: JVP ***. S1 and S2 present. No murmurs or rub. Resp: Lung  sounds clear and equal B/L Abdomen: Soft, non-tender, non-distended.  Extremities: Warm and dry.  *** edema.  Neuro: Alert and oriented x3. Affect pleasant. Moves all extremities without difficulty.  ASSESSMENT/PLAN: 1. Chronic HF with mid range EF: Nonischemic cardiomyopathy, Echo 1/16 with EF down to 35-40%.  Cardiac MRI in 3/17, however, showed EF 43% with mid-wall pattern of LGE in the septum that may be suggestive of prior myocarditis.  Echo in 10/18 showed EF up to 50-55. HIV negative, SPEP negative, no evidence for hemochromatosis. 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  Echo in 12/20 showed EF down to 40-45%.  Echo in 4/22 and 11/24 showed EF back up to 50-55% (he has cut back on ETOH).   - NYHA class I, not volume overloaded on exam. *** Lasix  20 mg PRN - Continue to keep ETOH at a minimum.  - Continue eplerenone  50 mg daily, BMET today.  - Continue carvedilol   12.5 mg bid - Continue Entresto  97/103 bid.  - Off Farxiga  due to a yeast infection, wills stay off - He still needs to have an appt with Dr. Verdis Glade to go over the significance of the titin gene mutation, will refer again. He has 2 kids. *** Did not show to appointment *** 2. Hyperlipidemia: On crestor . LDL at goal 10/24. 3. OSA: Unable to tolerate CPAP.   Followup in 6 months with APP but should get BMET every 3 months with his medication regimen.  Follow up in *** with ***  [name] Hacking, FNP 08/24/2023

## 2023-09-05 ENCOUNTER — Other Ambulatory Visit (HOSPITAL_COMMUNITY): Payer: Self-pay | Admitting: Cardiology

## 2023-09-14 ENCOUNTER — Other Ambulatory Visit (HOSPITAL_COMMUNITY): Payer: Self-pay | Admitting: Cardiology

## 2023-10-01 NOTE — Progress Notes (Signed)
 Patient ID: [name] [name], male   DOB: 12/09/1950, 73 y.o.   MRN: [id number] Pulmonary: Dr Jude PCP: Silvano Angeline FALCON, NP Cardiology: Dr. Rolan  73 y.o. with history of nonischemic cardiomyopathy presents for followup of CHF.  Echo in 12/20 showed EF 40-45%.  This is mildly lower than prior.  He had been drinking more.   Echo in 4/22 showed EF up to 50-55%, normal RV.  Patient had [employer] gene testing for cardiomyopathy given brother with NICM and ICD at young age.  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   He has cut back a lot on ETOH and now drinks very little.  He goes to the gym 3 days/week.  No exertional dyspnea or chest pain.  No lightheadedness, palpitations, or syncope.  Occasional coughing when he lies down to go to bed, has known GERD.  Weight stable.    ECG (personally reviewed): NSR, RBBB, inferior Qs  Labs (12/09): SPEP negative, HIV negative, transferrin saturation 29%, BNP 24 Labs (9/10): direct LDL 54, HDL 13.8, triglycerides 1470, creatinine 0.8 Labs (05/11/09): BNP 27, WBCs 5.6 Labs (5/11): K 4.6, creatinine 1.2, TGs 238, LDL 108, HDL 34 Labs (8/11): K 4.3, creatinine 1.4, BNP 12.6 Labs (2/12): K 4.1, creatinine 1.7, LDL 102 Labs (8/12): K 4.7, creatinine 1.5 Labs (4/13): LDL 132, HDL 33.4, AST 41, ALT 63 Labs (7/13): K 4, creatinine 1.2, ALT 59, AST 36, HCV negative Labs (1/14): K 4.1, creatinine 1.5 Labs (04/24/14): Cholesterol 222, TGL 265, HDL 37, LDL 169 Labs (4/16): LDL 110, HDl 38 Labs (9/16): LDL 116, HDL 36, TGs 175 Labs (01/2015): K 3.8 Creatinine 1.2 => 1.08, HCT 37 Labs (3/17): creatinine 1.07, BNP 8.9 Labs (6/17): K 4.2, creatinine 1.0 Labs (9/18): TGs 215, LDL 106, HDL 40 Labs (12/18): K 4.4, creatinine 1.09 Labs (12/19): LDL 149 Labs (12/20): LDL 899 Labs (1/21): K 4.4, creatinine 1.15 Labs (6/21): K 4.3, creatinine 1.16 Labs (7/22): K 4, creatinine  1.09 Labs (3/23): K 3.5, creatinine 1.2, LDL 75, TGs 267 Labs (10/23): BNP 8.8, K 4, creatinine 1.21  Allergies (verified):  No Known Drug Allergies  Past Medical History: 1. Nonischemic cardiomyopathy.  The patient had a left heart catheterization done in March 2004 with normal coronaries and then in November 2008, he had a Myoview done that showed an EF of 39% with no ischemia.  Echo in April 2008 showed an EF of 35-40%, moderate diffuse hypokinesis, mild left atrial enlargement, normal RV size and function and essentially normal valves.  The cause of his cardiomyopathy has not been discovered. He drinks but not extremely heavily.  He has never used cocaine and amphetamines.  HIV, SPEP, and ferritin/Fe studies were all negative in 12/09.  Echo (9/10): EF 40%, mild to moderate global HK, mild LAE.  Myoview (5/11): EF 42% with apical thinning but no evidence for ischemia or infarction.  Echo (3/12): EF 45-50%, no significant valvular abnormalities. Echo (1/16) with EF 35-40%, diffuse hypokinesis.  - Cardiac MRI (3/17) with EF 43%, mild diffuse hypokinesis, mid-wall LGE in the mid septum, possible prior viral myocarditis.  - Echo (10/18): EF 50-55%, normal RV size and systolic function.  - Echo (12/20): EF 40-45%, normal RV.  - Echo (4/22): EF 50-55%, normal RV.  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  2. Hypertriglyceridemia 3. Rotator cuff tendonitis with a  history of bilateral shoulder surgery. 4. Hypertension. 5. History of a back surgery. 6. Knee osteoarthritis, s/p arthroscopy 2010 7. Gastroesophageal reflux disease.  EGD (1/12) with erosive esophagitis and gastritis, biopsy positive for H pylori (being treated). 8. Colonoscopy (1/12) with mild diverticulosis and hemorrhoids.  9. Allergic rhinitis 10. Gynecomastia with spironolactone  (ok on eplerenone ).  11. Probable mild intermittent asthma 12.  Elevated LFTs: HCV negative, possibly due to ETOH 13. CKD 14. OSA: CPAP.  15. H/o herniated nucleus pulposus: s/p surgery 6/17. 16. Presyncope: Zio monitor in 10/23 with 2.1% PVCs, no VT.   Family History: No premature CAD.  Father with prostate cancer Mother with CVA Brother with nonischemic cardiomyopathy, has ICD Grandmother with cancer (unsure what)    Social History: The patient denies smoking.  Lives in [place].  He does not use any drugs. He has never used any illicit drugs other than marijuana, which he smoked occasionally in the distant past.  Moderate ETOH. He is on disability. He is divorced with two sons.     ROS:  All systems reviewed and negative except as per HPI.    Current Outpatient Medications  Medication Sig Dispense Refill   Biotin 89999 MCG TABS Take 1 tablet by mouth daily.     carvedilol  (COREG ) 12.5 MG tablet TAKE 1 TABLET BY MOUTH 2 TIMES A DAY 180 tablet 1   cetirizine  (ZYRTEC ) 10 MG tablet Take 10 mg by mouth daily as needed for allergies or rhinitis.     cyclobenzaprine  (FLEXERIL ) 10 MG tablet Take 1 tablet (10 mg total) by mouth 3 times daily 45 tablet 1   ENTRESTO  97-103 MG TAKE 1 TABLET BY MOUTH TWICE A DAY 180 tablet 3   eplerenone  (INSPRA ) 50 MG tablet TAKE 1 TABLET BY MOUTH DAILY 90 tablet 0   escitalopram (LEXAPRO) 20 MG tablet Take 20 mg by mouth at bedtime.     furosemide  (LASIX ) 20 MG tablet Take 1 tablet (20 mg total) by mouth as needed for edema. 30 tablet 11   HYDROmorphone  (DILAUDID ) 2 MG tablet Take 1 tablet (2 mg total) by mouth every 4 (four) hours as needed for pain. Do not drink alcohol. 180 tablet 0   HYDROmorphone  (DILAUDID ) 2 MG tablet Take 1 tablet by mouth every 4 hours as needed for pain do not drink alcohol; use CPAP 180 tablet 0   HYDROmorphone  (DILAUDID ) 2 MG tablet Take 1 tablet (2 mg total) by mouth every 4 (four) hours as needed for pain. do not drink alcohol. use CPAP. 180 tablet 0   hydrOXYzine (ATARAX/VISTARIL) 25 MG  tablet Take 25 mg by mouth daily in the afternoon.     pantoprazole  (PROTONIX ) 40 MG tablet Take 1 tablet (40 mg total) by mouth daily. 90 tablet 3   pregabalin  (LYRICA ) 100 MG capsule Take 1 capsule (100 mg total) by mouth 2 (two) times daily. 180 capsule 3   rosuvastatin  (CRESTOR ) 40 MG tablet Take 1 tablet by mouth once daily 90 tablet 3   sildenafil (VIAGRA) 100 MG tablet Take 100 mg by mouth as needed for erectile dysfunction.      No current facility-administered medications for this visit.    There were no vitals taken for this visit. General: NAD Neck: No JVD, no thyromegaly or thyroid  nodule.  Lungs: Clear to auscultation bilaterally with normal respiratory effort. CV: Nondisplaced PMI.  Heart regular S1/S2, no S3/S4, no murmur.  No peripheral edema.  No carotid bruit.  Normal pedal pulses.  Abdomen: Soft, nontender,  no hepatosplenomegaly, no distention.  Skin: Intact without lesions or rashes.  Neurologic: Alert and oriented x 3.  Psych: Normal affect. Extremities: No clubbing or cyanosis.  HEENT: Normal.   Assessment/Plan:  1. Chronic HF with mid range EF: Nonischemic cardiomyopathy, ECHO 04/2014 with EF down to 35-40%.  Cardiac MRI in 3/17, however, showed EF 43% with mid-wall pattern of LGE in the septum that may be suggestive of prior myocarditis.  Echo in 10/18 showed EF up to 50-55. HIV negative, SPEP negative, no evidence for hemochromatosis. Would also consider familial cardiomyopathy as his brother had cardiomyopathy with SCD at a young age, says his mother's family has multiple members with CHF => [employer] gene testing showed titin gene mutation heterozygote, can be associated with autosomal dominant dilated cardiomyopathy.  Echo in 12/20 showed EF down to 40-45%.  Echo in 4/22 showed EF back up to 50-55% (he has cut back on ETOH).  NYHA class I, not volume overloaded on exam.  - Continue to keep ETOH at a minimum.  - Continue eplerenone  50 mg daily, BMET today.  -  Continue carvedilol  12.5 mg twice a day.  - Continue Entresto  97/103 bid.  - He does not need Lasix .  - He stopped Farxiga  due to a yeast infection, will stay off.  - He still needs to have an appt with Dr. Danford Pac to go over the significance of the titin gene mutation, will refer again. He has 2 kids.  - I will arrange for echo.  2. Hyperlipidemia: He is on Crestor . Check lipids today.  3. OSA: Unable to tolerate CPAP.   Followup in 6 months with APP but should get BMET every 3 months with his medication regimen.   Harlene HERO [facility] 10/01/2023

## 2023-10-02 ENCOUNTER — Encounter (HOSPITAL_COMMUNITY): Payer: Self-pay

## 2023-10-02 ENCOUNTER — Ambulatory Visit (HOSPITAL_COMMUNITY)
Admission: RE | Admit: 2023-10-02 | Discharge: 2023-10-02 | Disposition: A | Discharge status: Home / Self Care | Source: Ambulatory Visit | Attending: Family Medicine | Admitting: Family Medicine

## 2023-10-02 ENCOUNTER — Ambulatory Visit (HOSPITAL_COMMUNITY): Payer: Self-pay | Admitting: Family Medicine

## 2023-10-02 VITALS — BP 108/68 | HR 65 | Wt 198.0 lb

## 2023-10-02 DIAGNOSIS — G4733 Obstructive sleep apnea (adult) (pediatric): Secondary | ICD-10-CM | POA: Diagnosis not present

## 2023-10-02 DIAGNOSIS — I129 Hypertensive chronic kidney disease with stage 1 through stage 4 chronic kidney disease, or unspecified chronic kidney disease: Secondary | ICD-10-CM | POA: Insufficient documentation

## 2023-10-02 DIAGNOSIS — Z8249 Family history of ischemic heart disease and other diseases of the circulatory system: Secondary | ICD-10-CM | POA: Diagnosis not present

## 2023-10-02 DIAGNOSIS — R079 Chest pain, unspecified: Secondary | ICD-10-CM | POA: Diagnosis not present

## 2023-10-02 DIAGNOSIS — N189 Chronic kidney disease, unspecified: Secondary | ICD-10-CM | POA: Insufficient documentation

## 2023-10-02 DIAGNOSIS — I5022 Chronic systolic (congestive) heart failure: Secondary | ICD-10-CM | POA: Insufficient documentation

## 2023-10-02 DIAGNOSIS — R0789 Other chest pain: Secondary | ICD-10-CM | POA: Insufficient documentation

## 2023-10-02 DIAGNOSIS — E785 Hyperlipidemia, unspecified: Secondary | ICD-10-CM | POA: Insufficient documentation

## 2023-10-02 DIAGNOSIS — Z79899 Other long term (current) drug therapy: Secondary | ICD-10-CM | POA: Diagnosis not present

## 2023-10-02 DIAGNOSIS — I428 Other cardiomyopathies: Secondary | ICD-10-CM | POA: Diagnosis not present

## 2023-10-02 LAB — BASIC METABOLIC PANEL WITH GFR
Anion gap: 8 (ref 5–15)
BUN: 10 mg/dL (ref 8–23)
CO2: 26 mmol/L (ref 22–32)
Calcium: 8.9 mg/dL (ref 8.9–10.3)
Chloride: 105 mmol/L (ref 98–111)
Creatinine, Ser: 1.32 mg/dL — ABNORMAL HIGH (ref 0.61–1.24)
GFR, Estimated: 58 mL/min — ABNORMAL LOW (ref >60)
Glucose, Bld: 121 mg/dL — ABNORMAL HIGH (ref 70–99)
Potassium: 3.6 mmol/L (ref 3.5–5.1)
Sodium: 139 mmol/L (ref 135–145)

## 2023-10-02 LAB — BRAIN NATRIURETIC PEPTIDE: B Natriuretic Peptide: 25.8 pg/mL (ref 0.0–100.0)

## 2023-10-02 NOTE — Patient Instructions (Addendum)
 Good to see you today!  Labs done today, your results will be available in MyChart, we will contact you for abnormal readings.  Your physician has requested that you have an echocardiogram. Echocardiography is a painless test that uses sound waves to create images of your heart. It provides your doctor with information about the size and shape of your heart and how well your heart's chambers and valves are working. This procedure takes approximately one hour. There are no restrictions for this procedure. Please do NOT wear cologne, perfume, aftershave, or lotions (deodorant is allowed). Please arrive 15 minutes prior to your appointment time.  Please note: We ask at that you not bring children with you during ultrasound (echo/ vascular) testing. Due to room size and safety concerns, children are not allowed in the ultrasound rooms during exams. Our front office staff cannot provide observation of children in our lobby area while testing is being conducted. An adult accompanying a patient to their appointment will only be allowed in the ultrasound room at the discretion of the ultrasound technician under special circumstances. We apologize for any inconvenience.  Your physician recommends that you schedule a follow-up appointment:6 months( December) with echocardiogram Call office in October to schedule an appointment  If you have any questions or concerns before your next appointment please send us  a message through [place] or call our office at [phone].    TO LEAVE A MESSAGE FOR THE NURSE SELECT OPTION 2, PLEASE LEAVE A MESSAGE INCLUDING: YOUR NAME DATE OF BIRTH CALL BACK NUMBER REASON FOR CALL**this is important as we prioritize the call backs  YOU WILL RECEIVE A CALL BACK THE SAME DAY AS LONG AS YOU CALL BEFORE 4:00 PM At the [facility], you and your health needs are our priority. As part of our continuing mission to provide you with exceptional heart care, we have  created designated Provider Care Teams. These Care Teams include your primary Cardiologist (physician) and Advanced Practice Providers (APPs- Physician Assistants and Nurse Practitioners) who all work together to provide you with the care you need, when you need it.   You may see any of the following providers on your designated Care Team at your next follow up: Dr Toribio Fuel Dr Ezra Shuck Dr. Ria Commander Dr. Morene Brownie Amy Lenetta, NP Caffie Shed, [place] [facility] [place], [place] Beckey Coe, NP [place] Lee, NP Ellouise Class, NP Tinnie Redman, PharmD Jaun Bash, PharmD   Please be sure to bring in all your medications bottles to every appointment.    Thank you for choosing [facility]

## 2023-11-20 ENCOUNTER — Telehealth (HOSPITAL_COMMUNITY): Payer: Self-pay | Admitting: Pharmacy Technician

## 2023-11-20 NOTE — Telephone Encounter (Signed)
 Advanced [facility] Patient Advocate Encounter  The patient was approved for a [employer] grant that will help cover the cost of Entresto . Total amount awarded, $4,500. Eligibility, 09/10/23 - 09/08/24.  ID [id number]  BIN [id number]  PCN [id number]  Group [id number]  [name] JULIANNA Pa, CPhT

## 2023-11-28 ENCOUNTER — Ambulatory Visit (HOSPITAL_BASED_OUTPATIENT_CLINIC_OR_DEPARTMENT_OTHER)

## 2023-11-28 ENCOUNTER — Encounter (HOSPITAL_BASED_OUTPATIENT_CLINIC_OR_DEPARTMENT_OTHER): Payer: Self-pay | Admitting: Physician Assistant

## 2023-11-28 ENCOUNTER — Ambulatory Visit (HOSPITAL_BASED_OUTPATIENT_CLINIC_OR_DEPARTMENT_OTHER): Admitting: Physician Assistant

## 2023-11-28 ENCOUNTER — Other Ambulatory Visit (HOSPITAL_BASED_OUTPATIENT_CLINIC_OR_DEPARTMENT_OTHER): Payer: Self-pay | Admitting: Physician Assistant

## 2023-11-28 DIAGNOSIS — M542 Cervicalgia: Secondary | ICD-10-CM

## 2023-11-28 MED ORDER — METHYLPREDNISOLONE 4 MG PO TBPK
ORAL_TABLET | ORAL | 0 refills | Status: DC
Start: 2023-11-28 — End: 2023-12-18

## 2023-11-28 NOTE — Progress Notes (Signed)
 Office Visit Note   Patient: [name] [name]           Date of Birth: 30-Jan-1951           MRN: [id number] Visit Date: 11/28/2023              Requested by: Silvano Angeline FALCON, NP [address] [place],  [place] [address] PCP: Silvano Angeline FALCON, NP   Assessment & Plan: Visit Diagnoses:  1. Neck pain     Plan: Stafford presents about 8 weeks status for post a fall off of his lawnmower where he landed on the base of his neck.  He got up right away did not have any loss of consciousness.  He does have a history of cervical spine issues he is also on chronic pain management.  No red flags on exam today he is intact neurologically.  He does have quite a bit of point tenderness still over the area at the base of his neck.  Cannot rule out a fracture by x-ray.  Recommend an MRI.  If it shows some impingement nerves he could return to his pain clinic for an injection this needs to be cleared to make sure he has no fracture.  Will also put him on a dose of steroid.  He has taken this in before he got understands not to take other anti-inflammatories with it  Follow-Up Instructions: No follow-ups on file.   Orders:  No orders of the defined types were placed in this encounter.  No orders of the defined types were placed in this encounter.     Procedures: No procedures performed   Clinical Data: No additional findings.   Subjective: Chief Complaint  Patient presents with   Neck - Pain    HPI patient is a pleasant 73 year old gentleman who comes today with a chief complaint of neck pain.  He had a fall over a month ago.  Can hardly turn head.  He has pain with lifting his arms he has tingling and numbness  Review of Systems  All other systems reviewed and are negative.    Objective: Vital Signs: There were no vitals taken for this visit.  Physical Exam Constitutional:      Appearance: Normal appearance.  Pulmonary:     Effort: Pulmonary effort is normal.  Skin:    General:  Skin is warm and dry.  Neurological:     General: No focal deficit present.     Mental Status: He is alert and oriented to person, place, and time.     Ortho Exam Examination of his neck he has some tenderness at the base of his neck there is no ecchymosis no step-offs.  He can rotate his head side-to-side but it is somewhat stiff stiff and painful.  Biceps tricep strength intact abductor strength intact grip strength intact sensation intact Specialty Comments:  No specialty comments available.  Imaging: No results found.   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    Class: Acute   Ganglion cyst of wrist 09/20/2015    Class: Chronic   OSA (obstructive sleep apnea) 07/22/2014  Hyperlipidemia 01/08/2013   Elevated LFTs 01/24/2012   Heart failure with mildly reduced ejection fraction (HFmrEF) (HCC) 11/11/2010   Asthma 11/11/2010   ANAL AND RECTAL POLYP 01/14/2010   HEARTBURN 01/14/2010   RECTAL BLEEDING 11/24/2009   BREAST MASS, LEFT 11/24/2009   COUGH 05/11/2009   HYPERTRIGLYCERIDEMIA 12/29/2008   Chest pain 12/29/2008   Essential hypertension 03/11/2008   Nonischemic cardiomyopathy (HCC) 03/11/2008   Other and unspecified hyperlipidemia 12/25/2006   ALCOHOLISM 12/25/2006   ALLERGIC RHINITIS 12/25/2006   HYPERGLYCEMIA 12/25/2006   UROLITHIASIS, HX OF 12/25/2006   Past Medical History:  Diagnosis Date   Allergic rhinitis    Allergy    Anxiety    Asthma    Probable mild intermittent asthma   Cataract    CHF (congestive heart failure) (HCC)    CKD (chronic kidney disease)    Diverticulosis     a. Colonoscopy (1/12) with mild diverticulosis and hemorrhoids   Elevated LFTs    HCV negative, possibly due to ETOH   GERD (gastroesophageal reflux disease)    EGD 1/12 with erosive esophagitis and gastritis, biopsy postiive for H pylori (being  treated)   Gynecomastia    with spironolactone  (ok on eplerenone )   History of back surgery    HTN (hypertension)    Hypertriglyceridemia    Knee osteoarthritis    s/p arhtroscopy 2010   Nonischemic cardiomyopathy (HCC)    LHC 06/2002 with normal cors; Myoview 11/08 neg for ischemia. Echo 4/08: EF of 35-40%.  Cause of NICM unknown.  Never a heavy drinker, used cocaine or amphetamines. HIV, SPEP, and ferritin/Fe studies were all negative in 12/09. Myoview (5/11): EF 42% with apical thinning but no evidence for ischemia or infarction. Echo (3/12): EF 45-50%, no significant valvular abnormalities.    Rotator cuff tendonitis    with hx of bilateral shoulder surgery   Sleep apnea    doesnot use cpap    Family History  Problem Relation Age of Onset   Stroke Mother    Prostate cancer Father    Pancreatitis Sister    Cardiomyopathy Brother        nonischemic, has icd   Cirrhosis Brother        alcoholic    Colon cancer Neg Hx    Esophageal cancer Neg Hx    Pancreatic cancer Neg Hx    Liver disease Neg Hx    Colon polyps Neg Hx    Rectal cancer Neg Hx    Stomach cancer Neg Hx     Past Surgical History:  Procedure Laterality Date   ELECTROCARDIOGRAM  09/20/06   GANGLION CYST EXCISION Right 09/20/2015   Procedure: REMOVAL GANGLION OF WRIST;  Surgeon: Lynwood FORBES Better, MD;  Location: [facility] OR;  Service: Orthopedics;  Laterality: Right;   KNEE ARTHROSCOPY Left    x 2, then partial replacement   LUMBAR LAMINECTOMY Right 09/20/2015   Procedure: FAR LATERAL APPROACH TO EXCISE HERNIATED NUCLEUS PULPOSUS RIGHT Lumbar 1- Lumbar 2 AND RIGHT Lumbar 2-Lumbar 3, EXCISION OF GANGLION CYST RIGHT VOLAR RADIAL WRIST;  Surgeon: Lynwood FORBES Better, MD;  Location: [facility] OR;   Service: Orthopedics;  Laterality: Right;   MEDIAL PARTIAL KNEE REPLACEMENT Left    PTCA     ROTATOR CUFF REPAIR     2R; 3 L   stress cardiolite  12/15/05   TRANSTHORACIC ECHOCARDIOGRAM  07/13/06   Social History   Occupational History   Occupation: Disability  Tobacco Use   Smoking status: Never  Smokeless tobacco: Never   Tobacco comments:    denies   Vaping Use   Vaping status: Never Used  Substance and Sexual Activity   Alcohol use: Yes    Alcohol/week: 6.0 standard drinks of alcohol    Types: 6 Shots of liquor per week    Comment: weekends   Drug use: No    Comment: Smoked marijuana on occasion in distant past.    Sexual activity: Not Currently

## 2023-12-04 ENCOUNTER — Encounter (HOSPITAL_BASED_OUTPATIENT_CLINIC_OR_DEPARTMENT_OTHER): Payer: Self-pay | Admitting: Physician Assistant

## 2023-12-17 ENCOUNTER — Ambulatory Visit
Admission: RE | Admit: 2023-12-17 | Discharge: 2023-12-17 | Disposition: A | Discharge status: Home / Self Care | Source: Ambulatory Visit | Attending: Physician Assistant | Admitting: Physician Assistant

## 2023-12-17 DIAGNOSIS — M542 Cervicalgia: Secondary | ICD-10-CM

## 2023-12-18 ENCOUNTER — Ambulatory Visit: Admitting: Podiatry

## 2023-12-18 ENCOUNTER — Encounter: Payer: Self-pay | Admitting: Podiatry

## 2023-12-18 DIAGNOSIS — L600 Ingrowing nail: Secondary | ICD-10-CM | POA: Diagnosis not present

## 2023-12-19 NOTE — Progress Notes (Signed)
 Subjective:  Patient ID: [name] [name], male    DOB: 1950-08-03,  MRN: [id number] HPI Chief Complaint  Patient presents with   Toe Pain    5th toe left - lateral border, tender x 4-5 days ago, tried trimming, but has made it sore, tried some lanocaine ointment, also mentions he has burning in both feet worse at night   New Patient (Initial Visit)    73 y.o. male presents with the above complaint.   ROS: Denies fever chills nausea vomit muscle aches pains.  Past Medical History:  Diagnosis Date   Allergic rhinitis    Allergy    Anxiety    Asthma    Probable mild intermittent asthma   Cataract    CHF (congestive heart failure) (HCC)    CKD (chronic kidney disease)    Diverticulosis    a. Colonoscopy (1/12) with mild diverticulosis and hemorrhoids   Elevated LFTs    HCV negative, possibly due to ETOH   GERD (gastroesophageal reflux disease)    EGD 1/12 with erosive esophagitis and gastritis, biopsy postiive for H pylori (being  treated)   Gynecomastia    with spironolactone  (ok on eplerenone )   History of back surgery    HTN (hypertension)    Hypertriglyceridemia    Knee osteoarthritis    s/p arhtroscopy 2010   Nonischemic cardiomyopathy (HCC)    LHC 06/2002 with normal cors; Myoview 11/08 neg for ischemia. Echo 4/08: EF of 35-40%.  Cause of NICM unknown.  Never a heavy drinker, used cocaine or amphetamines. HIV, SPEP, and ferritin/Fe studies were all negative in 12/09. Myoview (5/11): EF 42% with apical thinning but no evidence for ischemia or infarction. Echo (3/12): EF 45-50%, no significant valvular abnormalities.    Rotator cuff tendonitis    with hx of bilateral shoulder surgery   Sleep apnea    doesnot use cpap   Past Surgical History:  Procedure Laterality Date   ELECTROCARDIOGRAM  09/20/06   GANGLION CYST EXCISION Right 09/20/2015   Procedure: REMOVAL GANGLION OF WRIST;  Surgeon: Lynwood FORBES Better, MD;  Location: [facility] OR;  Service: Orthopedics;  Laterality: Right;    KNEE ARTHROSCOPY Left    x 2, then partial replacement   LUMBAR LAMINECTOMY Right 09/20/2015   Procedure: FAR LATERAL APPROACH TO EXCISE HERNIATED NUCLEUS PULPOSUS RIGHT Lumbar 1- Lumbar 2 AND RIGHT Lumbar 2-Lumbar 3, EXCISION OF GANGLION CYST RIGHT VOLAR RADIAL WRIST;  Surgeon: Lynwood FORBES Better, MD;  Location: [facility] OR;  Service: Orthopedics;  Laterality: Right;   MEDIAL PARTIAL KNEE REPLACEMENT Left    PTCA     ROTATOR CUFF REPAIR     2R; 3 L   stress cardiolite  12/15/05   TRANSTHORACIC ECHOCARDIOGRAM  07/13/06    Current Outpatient Medications:    Biotin 10000 MCG TABS, Take 1 tablet by mouth daily., Disp: , Rfl:    carvedilol  (COREG ) 12.5 MG tablet, TAKE 1 TABLET BY MOUTH 2 TIMES A DAY, Disp: 180 tablet, Rfl: 1   cetirizine  (ZYRTEC ) 10 MG tablet, Take 10 mg by mouth daily as needed for allergies or rhinitis., Disp: , Rfl:    ENTRESTO  97-103 MG, TAKE 1 TABLET BY MOUTH TWICE A DAY, Disp: 180 tablet, Rfl: 3   eplerenone  (INSPRA ) 50 MG tablet, TAKE 1 TABLET BY MOUTH DAILY, Disp: 90 tablet, Rfl: 0   escitalopram (LEXAPRO) 20 MG tablet, Take 20 mg by mouth at bedtime., Disp: , Rfl:    FINASTERIDE PO, Take 1 mg by mouth daily., Disp: ,  Rfl:    furosemide  (LASIX ) 20 MG tablet, Take 1 tablet (20 mg total) by mouth as needed for edema., Disp: 30 tablet, Rfl: 11   HYDROmorphone  (DILAUDID ) 2 MG tablet, Take 1 tablet (2 mg total) by mouth every 4 (four) hours as needed for pain. Do not drink alcohol., Disp: 180 tablet, Rfl: 0   HYDROmorphone  (DILAUDID ) 2 MG tablet, Take 1 tablet (2 mg total) by mouth every 4 (four) hours as needed for pain. do not drink alcohol. use CPAP., Disp: 180 tablet, Rfl: 0   hydrOXYzine (ATARAX/VISTARIL) 25 MG tablet, Take 25 mg by mouth daily in the afternoon., Disp: , Rfl:    pantoprazole  (PROTONIX ) 40 MG tablet, Take 1 tablet (40 mg total) by mouth daily., Disp: 90 tablet, Rfl: 3   pregabalin  (LYRICA ) 100 MG capsule, Take 1 capsule (100 mg total) by mouth 2 (two) times daily., Disp:  180 capsule, Rfl: 3   rosuvastatin  (CRESTOR ) 40 MG tablet, Take 1 tablet by mouth once daily, Disp: 90 tablet, Rfl: 3   sildenafil (VIAGRA) 100 MG tablet, Take 100 mg by mouth as needed for erectile dysfunction. , Disp: , Rfl:   No Known Allergies Review of Systems Objective:  There were no vitals filed for this visit.  General: Well developed, nourished, in no acute distress, alert and oriented x3   Dermatological: Skin is warm, dry and supple bilateral. Nails x 10 are well maintained; remaining integument appears unremarkable at this time. There are no open sores, no preulcerative lesions, no rash or signs of infection present.  Fifth digit nail plate left appears to be ripped from its lateral margin.  There is mild erythema no cellulitis drainage or odor this appears to be healing with there is no nail left.  Vascular: Dorsalis Pedis artery and Posterior Tibial artery pedal pulses are 2/4 bilateral with immedate capillary fill time. Pedal hair growth present. No varicosities and no lower extremity edema present bilateral.   Neruologic: Grossly intact via light touch bilateral. Vibratory intact via tuning fork bilateral. Protective threshold with Semmes Wienstein monofilament intact to all pedal sites bilateral. Patellar and Achilles deep tendon reflexes 2+ bilateral. No Babinski or clonus noted bilateral.   Musculoskeletal: No gross boney pedal deformities bilateral. No pain, crepitus, or limitation noted with foot and ankle range of motion bilateral. Muscular strength 5/5 in all groups tested bilateral.  Gait: Unassisted, Nonantalgic.    Radiographs:  None taken  Assessment & Plan:   Assessment: Neuropathy.  Painful fifth nail plate laterally.  Plan: Recommended that he follow-up with me in a month or so after allowing this to grow out again.  At that point we can perform an AP nail procedure.  This would be to the lateral border of fifth digit left foot.  Continue to take the  Lyrica  that he is currently on for the neuropathy.     [name] [name], [place]

## 2023-12-28 ENCOUNTER — Other Ambulatory Visit (HOSPITAL_COMMUNITY): Payer: Self-pay | Admitting: Cardiology

## 2023-12-31 ENCOUNTER — Telehealth (HOSPITAL_COMMUNITY): Payer: Self-pay | Admitting: Cardiology

## 2024-01-16 ENCOUNTER — Other Ambulatory Visit (HOSPITAL_COMMUNITY): Payer: Self-pay | Admitting: Cardiology

## 2024-02-11 ENCOUNTER — Encounter: Payer: Self-pay | Admitting: Radiology

## 2024-02-26 ENCOUNTER — Ambulatory Visit: Admitting: Podiatry

## 2024-03-05 ENCOUNTER — Other Ambulatory Visit (HOSPITAL_COMMUNITY): Payer: Self-pay | Admitting: Cardiology

## 2024-03-05 ENCOUNTER — Other Ambulatory Visit: Payer: Self-pay | Admitting: Physician Assistant

## 2024-03-10 ENCOUNTER — Other Ambulatory Visit (HOSPITAL_COMMUNITY): Payer: Self-pay | Admitting: Cardiology

## 2024-03-13 ENCOUNTER — Ambulatory Visit (HOSPITAL_COMMUNITY)
Admission: RE | Admit: 2024-03-13 | Discharge: 2024-03-13 | Disposition: A | Source: Ambulatory Visit | Attending: Cardiology | Admitting: Cardiology

## 2024-03-13 ENCOUNTER — Telehealth (HOSPITAL_COMMUNITY): Payer: Self-pay | Admitting: Cardiology

## 2024-03-13 DIAGNOSIS — I5020 Unspecified systolic (congestive) heart failure: Secondary | ICD-10-CM

## 2024-03-13 LAB — ECHOCARDIOGRAM COMPLETE
Area-P 1/2: 2.62 cm2
Calc EF: 65.3 %
MV M vel: 3.6 m/s
MV Peak grad: 51.8 mmHg
S' Lateral: 3.9 cm
Single Plane A2C EF: 70.6 %
Single Plane A4C EF: 60 %

## 2024-03-13 NOTE — Progress Notes (Signed)
  Echocardiogram 2D Echocardiogram has been performed.  Koleen KANDICE Popper, RDCS 03/13/2024, 3:30 PM

## 2024-03-13 NOTE — Telephone Encounter (Signed)
 Called to confirm/remind patient of their appointment at the [facility] on 03/13/2024.   Appointment:   [x] Confirmed  [] Left mess   [] No answer/No voice mail  [] VM Full/unable to leave message  [] Phone not in service  Patient reminded to bring all medications and/or complete list.  Confirmed patient has transportation. Gave directions, instructed to utilize valet parking.

## 2024-03-14 ENCOUNTER — Encounter (HOSPITAL_COMMUNITY): Payer: Self-pay | Admitting: Cardiology

## 2024-03-14 ENCOUNTER — Ambulatory Visit (HOSPITAL_COMMUNITY)
Admission: RE | Admit: 2024-03-14 | Discharge: 2024-03-14 | Disposition: A | Source: Ambulatory Visit | Attending: Cardiology | Admitting: Cardiology

## 2024-03-14 VITALS — BP 110/64 | HR 63 | Ht 67.5 in | Wt 200.8 lb

## 2024-03-14 DIAGNOSIS — I5020 Unspecified systolic (congestive) heart failure: Secondary | ICD-10-CM

## 2024-03-14 DIAGNOSIS — G4733 Obstructive sleep apnea (adult) (pediatric): Secondary | ICD-10-CM

## 2024-03-14 LAB — BASIC METABOLIC PANEL WITH GFR
Anion gap: 5 (ref 5–15)
BUN: 12 mg/dL (ref 8–23)
CO2: 27 mmol/L (ref 22–32)
Calcium: 8.7 mg/dL — ABNORMAL LOW (ref 8.9–10.3)
Chloride: 107 mmol/L (ref 98–111)
Creatinine, Ser: 1.31 mg/dL — ABNORMAL HIGH (ref 0.61–1.24)
GFR, Estimated: 58 mL/min — ABNORMAL LOW (ref >60)
Glucose, Bld: 104 mg/dL — ABNORMAL HIGH (ref 70–99)
Potassium: 4.1 mmol/L (ref 3.5–5.1)
Sodium: 139 mmol/L (ref 135–145)

## 2024-03-14 LAB — LIPID PANEL
Cholesterol: 123 mg/dL (ref 0–200)
HDL: 36 mg/dL — ABNORMAL LOW (ref >40)
LDL Cholesterol: 62 mg/dL (ref 0–99)
Total CHOL/HDL Ratio: 3.4 ratio
Triglycerides: 126 mg/dL (ref <150)
VLDL: 25 mg/dL (ref 0–40)

## 2024-03-14 NOTE — Patient Instructions (Signed)
 There has been no changes to your medications.  Labs done today, your results will be available in MyChart, we will contact you for abnormal readings.  You have been referred [facility]. They will call you to arrange your appointment.  Your physician recommends that you schedule a follow-up appointment in: 6 months.  If you have any questions or concerns before your next appointment please send us  a message through [place] or call our office at [phone].    TO LEAVE A MESSAGE FOR THE NURSE SELECT OPTION 2, PLEASE LEAVE A MESSAGE INCLUDING: YOUR NAME DATE OF BIRTH CALL BACK NUMBER REASON FOR CALL**this is important as we prioritize the call backs  YOU WILL RECEIVE A CALL BACK THE SAME DAY AS LONG AS YOU CALL BEFORE 4:00 PM  At the [facility], you and your health needs are our priority. As part of our continuing mission to provide you with exceptional heart care, we have created designated Provider Care Teams. These Care Teams include your primary Cardiologist (physician) and Advanced Practice Providers (APPs- Physician Assistants and Nurse Practitioners) who all work together to provide you with the care you need, when you need it.   You may see any of the following providers on your designated Care Team at your next follow up: Dr Toribio Fuel Dr Ezra Shuck Dr. Morene Brownie Greig Mosses, NP Caffie Shed, [place] [facility] [place], [place] Beckey Coe, NP Jordan Lee, NP Ellouise Class, NP Tinnie Redman, PharmD Jaun Bash, PharmD   Please be sure to bring in all your medications bottles to every appointment.    Thank you for choosing [facility]

## 2024-03-15 NOTE — Progress Notes (Signed)
 Patient ID: [name] [name], male   DOB: Dec 03, 1950, 73 y.o.   MRN: [id number] Pulmonary: Dr Jude PCP: Silvano Angeline FALCON, NP Cardiology: Dr. Rolan  Chief complaint: CHF  73 y.o. with history of nonischemic cardiomyopathy presents for followup of CHF.  Echo in 12/20 showed EF 40-45%.  This is mildly lower than prior.  He had been drinking more.   Echo in 4/22 showed EF up to 50-55%, normal RV.  Patient had [employer] gene testing for cardiomyopathy given brother with NICM and ICD at young age.  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   Echo 11/24 EF 50-55%, normal RV, mild MR. Echo in 12/25 showed EF 50-55%, mild LV dilation, mild RV dilation with normal RV systolic function, IVC normal.   Today he returns for HF follow up. Weight is up about 2 lbs.  He still goes to the gym 4-5 days/week.  Not drinking liquor any longer, occasional drinks wine.  Chest feels full at times, no exertional chest discomfort.  He rides exercise bike 1 mile daily.  No significant exertional dyspnea, no orthopnea/PND.    ECG (personally reviewed): NSR, RBBB  Labs (12/09): SPEP negative, HIV negative, transferrin saturation 29%, BNP 24 Labs (10/23): BNP 8.8, K 4, creatinine 1.21 Labs (10/24): LDL 52 Labs (3/25): K 4.2, creatinine 1.04 Labs (6/25): BNP 5.8, creatinine 1.32, K 3.6  Past Medical History: 1. Nonischemic cardiomyopathy.  The patient had a left heart catheterization done in March 2004 with normal coronaries and then in November 2008, he had a Myoview done that showed an EF of 39% with no ischemia.  Echo in April 2008 showed an EF of 35-40%, moderate diffuse hypokinesis, mild left atrial enlargement, normal RV size and function and essentially normal valves.  The cause of his cardiomyopathy has not been discovered. He drinks but not extremely heavily.  He has never used cocaine and amphetamines.  HIV, SPEP, and  ferritin/Fe studies were all negative in 12/09.  Echo (9/10): EF 40%, mild to moderate global HK, mild LAE.  Myoview (5/11): EF 42% with apical thinning but no evidence for ischemia or infarction.  Echo (3/12): EF 45-50%, no significant valvular abnormalities. Echo (1/16) with EF 35-40%, diffuse hypokinesis.  - Cardiac MRI (3/17) with EF 43%, mild diffuse hypokinesis, mid-wall LGE in the mid septum, possible prior viral myocarditis.  - Echo (10/18): EF 50-55%, normal RV size and systolic function.  - Echo (12/20): EF 40-45%, normal RV.  - Echo (4/22): EF 50-55%, normal RV.  - Echo (11/24): EF 50-55%, normal RV, mild MR. 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  - Echo (12/25): EF 50-55%, mild LV dilation, mild RV dilation with normal RV systolic function, IVC normal.  2. Hypertriglyceridemia 3. Rotator cuff tendonitis with a history of bilateral shoulder surgery. 4. Hypertension. 5. History of a back surgery. 6. Knee osteoarthritis, s/p arthroscopy 2010 7. Gastroesophageal reflux disease.  EGD (1/12) with erosive esophagitis and gastritis, biopsy positive for H pylori (treated). 8. Colonoscopy (1/12) with mild diverticulosis and hemorrhoids.  9. Allergic rhinitis 10. Gynecomastia with spironolactone  (ok on eplerenone ).  11. Probable mild intermittent asthma 12. Elevated LFTs: HCV negative, possibly due to ETOH 13. CKD 14. OSA: has CPAP.  15. H/o herniated nucleus pulposus: s/p surgery 6/17. 16. Presyncope: Zio monitor in 10/23 with 2.1% PVCs, no VT.  17. Melanoma: Removed  from chest in 2025.   Family History: No premature CAD.  Father with prostate cancer Mother with CVA Brother with nonischemic cardiomyopathy, has ICD Grandmother with cancer (unsure what)   Son with MI at 52  Social History: The patient denies smoking.  Lives in [place].  He does not use any drugs. He has never used any illicit  drugs other than marijuana, which he smoked occasionally in the distant past.  Moderate ETOH. He is on disability. He is divorced with two sons.     ROS:  All systems reviewed and negative except as per HPI.   Current Outpatient Medications  Medication Sig Dispense Refill   Biotin 89999 MCG TABS Take 1 tablet by mouth daily.     carvedilol  (COREG ) 12.5 MG tablet TAKE 1 TABLET BY MOUTH 2 TIMES A DAY 180 tablet 1   cetirizine  (ZYRTEC ) 10 MG tablet Take 10 mg by mouth daily as needed for allergies or rhinitis.     ENTRESTO  97-103 MG TAKE 1 TABLET BY MOUTH 2 TIMES A DAY 180 tablet 3   eplerenone  (INSPRA ) 50 MG tablet TAKE 1 TABLET BY MOUTH DAILY 90 tablet 0   escitalopram (LEXAPRO) 20 MG tablet Take 20 mg by mouth at bedtime.     FINASTERIDE PO Take 1 mg by mouth daily.     furosemide  (LASIX ) 20 MG tablet Take 1 tablet (20 mg total) by mouth as needed for edema. 30 tablet 11   HYDROmorphone  (DILAUDID ) 2 MG tablet Take 1 tablet (2 mg total) by mouth every 4 (four) hours as needed for pain. Do not drink alcohol. 180 tablet 0   hydrOXYzine (ATARAX/VISTARIL) 25 MG tablet Take 25 mg by mouth daily in the afternoon.     pantoprazole  (PROTONIX ) 40 MG tablet TAKE 1 TABLET BY MOUTH DAILY 90 tablet 3   pregabalin  (LYRICA ) 100 MG capsule Take 1 capsule (100 mg total) by mouth 2 (two) times daily. 180 capsule 3   rosuvastatin  (CRESTOR ) 40 MG tablet TAKE 1 TABLET BY MOUTH DAILY 90 tablet 3   sildenafil (VIAGRA) 100 MG tablet Take 100 mg by mouth as needed for erectile dysfunction.      No current facility-administered medications for this encounter.   Wt Readings from Last 3 Encounters:  03/14/24 91.1 kg (200 lb 12.8 oz)  10/02/23 89.8 kg (198 lb)  03/07/23 88.9 kg (196 lb)   BP 110/64   Pulse 63   Ht 5' 7.5 (1.715 m)   Wt 91.1 kg (200 lb 12.8 oz)   SpO2 96%   BMI 30.99 kg/m   Physical Exam: General: NAD Neck: No JVD, no thyromegaly or thyroid  nodule.  Lungs: Clear to auscultation bilaterally  with normal respiratory effort. CV: Nondisplaced PMI.  Heart regular S1/S2, no S3/S4, no murmur.  No peripheral edema.  No carotid bruit.  Normal pedal pulses.  Abdomen: Soft, nontender, no hepatosplenomegaly, no distention.  Skin: Intact without lesions or rashes.  Neurologic: Alert and oriented x 3.  Psych: Normal affect. Extremities: No clubbing or cyanosis.  HEENT: Normal.   Assessment/Plan: 1. Chronic HF with mid range EF: Nonischemic cardiomyopathy, ECHO 04/2014 with EF down to 35-40%.  Cardiac MRI in 3/17, however, showed EF 43% with mid-wall pattern of LGE in the septum that may be suggestive of prior myocarditis.  Echo in 10/18 showed EF up to 50-55. HIV negative, SPEP negative, no evidence for hemochromatosis. Would also consider familial cardiomyopathy as his brother had cardiomyopathy with SCD at a young age, says  his mother's family has multiple members with CHF => [employer] gene testing showed titin gene mutation of uncertain significance.  Titin gene mutations can be associated with autosomal dominant dilated cardiomyopathy but significance of this particular mutation is unknown.  Echo in 12/20 showed EF down to 40-45%.  Echo in 4/22 showed EF back up to 50-55% (he has cut back on ETOH).  Echo 11/24 and again in 12/25 showed EF 50-55%. NYHA class I, not volume overloaded on exam.  - Continue to keep ETOH at a minimum.  - Continue eplerenone  50 mg daily, BMET today.  - Continue carvedilol  12.5 mg bid - Continue Entresto  97/103 bid.  - He does not need Lasix .  - He stopped Farxiga  due to a yeast infection, will stay off.  - He decided against meeting our genetic counselor given titin mutation.  2. Hyperlipidemia: He is on Crestor  40 mg daily. Check lipids today.  3. OSA: Unable to tolerate CPAP.  4. CP: Occasional atypical chest pain.    Follow up in 6 months with APP.  I spent 31 minutes reviewing records, interviewing/examining patient, and managing orders.    Ezra Shuck,   03/15/2024

## 2024-03-16 ENCOUNTER — Ambulatory Visit (HOSPITAL_COMMUNITY): Payer: Self-pay | Admitting: Cardiology

## 2024-03-26 ENCOUNTER — Other Ambulatory Visit (HOSPITAL_COMMUNITY): Payer: Self-pay | Admitting: Cardiology

## 2024-05-08 NOTE — Progress Notes (Unsigned)
 Patient ID: [name] [name]                 DOB: [birth year]                    MRN: [id number]     HPI: [name] [name] is a 74 y.o. male patient referred to pharmacy clinic by Dr.Mclean to initiate GLP1-RA therapy. PMH is significant for nonischemic cardiomyopathy, hypertriglyceridemia, HTN, CKD, OSA (unable to tolerate CPAP) and obesity (AHI 29). Most recent BMI 31.3 kg/m .  Baseline weight and BMI: 200lbs and 31.1 kg/m  Current weight and BMI: 190lbs and 29.8 kg/m  Current meds that affect weight: Eplerenone   Most Recent BP: 110/64 Most recent A1c: N/A  Diet:  B:  L: D: Snacks:  Drinks:   Exercise:   Family History:  Relation Problem Comments  Mother Stroke     Father Prostate cancer     Sister Pancreatitis     Brother Cardiomyopathy nonischemic, has icd  Cirrhosis alcoholic     Social History:  Alcohol:  Smoking:   Labs: No results found for: HGBA1C  Wt Readings from Last 1 Encounters:  03/14/24 200 lb 12.8 oz (91.1 kg)    BP Readings from Last 1 Encounters:  03/14/24 110/64   Pulse Readings from Last 1 Encounters:  03/14/24 63       Component Value Date/Time   CHOL 123 03/14/2024 1028   TRIG 126 03/14/2024 1028   HDL 36 (L) 03/14/2024 1028   CHOLHDL 3.4 03/14/2024 1028   VLDL 25 03/14/2024 1028   LDLCALC 62 03/14/2024 1028   LDLDIRECT 169.0 04/24/2014 9092    Past Medical History:  Diagnosis Date   Allergic rhinitis    Allergy    Anxiety    Asthma    Probable mild intermittent asthma   Cataract    CHF (congestive heart failure) (HCC)    CKD (chronic kidney disease)    Diverticulosis    a. Colonoscopy (1/12) with mild diverticulosis and hemorrhoids   Elevated LFTs    HCV negative, possibly due to ETOH   GERD (gastroesophageal reflux disease)    EGD 1/12 with erosive esophagitis and gastritis, biopsy postiive for H pylori (being  treated)   Gynecomastia    with spironolactone  (ok on eplerenone )   History of back surgery    HTN  (hypertension)    Hypertriglyceridemia    Knee osteoarthritis    s/p arhtroscopy 2010   Nonischemic cardiomyopathy (HCC)    LHC 06/2002 with normal cors; Myoview 11/08 neg for ischemia. Echo 4/08: EF of 35-40%.  Cause of NICM unknown.  Never a heavy drinker, used cocaine or amphetamines. HIV, SPEP, and ferritin/Fe studies were all negative in 12/09. Myoview (5/11): EF 42% with apical thinning but no evidence for ischemia or infarction. Echo (3/12): EF 45-50%, no significant valvular abnormalities.    Rotator cuff tendonitis    with hx of bilateral shoulder surgery   Sleep apnea    doesnot use cpap    Medications Ordered Prior to Encounter[1]  Allergies[2]   Assessment/Plan:  1. Weight loss -   No problems updated. No problem-specific Assessment & Plan notes found for this encounter. Patient has not met goal of at least 5% of body weight loss with comprehensive lifestyle modifications alone in the past 3-6 months. Pharmacotherapy is appropriate to pursue as augmentation. Will start*** . Confirmed patient not ***pregnant and no personal or family history of medullary thyroid  carcinoma (MTC) or Multiple  Endocrine Neoplasia syndrome type 2 (MEN 2). Injection technique reviewed at today's visit.  Advised patient on common side effects including nausea, diarrhea, dyspepsia, decreased appetite, and fatigue. Counseled patient on reducing meal size and how to titrate medication to minimize side effects. Counseled patient to call if intolerable side effects or if experiencing dehydration, abdominal pain, or dizziness. Along with pharmacotherapy, the patient will follow dietary modifications and aim for at least 150 minutes of moderate-intensity exercise per week, plus resistance training twice a week (as recommended by the American Heart Association). This resistance training--such as weightlifting, bodyweight exercises, or using resistance bands, adapted to the patients ability--will help prevent  muscle loss.  Follow up in 1-2 days regarding coverage of *** . If therapy is initiated, phone follow-ups will be conducted every 4 weeks for dose titration until the patient reaches the effective therapeutic dose and target weight.    Robbi Blanch, Pharm.D [place] [facility]. [facility] [address], [place], [place] [address] Phone: [phone]; Fax: [phone]      [1]  Current Outpatient Medications on File Prior to Visit  Medication Sig Dispense Refill   Biotin 10000 MCG TABS Take 1 tablet by mouth daily.     carvedilol  (COREG ) 12.5 MG tablet TAKE 1 TABLET BY MOUTH 2 TIMES A DAY 180 tablet 1   cetirizine  (ZYRTEC ) 10 MG tablet Take 10 mg by mouth daily as needed for allergies or rhinitis.     ENTRESTO  97-103 MG TAKE 1 TABLET BY MOUTH 2 TIMES A DAY 180 tablet 3   eplerenone  (INSPRA ) 50 MG tablet TAKE 1 TABLET BY MOUTH DAILY 90 tablet 0   escitalopram (LEXAPRO) 20 MG tablet Take 20 mg by mouth at bedtime.     FINASTERIDE PO Take 1 mg by mouth daily.     furosemide  (LASIX ) 20 MG tablet Take 1 tablet (20 mg total) by mouth as needed for edema. 30 tablet 11   HYDROmorphone  (DILAUDID ) 2 MG tablet Take 1 tablet (2 mg total) by mouth every 4 (four) hours as needed for pain. Do not drink alcohol. 180 tablet 0   hydrOXYzine (ATARAX/VISTARIL) 25 MG tablet Take 25 mg by mouth daily in the afternoon.     pantoprazole  (PROTONIX ) 40 MG tablet TAKE 1 TABLET BY MOUTH DAILY 90 tablet 3   pregabalin  (LYRICA ) 100 MG capsule Take 1 capsule (100 mg total) by mouth 2 (two) times daily. 180 capsule 3   rosuvastatin  (CRESTOR ) 40 MG tablet TAKE 1 TABLET BY MOUTH DAILY 90 tablet 3   sildenafil (VIAGRA) 100 MG tablet Take 100 mg by mouth as needed for erectile dysfunction.      No current facility-administered medications on file prior to visit.  [2] No Known Allergies

## 2024-05-09 ENCOUNTER — Telehealth: Payer: Self-pay | Admitting: Pharmacist

## 2024-05-09 ENCOUNTER — Ambulatory Visit: Attending: Pharmacist | Admitting: Pharmacist

## 2024-05-09 NOTE — Telephone Encounter (Signed)
 Patient no showed for Jan 30 appointment

## 2024-05-12 ENCOUNTER — Other Ambulatory Visit (HOSPITAL_COMMUNITY): Payer: Self-pay

## 2024-05-12 ENCOUNTER — Telehealth: Payer: Self-pay | Admitting: Pharmacy Technician

## 2024-05-12 NOTE — Telephone Encounter (Signed)
 Pharmacy Patient Advocate Encounter  [id number]    Received notification from [facility] that Prior Authorization for zepbound has been DENIED.  See denial reason below. No denial letter attached in CMM. Will attach denial letter to Media tab once received.    PA #/Case ID/Reference #: drug not covered by plan/not covered by part d law

## 2024-06-26 ENCOUNTER — Ambulatory Visit: Admitting: Pharmacist

## 2024-09-12 ENCOUNTER — Ambulatory Visit (HOSPITAL_COMMUNITY)
